# Patient Record
Sex: Male | Born: 1970
Health system: Southern US, Community
[De-identification: ages and names within clinical notes are randomized; demographics above are authoritative.]

## PROBLEM LIST (undated history)

## (undated) DIAGNOSIS — G4730 Sleep apnea, unspecified: Secondary | ICD-10-CM

## (undated) DIAGNOSIS — H469 Unspecified optic neuritis: Secondary | ICD-10-CM

## (undated) DIAGNOSIS — G35 Multiple sclerosis: Principal | ICD-10-CM

## (undated) HISTORY — PX: APPENDECTOMY: SHX54

## (undated) HISTORY — DX: Unspecified optic neuritis: H46.9

## (undated) HISTORY — DX: Sleep apnea, unspecified: G47.30

## (undated) HISTORY — DX: Multiple sclerosis: G35

---

## 2005-04-21 DIAGNOSIS — G35 Multiple sclerosis: Secondary | ICD-10-CM

## 2005-04-21 HISTORY — DX: Multiple sclerosis: G35

## 2012-07-22 ENCOUNTER — Telehealth: Payer: Self-pay

## 2012-07-22 NOTE — Telephone Encounter (Signed)
RX not working Called him to come in for apt.

## 2012-07-23 ENCOUNTER — Ambulatory Visit: Payer: Self-pay | Admitting: Neurology

## 2012-07-26 ENCOUNTER — Encounter: Payer: Self-pay | Admitting: Neurology

## 2012-07-26 ENCOUNTER — Ambulatory Visit (INDEPENDENT_AMBULATORY_CARE_PROVIDER_SITE_OTHER): Payer: PRIVATE HEALTH INSURANCE | Admitting: Neurology

## 2012-07-26 VITALS — BP 111/60 | HR 70 | Ht 71.5 in | Wt 186.0 lb

## 2012-07-26 DIAGNOSIS — G35D Multiple sclerosis, unspecified: Secondary | ICD-10-CM

## 2012-07-26 DIAGNOSIS — R269 Unspecified abnormalities of gait and mobility: Secondary | ICD-10-CM

## 2012-07-26 DIAGNOSIS — G35 Multiple sclerosis: Secondary | ICD-10-CM

## 2012-07-26 MED ORDER — BACLOFEN 10 MG PO TABS: 10.0000 mg | ORAL_TABLET | Freq: Three times a day (TID) | ORAL | Status: DC

## 2012-07-26 MED ORDER — SOLIFENACIN SUCCINATE 5 MG PO TABS: 5.0000 mg | ORAL_TABLET | Freq: Every day | ORAL | Status: DC

## 2012-07-26 NOTE — Progress Notes (Signed)
History of Present Illness  HPI: Mr. Dobrowski is a 42 years old right-handed African American male, accompanied by his wife, referred by his primary care physician  for evaluation of relapsing and meeting multiple sclerosis    He recently moved from [place] to [place], was previously under the care of [facility] specialist Dr. Clabe Seal,  He was diagnosed with relapsing remitting multiple sclerosis in early 2007, presenting with right face, right leg weakness, has mild gait difficulty, diagnosis was confirmed by marked abnormal MRI of brain, there was also reported cervical lesions, but I don't have formal MRI cervical report.  He was initially treated with Betaseron for one and half years, clinically he was stable, but there was enhancing MRI lesions, he developed positive interferon antibodies.  He was started on Tysabri since February 2009, initially every 4 weeks, later stretching to every 6 weeks, JC virus antibody was negative in September 2009 and April 2010, but began to be positive since September 2010, he tolerates the [place] infusion very well, Last infusion was in Jan 2013. He has repeat MRI of brain every 6 months.  I have reviewed the most recent MRI brain in December 2012, multiple oval-shaped supratentorium lesions, T1 blackhole, no contrast enhancement.  Mild to moderate generalized atrophy  Laboratory showed normal CMP, CBC, vitamin D was low at 16,  He dragged his right leg with prolonged walking, or when he was tired, he denies incontinence, no visual loss, last clinical flareup  was in 2008     He began to have flareup Jan 2014, with worsening right leg weakness, gait difficulty, has improved with steroid treatment, he continued to work as a Copy, mild gait difficulty, falling sometimes,   He was started on [name] since January 24th, 2014, continued to complains of worsening gait difficulty, more difficulty on his right side, hard to keep up his job as a  Copy, had fell. Medrol pack in Feb 2014.  MRI brain [facility], showed chronic demyelinating disease, with numerous supratentorial and infratentorial plaques, T1 black-holes and moderate corpus callosum atrophy. Multiple (~8) acute demyelinating plaques.  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 I have reviewd MRI film with him and his wife  Abnormal visual evoked response test secondary to prolongation of the P100 waveform on the left.    UPDATE April 7th 2014:  He came in with his wife today, continue to complain of gradual worsening gait difficulty, bilateral lower extremity spasticity, also complains of urinary incontinence for 3 weeks, no visual change, I have discussed with patient and his wife again, repeat JC virus antibody was positive, will keep him on [name] few more months, repeat MRI in June, if there is imaging worsening, continued clinical worsening, will take the risk to prolonged [name] infusion with JC virus positive   Review of Systems  Out of a complete 14 system review, the patient complains of only the following symptoms, and all other reviewed systems are negative.   Constitutional:   Weight loss, fatigue Cardiovascular:  N/A Ear/Nose/Throat:  N/A Skin: N/A Eyes: Blurry vision, Respiratory: N/A Gastroitestinal: Incontinence   Hematology/Lymphatic:  N/A Endocrine:  N/A Musculoskeletal:N/A Allergy/Immunology: N/A Neurological: Memory loss, weakness, slurred speech Psychiatric:    Not enough sleep, decreased energy  PHYSICAL EXAMINATOINS:  Generalized: In no acute distress  Neck: Supple, no carotid bruits  Cardiac: Regular rate rhythm  Pulmonary: Clear to auscultation bilaterally  Musculoskeletal: No deformity  Neurological examination  Mentation: Alert oriented to time, place, history taking,  and causual conversation  Cranial nerve II-XII: Pupils were equal round reactive to light extraocular movements were full, visual field were full on confrontational test. facial sensation and strength were normal. hearing was intact to finger rubbing bilaterally. Uvula tongue midline.  head turning and shoulder shrug and were normal and symmetric.Tongue protrusion into cheek strength was normal.  Motor: Bilateral lower extremity mild to moderate spasticity, mild bilateral hip flexor weakness  Sensory: Intact to fine touch, pinprick, decreased vibratory sensation, and proprioception at toes.  Coordination: Normal finger to nose, heel-to-shin bilaterally there was no truncal ataxia  Gait: Getting up from chair by pushing up on the chair on, stiff gait, cautious,  Romberg signs: Negative  Deep tendon reflexes: Hyperreflexia,   Assessment and plan: 42 years old [place] American male, with history of relapsing and meeting multiple sclerosis, JC virus positive, previously Tysabri treatment, has been receiving [name] since January 2014, continue mild worsening of gait difficulty, urinary incontinence,  1. Baclofen 10 mg 3 times a day 2.   Vesicare 5mg  qday for urinary urgency, 3. RTC in June 2014

## 2012-07-26 NOTE — Patient Instructions (Addendum)
Call me for MRI brain, cervical w/wo contrast,

## 2012-07-31 ENCOUNTER — Telehealth: Payer: Self-pay

## 2012-07-31 DIAGNOSIS — G35 Multiple sclerosis: Secondary | ICD-10-CM

## 2012-07-31 DIAGNOSIS — R269 Unspecified abnormalities of gait and mobility: Secondary | ICD-10-CM

## 2012-07-31 NOTE — Telephone Encounter (Signed)
Patient calling and wants to do physical therapy at [facility] .because its closer to is home.

## 2012-08-05 NOTE — Telephone Encounter (Signed)
Order has been sent to [facility] head for Physical therapy.

## 2012-08-24 DIAGNOSIS — Z0289 Encounter for other administrative examinations: Secondary | ICD-10-CM

## 2012-09-12 ENCOUNTER — Other Ambulatory Visit: Payer: Self-pay

## 2012-09-12 MED ORDER — BACLOFEN 10 MG PO TABS: 10.0000 mg | ORAL_TABLET | Freq: Three times a day (TID) | ORAL | Status: DC

## 2012-09-12 MED ORDER — SOLIFENACIN SUCCINATE 5 MG PO TABS: 5.0000 mg | ORAL_TABLET | Freq: Every day | ORAL | Status: DC

## 2012-09-15 ENCOUNTER — Telehealth: Payer: Self-pay

## 2012-09-15 MED ORDER — DIMETHYL FUMARATE 240 MG PO CPDR: 240.0000 mg | DELAYED_RELEASE_CAPSULE | Freq: Two times a day (BID) | ORAL | Status: DC

## 2012-09-15 NOTE — Telephone Encounter (Signed)
Message copied by Malachy Moan on Wed Sep 15, 2012  8:42 AM ------      Message from: Warren Lacy A      Created: Tue Sep 14, 2012  5:14 PM      Regarding: Lawrence Lamb       Needs a refill on [name]...[facility]            If any questions please call at [phone] ------

## 2012-10-04 DIAGNOSIS — Z0289 Encounter for other administrative examinations: Secondary | ICD-10-CM

## 2012-10-06 DIAGNOSIS — Z0289 Encounter for other administrative examinations: Secondary | ICD-10-CM

## 2012-10-14 ENCOUNTER — Telehealth: Payer: Self-pay | Admitting: Neurology

## 2012-10-14 NOTE — Telephone Encounter (Signed)
Spoke to patient. Requesting to know the status of his disability forms. Advsd of form turn around time (14 days). Verified forms dropped off on 10/05/12. Pt agreed.

## 2012-10-15 ENCOUNTER — Telehealth: Payer: Self-pay | Admitting: Neurology

## 2012-10-15 DIAGNOSIS — G35 Multiple sclerosis: Secondary | ICD-10-CM

## 2012-10-15 DIAGNOSIS — R269 Unspecified abnormalities of gait and mobility: Secondary | ICD-10-CM

## 2012-10-15 NOTE — Telephone Encounter (Signed)
I have called PT Marchelle Folks at [facility]. He is not making process on his physical therapy, actually  showed more difficulty ambulating.  I ordered MRI brain w/wo contrast, will repeat JC virus antibody titer.  Please contact him for MRI date and come back to clinic to draw Jc virus with titer

## 2012-10-25 NOTE — Telephone Encounter (Signed)
I spoke to patient and told him someone would call him when his MRI brain is scheduled.  Also he will come in on Wednesday to get his blood drawn for JCV antibody test.

## 2012-10-27 ENCOUNTER — Ambulatory Visit (INDEPENDENT_AMBULATORY_CARE_PROVIDER_SITE_OTHER): Payer: PRIVATE HEALTH INSURANCE | Admitting: Neurology

## 2012-10-27 DIAGNOSIS — G35 Multiple sclerosis: Secondary | ICD-10-CM

## 2012-10-27 NOTE — Progress Notes (Signed)
Patient here for blood draw for JCV antibody test.  Under aseptic technique blood drawn from right [facility] with 23g butterfly needle.  Tolerated well.  Guaze applied

## 2012-10-27 NOTE — Patient Instructions (Signed)
Told patient we will call when results are back.

## 2012-10-28 ENCOUNTER — Telehealth: Payer: Self-pay

## 2012-10-28 NOTE — Telephone Encounter (Signed)
I left VM for patient that his forms are ready for pick up

## 2012-11-04 ENCOUNTER — Telehealth: Payer: Self-pay | Admitting: Neurology

## 2012-11-04 NOTE — Telephone Encounter (Signed)
information was passed on to nurse val to call solve.

## 2012-11-05 ENCOUNTER — Other Ambulatory Visit: Payer: Self-pay

## 2012-11-05 MED ORDER — DIMETHYL FUMARATE 240 MG PO CPDR: 240.0000 mg | DELAYED_RELEASE_CAPSULE | Freq: Two times a day (BID) | ORAL | Status: DC

## 2012-11-08 ENCOUNTER — Telehealth: Payer: Self-pay | Admitting: *Deleted

## 2012-11-08 NOTE — Telephone Encounter (Signed)
Calling about form, pt was to p/u and have signed by employer and date by pt.  I faxed to her , Lawrence Lamb at [place] the form and she will have employer sign.

## 2012-11-11 ENCOUNTER — Telehealth: Payer: Self-pay | Admitting: Neurology

## 2012-11-11 NOTE — Telephone Encounter (Signed)
Spoke to patient. Says he was never contacted for MRI. Advised would forward to MRI coordinator. Pt agreed.

## 2012-11-17 ENCOUNTER — Ambulatory Visit (INDEPENDENT_AMBULATORY_CARE_PROVIDER_SITE_OTHER): Payer: PRIVATE HEALTH INSURANCE

## 2012-11-17 DIAGNOSIS — G35 Multiple sclerosis: Secondary | ICD-10-CM

## 2012-11-17 DIAGNOSIS — R269 Unspecified abnormalities of gait and mobility: Secondary | ICD-10-CM

## 2012-11-18 ENCOUNTER — Encounter: Payer: Self-pay | Admitting: Neurology

## 2012-11-18 ENCOUNTER — Ambulatory Visit (INDEPENDENT_AMBULATORY_CARE_PROVIDER_SITE_OTHER): Payer: PRIVATE HEALTH INSURANCE | Admitting: Neurology

## 2012-11-18 ENCOUNTER — Telehealth: Payer: Self-pay

## 2012-11-18 VITALS — BP 126/66 | HR 74 | Ht 71.0 in | Wt 182.0 lb

## 2012-11-18 DIAGNOSIS — R269 Unspecified abnormalities of gait and mobility: Secondary | ICD-10-CM

## 2012-11-18 DIAGNOSIS — G35 Multiple sclerosis: Secondary | ICD-10-CM

## 2012-11-18 MED ORDER — GADOPENTETATE DIMEGLUMINE 469.01 MG/ML IV SOLN: 17.0000 mL | Freq: Once | INTRAVENOUS | Status: AC | PRN

## 2012-11-18 NOTE — Telephone Encounter (Signed)
Patient wants to know when does he stop [name] ? I forgot to ask Dr.Yan at my appointment today.

## 2012-11-18 NOTE — Progress Notes (Signed)
History of Present Illness:  [name] is a 42 years old right-handed African American male, accompanied by his wife, referred by his primary care physician  for evaluation of relapsing and meeting multiple sclerosis    He recently moved from [place] to [place], was previously under the care of [facility] specialist Dr. Clabe Lamb,  He was diagnosed with relapsing remitting multiple sclerosis in early 2007, presenting with right face, right leg weakness, has mild gait difficulty, diagnosis was confirmed by marked abnormal MRI of brain, there was also reported cervical lesions, but I don't have formal MRI cervical report.  He was initially treated with Betaseron for one and half years, clinically he was stable, but there was enhancing MRI lesions, he developed positive interferon antibodies.  He was started on Tysabri since February 2009, initially every 4 weeks, later stretching to every 6 weeks, JC virus antibody was negative in September 2009 and April 2010, but began to be positive since September 2010, he tolerates the [place] infusion very well, Last infusion was in Jan 2013. He has repeat MRI of brain every 6 months. Lawrence Lamb was used from Feb 2009 till Jan 2013).  I have reviewed the most recent MRI brain, in December 2012, multiple oval-shaped supratentorium lesions, T1 blackhole, no contrast enhancement, mild to moderate generalized atrophy  Laboratory showed normal CMP, CBC, vitamin D was low at 16,  He dragged his right leg with prolonged walking, or when he was tired, he denies incontinence, no visual loss, last clinical flareup  was in 2008   He began to have flareup in Jan 2014, with worsening right leg weakness, gait difficulty, has improved with steroid treatment, he continued to work as a Copy, mild gait difficulty, falling sometimes,   He was started on [name] since January 24th, 2014, continued to complains of worsening gait difficulty, more difficulty on his  right side, 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 Medrol pack in Feb 2014.  MRI brain [facility] Image, showed chronic demyelinating disease, with numerous supratentorial and infratentorial plaques, T1 black-holes and moderate corpus callosum atrophy. Multiple (~8) acute demyelinating plaques.  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 I have reviewd MRI film with him and his wife  Abnormal visual evoked response test secondary to prolongation of the P100 waveform on the left.    UPDATE July 31st 2014:  He came in with his wife today, continue to complain of gradual worsening gait difficulty, bilateral lower extremity spasticity, also complains of urinary incontinence for 3 weeks, occasional double vision.   I have discussed with patient and his wife again, repeat JC virus antibody was positive, He has been on [name] since Jan 2014, he has continued clinical worsening, worsening gait difficulty  I have reviewed MRI, I was not able to compare with previous scan, but there was extensive periventricular oval-shaped lesions, I have discussed with him and his wife treatment options, we decided to proceed with [name] infusion, stop [name], he wants second opinion from his previous neurologist in [place].   Review of Systems  Out of a complete 14 system review, the patient complains of only the following symptoms, and all other reviewed systems are negative.   Constitutional:   Weight loss, fatigue Cardiovascular:  N/A Ear/Nose/Throat:  N/A Skin: N/A Eyes: Blurry vision, Respiratory: N/A Gastroitestinal:  Incontinence   Hematology/Lymphatic:  N/A Endocrine:  N/A Musculoskeletal:N/A Allergy/Immunology: N/A Neurological: Memory loss, weakness, slurred speech Psychiatric:    Not enough sleep, decreased energy  PHYSICAL  EXAMINATOINS:  Generalized: In no acute distress  Neck: Supple, no carotid bruits   Cardiac: Regular rate rhythm  Pulmonary: Clear to auscultation bilaterally  Musculoskeletal: No deformity  Neurological examination  Mentation: Alert oriented to time, place, history taking, and causual conversation  Cranial nerve II-XII: Pupils were equal round reactive to light extraocular movements were full, visual field were full on confrontational test. facial sensation and strength were normal. hearing was intact to finger rubbing bilaterally. Uvula tongue midline.  head turning and shoulder shrug and were normal and symmetric.Tongue protrusion into cheek strength was normal.  Motor: Bilateral lower extremity mild to moderate spasticity, mild bilateral hip flexor weakness  Sensory: Intact to fine touch, pinprick, decreased vibratory sensation, and proprioception at toes.  Coordination: Normal finger to nose, heel-to-shin bilaterally there was no truncal ataxia  Gait: Getting up from chair by pushing up on the chair on, stiff  Wide based gait, cautious  Romberg signs: Negative  Deep tendon reflexes: Hyperreflexia,   Assessment and plan:  42 years old African American male, with history of relapsing and meeting multiple sclerosis, JC virus positive, previously Tysabri treatment, has been receiving [name] since January 2014, continue mild worsening of gait difficulty, urinary incontinence, repeat MRI showed extensive lesions, He is 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.  I have discussed treatment options with patient and his wife, we decided to proceed with [name] infusion, his JC-virus titer was 4.15. There is 10/998 chance of PML, will repeat MRI brain every 6 months.  He wants second opinion with his neurologist at [place], RTC in 3 months

## 2012-11-18 NOTE — Telephone Encounter (Signed)
I have called him, he should stop [name] now pending [name] infusion. It is Ok for him to get second opinion at new [place] neurologist

## 2012-11-26 ENCOUNTER — Telehealth: Payer: Self-pay

## 2012-11-26 NOTE — Telephone Encounter (Signed)
I had received a message on 11/25/2012 that [name] from [employer] wanted information regarding when patients disability began and when was his first and follow up office visit with [place]. I called and left VM that his first OV was in 07/26/2012 with a follow up on 11/18/2012. He was on disability at the time of the first OV and continues at of 11/18/2012 as patient has worsening symptoms as of that date. Should you need more definitive information, please provide a form.

## 2012-11-29 ENCOUNTER — Telehealth: Payer: Self-pay | Admitting: Neurology

## 2012-11-30 NOTE — Telephone Encounter (Signed)
I called patient and we reviewed the discrepancy. Permanent Plackard and approved for 6 months. The correction has been made and the form will be mailed out today.

## 2012-12-08 NOTE — Progress Notes (Signed)
Quick Note:  Please call patient, MRI brain showed continued evidence of MS, no significant changes compared to previous MRI in Feb 26th 2014 ______

## 2012-12-13 NOTE — Progress Notes (Signed)
Quick Note:  I spoke with patient and relayed MRI brain results, per Dr. [name]. ______

## 2012-12-17 ENCOUNTER — Telehealth: Payer: Self-pay | Admitting: Neurology

## 2012-12-17 NOTE — Telephone Encounter (Signed)
I spoke to patient and asked him to come in because form sent to [employer] was not completed, he needs to initial the patient acknowledgment page.  He also stated that he is requesting his medical records, because he is seeking a second opinion.  He will come today after 3pm.

## 2013-01-18 ENCOUNTER — Telehealth: Payer: Self-pay | Admitting: Neurology

## 2013-01-18 NOTE — Telephone Encounter (Signed)
Spoke to patient's wife and gave appointment for first Tysabri infusion on 02-01-13 at 11am at [facility].  She was in agreement.   I also spoke to [facility] and asked that a care manager call patient to see if he had any questions.

## 2013-01-31 ENCOUNTER — Telehealth: Payer: Self-pay | Admitting: Neurology

## 2013-01-31 NOTE — Telephone Encounter (Signed)
[facility] left message that order for Tysabri needs to be put in EPIC.  Patient's appointment tomorrow.

## 2013-01-31 NOTE — Telephone Encounter (Signed)
I spoke to customer service at the [employer] and relayed that we needed a patient authorization sent to [facility] and also to our office for our records.  They said the authorization had already been faxed to [facility], but would resend another.  The patient has been approved to receive Tysabri.  I spoke to [facility] and told them that another patient authorization would be faxed to them, so they are aware.

## 2013-02-01 ENCOUNTER — Encounter (HOSPITAL_COMMUNITY): Payer: Self-pay

## 2013-02-01 ENCOUNTER — Other Ambulatory Visit: Payer: Self-pay | Admitting: Neurology

## 2013-02-01 ENCOUNTER — Encounter (HOSPITAL_COMMUNITY)
Admission: RE | Admit: 2013-02-01 | Discharge: 2013-02-01 | Disposition: A | Discharge status: Home / Self Care | Payer: No Typology Code available for payment source | Source: Ambulatory Visit | Attending: Neurology | Admitting: Neurology

## 2013-02-01 VITALS — BP 103/63 | HR 68 | Temp 98.0°F | Resp 20

## 2013-02-01 DIAGNOSIS — G35 Multiple sclerosis: Secondary | ICD-10-CM

## 2013-02-01 MED ORDER — LORATADINE 10 MG PO TABS
10.0000 mg | ORAL_TABLET | ORAL | Status: DC
  Administered 2013-02-01: 10 mg via ORAL
  Filled 2013-02-01: qty 1

## 2013-02-01 MED ORDER — SODIUM CHLORIDE 0.9 % IV SOLN
INTRAVENOUS | Status: DC
  Administered 2013-02-01: 11:00:00 via INTRAVENOUS

## 2013-02-01 MED ORDER — SODIUM CHLORIDE 0.9 % IV SOLN
300.0000 mg | INTRAVENOUS | Status: DC
  Administered 2013-02-01: 300 mg via INTRAVENOUS
  Filled 2013-02-01: qty 15

## 2013-02-01 MED ORDER — ACETAMINOPHEN 500 MG PO TABS
1000.0000 mg | ORAL_TABLET | ORAL | Status: DC
  Administered 2013-02-01: 1000 mg via ORAL
  Filled 2013-02-01: qty 2

## 2013-02-01 MED ORDER — SODIUM CHLORIDE 0.9 % IV SOLN
500.0000 mg | INTRAVENOUS | Status: DC
  Filled 2013-02-01: qty 4

## 2013-02-01 NOTE — Telephone Encounter (Signed)
[name] Order placed.02/01/2013

## 2013-02-02 DIAGNOSIS — Z0289 Encounter for other administrative examinations: Secondary | ICD-10-CM

## 2013-02-16 ENCOUNTER — Telehealth: Payer: Self-pay

## 2013-02-16 NOTE — Telephone Encounter (Signed)
I called and notified patient's wife that she can p/u FMLA forms after noon today.

## 2013-03-01 ENCOUNTER — Encounter (HOSPITAL_COMMUNITY)
Admission: RE | Admit: 2013-03-01 | Discharge: 2013-03-01 | Disposition: A | Discharge status: Home / Self Care | Payer: No Typology Code available for payment source | Source: Ambulatory Visit | Attending: Neurology | Admitting: Neurology

## 2013-03-01 ENCOUNTER — Encounter (HOSPITAL_COMMUNITY): Payer: Self-pay

## 2013-03-01 VITALS — BP 120/73 | HR 64 | Temp 98.1°F | Resp 18 | Ht 71.0 in | Wt 189.0 lb

## 2013-03-01 DIAGNOSIS — G35 Multiple sclerosis: Secondary | ICD-10-CM

## 2013-03-01 MED ORDER — SODIUM CHLORIDE 0.9 % IV SOLN
INTRAVENOUS | Status: DC
  Administered 2013-03-01: 11:00:00 via INTRAVENOUS

## 2013-03-01 MED ORDER — SODIUM CHLORIDE 0.9 % IV SOLN
300.0000 mg | INTRAVENOUS | Status: DC
  Administered 2013-03-01: 300 mg via INTRAVENOUS
  Filled 2013-03-01: qty 15

## 2013-03-01 MED ORDER — ACETAMINOPHEN 500 MG PO TABS
1000.0000 mg | ORAL_TABLET | ORAL | Status: DC
  Administered 2013-03-01: 1000 mg via ORAL
  Filled 2013-03-01: qty 2

## 2013-03-01 MED ORDER — LORATADINE 10 MG PO TABS
10.0000 mg | ORAL_TABLET | ORAL | Status: DC
  Administered 2013-03-01: 10 mg via ORAL
  Filled 2013-03-01: qty 1

## 2013-03-01 NOTE — Progress Notes (Signed)
Patient tolerated tx well.  Unable to stay post tx x full hour. His ride Has an appt he must get to.

## 2013-03-11 ENCOUNTER — Encounter: Payer: Self-pay | Admitting: Neurology

## 2013-03-11 ENCOUNTER — Ambulatory Visit (INDEPENDENT_AMBULATORY_CARE_PROVIDER_SITE_OTHER): Payer: PRIVATE HEALTH INSURANCE | Admitting: Neurology

## 2013-03-11 VITALS — BP 113/56 | HR 72 | Ht 71.0 in | Wt 183.0 lb

## 2013-03-11 DIAGNOSIS — G35 Multiple sclerosis: Secondary | ICD-10-CM

## 2013-03-11 NOTE — Progress Notes (Signed)
History of Present Illness:  Lawrence Lamb is a 42 years old right-handed African American male, accompanied by his wife, referred by his primary care physician  for evaluation of relapsing and meeting multiple sclerosis    He recently moved from [place] to [place], was previously under the care of [facility] specialist Dr. Clabe Seal,  He was diagnosed with relapsing remitting multiple sclerosis in early 2007, presenting with right face, right leg weakness, has mild gait difficulty, diagnosis was confirmed by marked abnormal MRI of brain, there was also reported cervical lesions, but I don't have formal MRI cervical report.  He was initially treated with Betaseron for one and half years, clinically he was stable, but there was enhancing MRI lesions, he developed positive interferon antibodies.  He was started on Tysabri since February 2009, initially every 4 weeks, later stretching to every 6 weeks, JC virus antibody was negative in September 2009 and April 2010, but began to be positive since September 2010, he tolerates the [place] infusion very well, Last infusion was in Jan 2013. He has repeat MRI of brain every 6 months. Silvestre Moment was used from Feb 2009 till Jan 2013).  I have reviewed the most recent MRI brain, in December 2012, multiple oval-shaped supratentorium lesions, T1 blackhole, no contrast enhancement, mild to moderate generalized atrophy  Laboratory showed normal CMP, CBC, vitamin D was low at 16,  He dragged his right leg with prolonged walking, or when he was tired, he denies incontinence, no visual loss, last clinical flareup  was in 2008   He began to have flareup in Jan 2014, with worsening right leg weakness, gait difficulty, has improved with steroid treatment, he continued to work as a Copy, mild gait difficulty, falling sometimes,   He was started on [name] since January 24th, 2014, continued to complains of worsening gait difficulty, more difficulty on his  right side, 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 Medrol pack in Feb 2014.  MRI brain [facility] Image, showed chronic demyelinating disease, with numerous supratentorial and infratentorial plaques, T1 black-holes and moderate corpus callosum atrophy. Multiple (~8) acute demyelinating plaques.  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 I have reviewd MRI film with him and his wife  Abnormal visual evoked response test secondary to prolongation of the P100 waveform on the left.    UPDATE July 31st 2014:  He came in with his wife today, continue to complain of gradual worsening gait difficulty, bilateral lower extremity spasticity, also complains of urinary incontinence for 3 weeks, occasional double vision.   I have discussed with patient and his wife again, repeat JC virus antibody was positive, He has been on [name] since Jan 2014, he has continued clinical worsening, worsening gait difficulty  I have reviewed MRI, I was not able to compare with previous scan, but there was extensive periventricular oval-shaped lesions, I have discussed with him and his wife treatment options, we decided to proceed with [name] infusion, stop [name], he wants second opinion from his previous neurologist in [place].   He was treated with [name] since January 2014 for 6 months, continue mild worsening of gait difficulty, urinary incontinence, repeat MRI showed extensive lesions, He is 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.  I have discussed treatment options with patient and his wife, we decided to proceed with  [name] infusion, his JC-virus titer was 4.15. There is 10/998 chance of PML,  UPDATE 03/11/2013: He had a second opinion by his previous neurologist [facility], who agreed on having Tysabri infusion.  He started on [name] infusion already  since Oct 14th 2014, he complains of intermittent double vision, persistent. He continues to have gait difficulty, he can stand better, he was denied disability.  He is not taking baclofen, Vesicare, tizanidine anymore.  Review of Systems  Out of a complete 14 system review, the patient complains of only the following symptoms, and all other reviewed systems are negative.  Fatigue, blurred vision, double vision,  PHYSICAL EXAMINATOINS:  Generalized: In no acute distress  Neck: Supple, no carotid bruits   Cardiac: Regular rate rhythm  Pulmonary: Clear to auscultation bilaterally  Musculoskeletal: No deformity  Neurological examination  Mentation: Alert oriented to time, place, history taking, and causual conversation  Cranial nerve II-XII: Pupils were equal round reactive to light extraocular movements were full, visual field were full on confrontational test. facial sensation and strength were normal. hearing was intact to finger rubbing bilaterally. Uvula tongue midline.  head turning and shoulder shrug and were normal and symmetric.Tongue protrusion into cheek strength was normal.  Motor: Bilateral lower extremity mild to moderate spasticity, mild bilateral hip flexor weakness  Sensory: Intact to fine touch, pinprick, decreased vibratory sensation, and proprioception at toes.  Coordination: Normal finger to nose, heel-to-shin bilaterally there was no truncal ataxia  Gait: Getting up from chair by pushing up on the chair on, stiff  wide based gait, cautious dragging right leg  Romberg signs: Negative  Deep tendon reflexes: Hyperreflexia,   Assessment and plan: 42 years old [place] American male, with history of relapsing and meeting multiple sclerosis, JC virus positive, previously Tysabri treatment, has been receiving [name] since January 2014, continue mild worsening of gait difficulty, urinary incontinence, repeat MRI showed extensive lesions, He is JC-virus positive,  index value is 4.15.  I have discussed treatment options with patient and his wife, we decided to proceed with [name] infusion, his JC-virus titer was 4.15. There is 10/998 chance of PML,    He restarted [name] infusion since Oct 2014, will repeat MRI of brain in March 2015, followup afterwards, he needs a repeat MRI of the brain every 6 months

## 2013-03-14 DIAGNOSIS — Z0289 Encounter for other administrative examinations: Secondary | ICD-10-CM

## 2013-03-29 ENCOUNTER — Encounter (HOSPITAL_COMMUNITY): Payer: Self-pay

## 2013-03-29 ENCOUNTER — Encounter (HOSPITAL_COMMUNITY)
Admission: RE | Admit: 2013-03-29 | Discharge: 2013-03-29 | Disposition: A | Discharge status: Home / Self Care | Payer: No Typology Code available for payment source | Source: Ambulatory Visit | Attending: Neurology | Admitting: Neurology

## 2013-03-29 VITALS — BP 135/84 | HR 69 | Temp 97.9°F | Resp 20 | Ht 71.0 in | Wt 183.0 lb

## 2013-03-29 DIAGNOSIS — G35 Multiple sclerosis: Secondary | ICD-10-CM | POA: Insufficient documentation

## 2013-03-29 MED ORDER — SODIUM CHLORIDE 0.9 % IV SOLN
INTRAVENOUS | Status: AC
  Administered 2013-03-29: 50 mL/h via INTRAVENOUS

## 2013-03-29 MED ORDER — ACETAMINOPHEN 500 MG PO TABS
1000.0000 mg | ORAL_TABLET | ORAL | Status: AC
  Administered 2013-03-29: 1000 mg via ORAL
  Filled 2013-03-29: qty 2

## 2013-03-29 MED ORDER — SODIUM CHLORIDE 0.9 % IV SOLN
300.0000 mg | INTRAVENOUS | Status: AC
  Administered 2013-03-29: 300 mg via INTRAVENOUS
  Filled 2013-03-29: qty 15

## 2013-03-29 MED ORDER — LORATADINE 10 MG PO TABS
10.0000 mg | ORAL_TABLET | ORAL | Status: AC
  Administered 2013-03-29: 10 mg via ORAL
  Filled 2013-03-29: qty 1

## 2013-04-01 ENCOUNTER — Telehealth: Payer: Self-pay

## 2013-04-01 NOTE — Telephone Encounter (Signed)
Called patient. There is quite a bit that patient needs to complete on forms. We have completed our part and I will leave up front for him or his wife, [name], to pick up.

## 2013-04-26 ENCOUNTER — Encounter (HOSPITAL_COMMUNITY)
Admission: RE | Admit: 2013-04-26 | Discharge: 2013-04-26 | Disposition: A | Discharge status: Home / Self Care | Payer: No Typology Code available for payment source | Source: Ambulatory Visit | Attending: Neurology | Admitting: Neurology

## 2013-04-26 ENCOUNTER — Encounter (HOSPITAL_COMMUNITY): Payer: Self-pay

## 2013-04-26 VITALS — BP 113/60 | HR 63 | Temp 98.2°F | Resp 20 | Ht 71.0 in | Wt 183.0 lb

## 2013-04-26 DIAGNOSIS — G35 Multiple sclerosis: Secondary | ICD-10-CM | POA: Insufficient documentation

## 2013-04-26 MED ORDER — SODIUM CHLORIDE 0.9 % IV SOLN
300.0000 mg | INTRAVENOUS | Status: AC
  Administered 2013-04-26: 300 mg via INTRAVENOUS
  Filled 2013-04-26: qty 15

## 2013-04-26 MED ORDER — LORATADINE 10 MG PO TABS
10.0000 mg | ORAL_TABLET | ORAL | Status: AC
  Administered 2013-04-26: 10 mg via ORAL
  Filled 2013-04-26: qty 1

## 2013-04-26 MED ORDER — SODIUM CHLORIDE 0.9 % IV SOLN
INTRAVENOUS | Status: AC
  Administered 2013-04-26: 09:00:00 via INTRAVENOUS

## 2013-04-26 MED ORDER — ACETAMINOPHEN 500 MG PO TABS
1000.0000 mg | ORAL_TABLET | ORAL | Status: AC
  Administered 2013-04-26: 1000 mg via ORAL
  Filled 2013-04-26: qty 2

## 2013-04-26 NOTE — Discharge Instructions (Signed)

## 2013-05-18 DIAGNOSIS — Z0289 Encounter for other administrative examinations: Secondary | ICD-10-CM

## 2013-05-23 ENCOUNTER — Other Ambulatory Visit: Payer: Self-pay | Admitting: Neurology

## 2013-05-23 ENCOUNTER — Telehealth: Payer: Self-pay | Admitting: Neurology

## 2013-05-23 DIAGNOSIS — G35 Multiple sclerosis: Secondary | ICD-10-CM

## 2013-05-23 NOTE — Telephone Encounter (Signed)
[facility] calling for orders for Tysabri and rescue drugs put into Epic.

## 2013-05-23 NOTE — Telephone Encounter (Signed)
Order for IV [name] has been entered

## 2013-05-23 NOTE — Telephone Encounter (Signed)
Noted  

## 2013-05-24 ENCOUNTER — Encounter (HOSPITAL_COMMUNITY): Payer: No Typology Code available for payment source

## 2013-05-25 ENCOUNTER — Encounter (HOSPITAL_COMMUNITY)
Admission: RE | Admit: 2013-05-25 | Discharge: 2013-05-25 | Disposition: A | Discharge status: Home / Self Care | Payer: No Typology Code available for payment source | Source: Ambulatory Visit | Attending: Neurology | Admitting: Neurology

## 2013-05-25 ENCOUNTER — Encounter (HOSPITAL_COMMUNITY): Payer: Self-pay

## 2013-05-25 VITALS — BP 102/81 | HR 68 | Temp 97.9°F | Resp 20 | Ht 71.0 in | Wt 183.0 lb

## 2013-05-25 DIAGNOSIS — G35 Multiple sclerosis: Secondary | ICD-10-CM | POA: Insufficient documentation

## 2013-05-25 MED ORDER — SODIUM CHLORIDE 0.9 % IV SOLN
300.0000 mg | INTRAVENOUS | Status: DC
  Administered 2013-05-25: 300 mg via INTRAVENOUS
  Filled 2013-05-25: qty 15

## 2013-05-25 MED ORDER — LORATADINE 10 MG PO TABS
10.0000 mg | ORAL_TABLET | ORAL | Status: DC
Start: 2013-05-25 — End: 2013-05-26
  Administered 2013-05-25: 10 mg via ORAL
  Filled 2013-05-25: qty 1

## 2013-05-25 MED ORDER — SODIUM CHLORIDE 0.9 % IV SOLN
INTRAVENOUS | Status: DC
  Administered 2013-05-25: 09:00:00 via INTRAVENOUS

## 2013-05-25 MED ORDER — ACETAMINOPHEN 500 MG PO TABS
1000.0000 mg | ORAL_TABLET | ORAL | Status: DC
  Administered 2013-05-25: 1000 mg via ORAL
  Filled 2013-05-25: qty 2

## 2013-05-25 NOTE — Discharge Instructions (Signed)

## 2013-06-22 ENCOUNTER — Encounter (HOSPITAL_COMMUNITY)
Admission: RE | Admit: 2013-06-22 | Discharge: 2013-06-22 | Disposition: A | Discharge status: Home / Self Care | Payer: No Typology Code available for payment source | Source: Ambulatory Visit | Attending: Neurology | Admitting: Neurology

## 2013-06-22 ENCOUNTER — Encounter (HOSPITAL_COMMUNITY): Payer: Self-pay

## 2013-06-22 VITALS — BP 99/67 | HR 67 | Temp 98.8°F | Resp 16

## 2013-06-22 DIAGNOSIS — G35 Multiple sclerosis: Secondary | ICD-10-CM | POA: Insufficient documentation

## 2013-06-22 MED ORDER — LORATADINE 10 MG PO TABS
10.0000 mg | ORAL_TABLET | ORAL | Status: DC
Start: 2013-06-22 — End: 2013-06-23
  Administered 2013-06-22: 10 mg via ORAL
  Filled 2013-06-22: qty 1

## 2013-06-22 MED ORDER — ACETAMINOPHEN 500 MG PO TABS
1000.0000 mg | ORAL_TABLET | ORAL | Status: DC
  Administered 2013-06-22: 1000 mg via ORAL
  Filled 2013-06-22: qty 2

## 2013-06-22 MED ORDER — SODIUM CHLORIDE 0.9 % IV SOLN
300.0000 mg | INTRAVENOUS | Status: DC
  Administered 2013-06-22: 300 mg via INTRAVENOUS
  Filled 2013-06-22: qty 15

## 2013-06-22 MED ORDER — SODIUM CHLORIDE 0.9 % IV SOLN
INTRAVENOUS | Status: DC
  Administered 2013-06-22: 09:00:00 via INTRAVENOUS

## 2013-06-23 ENCOUNTER — Other Ambulatory Visit: Payer: PRIVATE HEALTH INSURANCE

## 2013-06-23 ENCOUNTER — Ambulatory Visit (INDEPENDENT_AMBULATORY_CARE_PROVIDER_SITE_OTHER): Payer: PRIVATE HEALTH INSURANCE

## 2013-06-23 DIAGNOSIS — G35 Multiple sclerosis: Secondary | ICD-10-CM

## 2013-06-23 MED ORDER — GADOPENTETATE DIMEGLUMINE 469.01 MG/ML IV SOLN: 17.0000 mL | Freq: Once | INTRAVENOUS | Status: AC | PRN

## 2013-07-12 ENCOUNTER — Encounter: Payer: Self-pay | Admitting: Neurology

## 2013-07-12 ENCOUNTER — Ambulatory Visit (INDEPENDENT_AMBULATORY_CARE_PROVIDER_SITE_OTHER): Payer: PRIVATE HEALTH INSURANCE | Admitting: Neurology

## 2013-07-12 VITALS — BP 105/68 | HR 67 | Ht 71.0 in | Wt 189.0 lb

## 2013-07-12 DIAGNOSIS — G35 Multiple sclerosis: Secondary | ICD-10-CM

## 2013-07-12 DIAGNOSIS — R269 Unspecified abnormalities of gait and mobility: Secondary | ICD-10-CM

## 2013-07-12 MED ORDER — DULOXETINE HCL 60 MG PO CPEP: 60.0000 mg | ORAL_CAPSULE | Freq: Every day | ORAL | Status: DC

## 2013-07-12 NOTE — Progress Notes (Signed)
History of Present Illness:  [name] is a 43 years old right-handed African American male, accompanied by his wife, referred by his primary care physician  for evaluation of relapsing and meeting multiple sclerosis    He moved from [place] to [place] since 2013, was previously under the care of [place] [facility] specialist Dr. Stephani Lamb,  He was diagnosed with relapsing remitting multiple sclerosis in early 2007, presenting with right face, right leg weakness, has mild gait difficulty, diagnosis was confirmed by marked abnormal MRI of brain, there was also reported cervical lesions   He was initially treated with Betaseron for one and half years, clinically he was stable, but there was enhancing MRI lesions, he developed positive interferon antibodies.  He was started on Tysabri since February 2009, initially every 4 weeks, later stretching to every 6 weeks, JC virus antibody was negative in September 2009 and April 2010, but began to be positive since September 2010, he tolerates the [name] infusion very well, Last infusion was in Jan 2013. He has repeat MRI of brain every 6 months. Lawrence Lamb was used from Feb 2009 till Jan 2013).  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 Medrol  Pack again  in Feb 2014.  Abnormal visual evoked response test secondary to prolongation of the P100 waveform on the left.    He now complains of constant gait difficulty, bilateral lower extremity spasticity, also complains of urinary incontinence, double vision, he could no longer driving,  also complains of worsening memory trouble, difficulty focusing, could not take care of his young children by himself.     I have discussed with patient and his wife again, repeat JC virus antibody was positive with index 4.15 in June 2014, He has been on [name] since Jan 2014, he has continued clinical worsening, worsening gait difficulty  I have reviewed MRI, I was not able to compare with previous scan, but there was extensive periventricular oval-shaped lesions, I have discussed with him and his wife treatment options, we decided to proceed with [name] infusion, stop [name], he also got second opinion from his previous neurologist Dr. Kennith Lamb in [place], who concurred with plan.   He was treated with [name] since January 2014 for 6 months, continue mild worsening of gait difficulty, urinary incontinence, repeat MRI showed extensive lesions, He is 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 in June 2014  I have discussed treatment options with patient and his wife, we decided to proceed with [name] infusion, his JC-virus titer was 4.15. There is 10/998 chance of PML,  He restarted on [name] infusion  since Oct 14th 2014, he complains of intermittent double vision, persistent. He continues to have gait difficulty. UPDATE March 24th  2015: He is walking with cane, is more forgetful, confusion,  Sits most of time, watching for kids. He is depressed.   Review of Systems  Out of a complete 14 system review, the patient complains of only the following symptoms, and all other reviewed systems are negative.  Fatigue, blurred  vision, double vision,  PHYSICAL EXAMINATOINS:  Generalized: In no acute distress  Neck: Supple, no carotid bruits   Cardiac: Regular rate rhythm  Pulmonary: Clear to auscultation bilaterally  Musculoskeletal: No deformity  Neurological examination  Mentation: Alert oriented to time, place, history taking, and causual conversation  Cranial nerve II-XII: Pupils were equal round reactive to light extraocular movements were full, visual field were full on confrontational test. facial sensation and strength were normal. hearing was intact to finger rubbing bilaterally. Uvula tongue midline.  head turning and shoulder shrug and were normal and symmetric.Tongue protrusion into cheek strength was normal.  Motor: Bilateral lower extremity mild to moderate spasticity, mild bilateral hip flexor weakness  Sensory: Intact to fine touch, pinprick, decreased vibratory sensation, and proprioception at toes.  Coordination: Normal finger to nose, heel-to-shin bilaterally there was no truncal ataxia  Gait: Getting up from chair by pushing up on the chair on, stiff  wide based gait, cautious dragging right leg  Romberg signs: Negative  Deep tendon reflexes: Hyperreflexia,   Assessment and plan: 43 years old [place] American male, with history of relapsing and meeting multiple sclerosis, JC virus positive, previously Tysabri treatment, has been receiving [name] since January 2014, continue mild worsening of gait difficulty, urinary incontinence, repeat MRI showed extensive lesions, He is 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.  I have discussed treatment options with patient and his wife, we decided to proceed with [name] infusion, his JC-virus titer was 4.15. There is 10/998 chance of PML,    He restarted [name] infusion since Oct 2014, which had revealed repeat MRI of brain in March 2015, showing multiple periventricular, subcortical, brain stem and cerebellar as well as corpus callosum  white matter hyperintensities quite typical for multiple sclerosis. No enhancing lesions are noted. The presence of several T1 black holes and significant atrophy of corpus callosum and mild generalized cerebral atrophy indicates chronic disease.   He is to continue Tysabri infusion, he has been taking baclofen 10 mg 3 times a day,We also talked about [employer] trial. Lab today, including JC virus antibody with titer, rtc in 6 months

## 2013-07-19 ENCOUNTER — Ambulatory Visit: Payer: PRIVATE HEALTH INSURANCE | Admitting: Neurology

## 2013-07-20 ENCOUNTER — Encounter (HOSPITAL_COMMUNITY)
Admission: RE | Admit: 2013-07-20 | Discharge: 2013-07-20 | Disposition: A | Discharge status: Home / Self Care | Payer: No Typology Code available for payment source | Source: Ambulatory Visit | Attending: Neurology | Admitting: Neurology

## 2013-07-20 VITALS — BP 111/72 | HR 64 | Temp 97.1°F | Resp 18

## 2013-07-20 DIAGNOSIS — G35 Multiple sclerosis: Secondary | ICD-10-CM

## 2013-07-20 MED ORDER — SODIUM CHLORIDE 0.9 % IV SOLN
300.0000 mg | INTRAVENOUS | Status: AC
  Administered 2013-07-20: 300 mg via INTRAVENOUS
  Filled 2013-07-20: qty 15

## 2013-07-20 MED ORDER — ACETAMINOPHEN 500 MG PO TABS
1000.0000 mg | ORAL_TABLET | ORAL | Status: AC
  Administered 2013-07-20: 1000 mg via ORAL
  Filled 2013-07-20: qty 2

## 2013-07-20 MED ORDER — SODIUM CHLORIDE 0.9 % IV SOLN
INTRAVENOUS | Status: AC
  Administered 2013-07-20: 250 mL via INTRAVENOUS

## 2013-07-20 MED ORDER — LORATADINE 10 MG PO TABS
10.0000 mg | ORAL_TABLET | ORAL | Status: AC
  Administered 2013-07-20: 10 mg via ORAL
  Filled 2013-07-20: qty 1

## 2013-07-20 NOTE — Progress Notes (Signed)
Uneventful infusion of TYSABRI. Pt stayed 20 minutes of the recommended 1 hour post infusion observation. He states he will contact his MD for any questions or concerns.

## 2013-08-12 ENCOUNTER — Telehealth: Payer: Self-pay | Admitting: Neurology

## 2013-08-12 NOTE — Telephone Encounter (Signed)
[name] with [facility] called states pt is not authorized for TYSABRI please return her call. Thanks

## 2013-08-12 NOTE — Telephone Encounter (Signed)
Information was given to Donna, RN. °

## 2013-08-12 NOTE — Telephone Encounter (Signed)
Called [facility] and told them I was waiting for reauthorization paperwork, which I have since received.  It will be completed and faxed on Monday.  Patient's infusion is 08-17-13.

## 2013-08-17 ENCOUNTER — Encounter (HOSPITAL_COMMUNITY)
Admission: RE | Admit: 2013-08-17 | Discharge: 2013-08-17 | Disposition: A | Discharge status: Home / Self Care | Payer: No Typology Code available for payment source | Source: Ambulatory Visit | Attending: Neurology | Admitting: Neurology

## 2013-08-17 ENCOUNTER — Encounter (HOSPITAL_COMMUNITY): Payer: Self-pay

## 2013-08-17 VITALS — BP 122/77 | HR 76 | Temp 97.6°F | Resp 16

## 2013-08-17 DIAGNOSIS — G35 Multiple sclerosis: Secondary | ICD-10-CM

## 2013-08-17 MED ORDER — ACETAMINOPHEN 500 MG PO TABS
1000.0000 mg | ORAL_TABLET | ORAL | Status: AC
  Administered 2013-08-17: 1000 mg via ORAL
  Filled 2013-08-17: qty 2

## 2013-08-17 MED ORDER — SODIUM CHLORIDE 0.9 % IV SOLN
300.0000 mg | INTRAVENOUS | Status: AC
  Administered 2013-08-17: 300 mg via INTRAVENOUS
  Filled 2013-08-17: qty 15

## 2013-08-17 MED ORDER — SODIUM CHLORIDE 0.9 % IV SOLN
INTRAVENOUS | Status: AC
Start: 2013-08-17 — End: 2013-08-17
  Administered 2013-08-17: 10:00:00 via INTRAVENOUS

## 2013-08-17 MED ORDER — LORATADINE 10 MG PO TABS
10.0000 mg | ORAL_TABLET | ORAL | Status: AC
  Administered 2013-08-17: 10 mg via ORAL
  Filled 2013-08-17: qty 1

## 2013-08-17 MED FILL — Natalizumab for IV Inj Conc 300 MG/15ML: INTRAVENOUS | Qty: 15 | Status: AC

## 2013-08-17 NOTE — Progress Notes (Signed)
Patient stayed 75minutes of one hour recommended post Tysabri infusion time. He is aware to call MD with any problems or questions. Discharged home with father.

## 2013-08-18 ENCOUNTER — Telehealth: Payer: Self-pay | Admitting: Neurology

## 2013-08-18 NOTE — Telephone Encounter (Signed)
Patient called to state that he had a Tysabri infusion and this morning woke up with weak legs. Please cal and advise patient.

## 2013-08-19 HISTORY — PX: VASECTOMY: SHX75

## 2013-08-19 NOTE — Telephone Encounter (Signed)
I have called him, he had Tysabri infusion in April 29th, noticed both leg weakness next day, 80% weaker, now has mild improvement.   I advise him to call back he gets worse next week

## 2013-09-13 ENCOUNTER — Telehealth: Payer: Self-pay | Admitting: Neurology

## 2013-09-13 NOTE — Telephone Encounter (Signed)
Please put orders in Epic for Tysabri, patient's appointment tomorrow 09-14-13.

## 2013-09-14 ENCOUNTER — Other Ambulatory Visit (HOSPITAL_COMMUNITY): Payer: Self-pay | Admitting: Neurology

## 2013-09-14 ENCOUNTER — Encounter (HOSPITAL_COMMUNITY)
Admission: RE | Admit: 2013-09-14 | Discharge: 2013-09-14 | Disposition: A | Discharge status: Home / Self Care | Payer: No Typology Code available for payment source | Source: Ambulatory Visit | Attending: Neurology | Admitting: Neurology

## 2013-09-14 ENCOUNTER — Encounter (HOSPITAL_COMMUNITY): Payer: Self-pay

## 2013-09-14 ENCOUNTER — Other Ambulatory Visit: Payer: Self-pay | Admitting: Neurology

## 2013-09-14 VITALS — BP 118/63 | HR 64 | Temp 97.2°F | Resp 18 | Ht 71.0 in | Wt 183.0 lb

## 2013-09-14 DIAGNOSIS — G35 Multiple sclerosis: Secondary | ICD-10-CM

## 2013-09-14 MED ORDER — ACETAMINOPHEN 500 MG PO TABS
1000.0000 mg | ORAL_TABLET | ORAL | Status: DC
  Administered 2013-09-14: 1000 mg via ORAL
  Filled 2013-09-14: qty 2

## 2013-09-14 MED ORDER — SODIUM CHLORIDE 0.9 % IV SOLN
INTRAVENOUS | Status: DC
  Administered 2013-09-14: 250 mL via INTRAVENOUS

## 2013-09-14 MED ORDER — LORATADINE 10 MG PO TABS
10.0000 mg | ORAL_TABLET | ORAL | Status: DC
  Administered 2013-09-14: 10 mg via ORAL
  Filled 2013-09-14: qty 1

## 2013-09-14 MED ORDER — SODIUM CHLORIDE 0.9 % IV SOLN
300.0000 mg | INTRAVENOUS | Status: DC
  Administered 2013-09-14: 300 mg via INTRAVENOUS
  Filled 2013-09-14: qty 15

## 2013-09-14 NOTE — Telephone Encounter (Signed)
Tysabri order placed.

## 2013-10-12 ENCOUNTER — Encounter (HOSPITAL_COMMUNITY)
Admission: RE | Admit: 2013-10-12 | Discharge: 2013-10-12 | Disposition: A | Discharge status: Home / Self Care | Payer: No Typology Code available for payment source | Source: Ambulatory Visit | Attending: Neurology | Admitting: Neurology

## 2013-10-12 ENCOUNTER — Encounter (HOSPITAL_COMMUNITY): Payer: Self-pay

## 2013-10-12 VITALS — BP 122/66 | HR 60 | Temp 97.2°F | Resp 20 | Ht 71.0 in | Wt 183.0 lb

## 2013-10-12 DIAGNOSIS — G35 Multiple sclerosis: Secondary | ICD-10-CM | POA: Insufficient documentation

## 2013-10-12 MED ORDER — SODIUM CHLORIDE 0.9 % IV SOLN
300.0000 mg | INTRAVENOUS | Status: DC
  Administered 2013-10-12: 300 mg via INTRAVENOUS
  Filled 2013-10-12: qty 15

## 2013-10-12 MED ORDER — ACETAMINOPHEN 500 MG PO TABS
1000.0000 mg | ORAL_TABLET | ORAL | Status: DC
  Administered 2013-10-12: 1000 mg via ORAL
  Filled 2013-10-12: qty 2

## 2013-10-12 MED ORDER — SODIUM CHLORIDE 0.9 % IV SOLN
INTRAVENOUS | Status: DC
Start: 2013-10-12 — End: 2013-10-13
  Administered 2013-10-12: 10:00:00 via INTRAVENOUS

## 2013-10-12 MED ORDER — LORATADINE 10 MG PO TABS
10.0000 mg | ORAL_TABLET | ORAL | Status: DC
  Administered 2013-10-12: 10 mg via ORAL
  Filled 2013-10-12: qty 1

## 2013-10-12 NOTE — Discharge Instructions (Signed)

## 2013-10-20 ENCOUNTER — Telehealth: Payer: Self-pay | Admitting: Neurology

## 2013-10-20 NOTE — Telephone Encounter (Signed)
Spoke to wife patient is scheduled with Dr.Yan at the end of July with Dr.Yan  .

## 2013-10-20 NOTE — Telephone Encounter (Signed)
Lawrence Lamb, give him a follow up appt within 4 weeks.

## 2013-11-09 ENCOUNTER — Encounter (HOSPITAL_COMMUNITY): Payer: Self-pay

## 2013-11-09 ENCOUNTER — Encounter (HOSPITAL_COMMUNITY)
Admission: RE | Admit: 2013-11-09 | Discharge: 2013-11-09 | Disposition: A | Discharge status: Home / Self Care | Payer: PRIVATE HEALTH INSURANCE | Source: Ambulatory Visit | Attending: Neurology | Admitting: Neurology

## 2013-11-09 VITALS — BP 109/73 | HR 57 | Temp 98.6°F | Resp 18

## 2013-11-09 DIAGNOSIS — G35 Multiple sclerosis: Secondary | ICD-10-CM | POA: Diagnosis not present

## 2013-11-09 MED ORDER — SODIUM CHLORIDE 0.9 % IV SOLN
INTRAVENOUS | Status: AC
  Administered 2013-11-09: 09:00:00 via INTRAVENOUS

## 2013-11-09 MED ORDER — ACETAMINOPHEN 500 MG PO TABS
1000.0000 mg | ORAL_TABLET | ORAL | Status: AC
  Administered 2013-11-09: 1000 mg via ORAL
  Filled 2013-11-09: qty 2

## 2013-11-09 MED ORDER — SODIUM CHLORIDE 0.9 % IV SOLN
300.0000 mg | INTRAVENOUS | Status: AC
  Administered 2013-11-09: 300 mg via INTRAVENOUS
  Filled 2013-11-09: qty 15

## 2013-11-09 MED ORDER — LORATADINE 10 MG PO TABS
10.0000 mg | ORAL_TABLET | ORAL | Status: AC
  Administered 2013-11-09: 10 mg via ORAL
  Filled 2013-11-09: qty 1

## 2013-11-09 NOTE — Discharge Instructions (Signed)

## 2013-11-10 ENCOUNTER — Telehealth: Payer: Self-pay | Admitting: Neurology

## 2013-11-10 MED ORDER — PREDNISONE 10 MG PO TABS: ORAL_TABLET | ORAL | Status: DC

## 2013-11-10 NOTE — Telephone Encounter (Signed)
Patient calling to state that he noticed weakness in his legs today as well as a strong pain on the right side, requesting a sooner appointment to be seen. Please return call and advise.

## 2013-11-10 NOTE — Telephone Encounter (Signed)
I called patient. The patient has reported onset of back pain, and weakness in both legs. The patient is having some difficulty walking. Denies any severe changes in bowel or bladder control. He is on Tysabri, and he has known antibodies for the JC virus with high titer an obese. The patient will be seen in office next week, he may require a repeat MRI the brain. Last study was done in March 2015. I'll get him on a prednisone Dosepak.

## 2013-11-10 NOTE — Telephone Encounter (Signed)
I called pt and he is having bilateral leg weakness started today , this early am, and then noted with R sided back pain. (flank?) the day before.  He does not have fever, or any UTI sx.  He is on tysabri and it has been along time since exacerbation.  Dr. Krista Blue out.  Sent to [facility].

## 2013-11-11 ENCOUNTER — Telehealth: Payer: Self-pay | Admitting: Neurology

## 2013-11-11 NOTE — Telephone Encounter (Signed)
Patient returning someone's call from this am.

## 2013-11-14 NOTE — Telephone Encounter (Signed)
Called pt to remind him about his appt on 11/16/13 with Dr. Krista Blue and to let him know that Dr. Jannifer Franklin had sent pt Rx for prednisone to his pharmacy and if he has any other problems, questions or concerns to call the office. Pt verbalized understanding.

## 2013-11-16 ENCOUNTER — Encounter: Payer: Self-pay | Admitting: Neurology

## 2013-11-16 ENCOUNTER — Ambulatory Visit (INDEPENDENT_AMBULATORY_CARE_PROVIDER_SITE_OTHER): Payer: PRIVATE HEALTH INSURANCE | Admitting: Neurology

## 2013-11-16 VITALS — BP 116/69 | HR 72 | Ht 71.0 in | Wt 190.0 lb

## 2013-11-16 DIAGNOSIS — G35 Multiple sclerosis: Secondary | ICD-10-CM

## 2013-11-16 DIAGNOSIS — R269 Unspecified abnormalities of gait and mobility: Secondary | ICD-10-CM

## 2013-11-16 MED ORDER — SERTRALINE HCL 50 MG PO TABS: 50.0000 mg | ORAL_TABLET | Freq: Every day | ORAL | Status: DC

## 2013-11-16 NOTE — Progress Notes (Addendum)
History of Present Illness:  [name] is a 43 years old right-handed African American male, accompanied by his wife, referred by his primary care physician  for evaluation of relapsing and meeting multiple sclerosis    He moved from [place] to [place] since 2013, was previously under the care of [place] [facility] specialist Dr. [name],  He was diagnosed with relapsing remitting multiple sclerosis in early 2007, presenting with right face, right leg weakness, has mild gait difficulty, diagnosis was confirmed by marked abnormal MRI of brain, there was also reported cervical lesions   He was initially treated with Betaseron for one and half years, clinically he was stable, but there was enhancing MRI lesions, he developed positive interferon antibodies.  He was started on Tysabri since February 2009, initially every 4 weeks, later stretching to every 6 weeks, JC virus antibody was negative in September 2009 and April 2010, but began to be positive since September 2010, he tolerates the [name] infusion very well, Last infusion was in Jan 2013. He has repeat MRI of brain every 6 months. [name] was used from Feb 2009 till Jan 2013).  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 Medrol  Pack again  in Feb 2014.  Abnormal visual evoked response test secondary to prolongation of the P100 waveform on the left.    He now complains of constant gait difficulty, bilateral lower extremity spasticity, also complains of urinary incontinence, double vision, he could no longer driving,  also complains of worsening memory trouble, difficulty focusing, could not take care of his young children by himself.     I have discussed with patient and his wife again, repeat JC virus antibody was positive with index 4.15 in June 2014, He has been on [name] since Jan 2014, he has continued clinical worsening, worsening gait difficulty  I have reviewed MRI, I was not able to compare with previous scan, but there was extensive periventricular oval-shaped lesions, I have discussed with him and his wife treatment options, we decided to proceed with [name] infusion, stop [name], he also got second opinion from his previous neurologist Dr. Kennith Center in [place], who concurred with plan.   He was treated with [name] since January 2014 for 6 months, continue mild worsening of gait difficulty, urinary incontinence, repeat MRI showed extensive lesions, He is 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 in June 2014, 2.66 in 11/22/2013  I have discussed treatment options with patient and his wife, we decided to proceed with [name] infusion, his JC-virus titer was 4.15. There is 10/998 chance of PML,  He restarted on [name] infusion  since Oct 14th 2014, he complains of intermittent double vision, persistent. He continues to have gait difficulty.  UPDATE July 29th 2015:  He is walking with cane, is more forgetful, confusion,  Sits most of time, watching for kids. He is depressed. He called complaining of gait difficulty in July 23rd, 2015, was given steroid package, which helped him some.   He complains of worsening depression, while  taking Cymbalta  Review of Systems  Out of a complete 14 system review, the patient complains of only the following symptoms, and all other reviewed systems are negative.  Fatigue, blurred vision, double vision,  PHYSICAL EXAMINATOINS:  Generalized: In no acute distress  Neck: Supple, no carotid bruits   Cardiac: Regular rate rhythm  Pulmonary: Clear to auscultation bilaterally  Musculoskeletal: No deformity  Neurological examination  Mentation: Alert oriented to time, place, history taking, and causual conversation  Cranial nerve II-XII: Pupils were equal round reactive to light extraocular movements were full, visual field were full on confrontational test. facial sensation and strength were normal. hearing was intact to finger rubbing bilaterally. Uvula tongue midline.  head turning and shoulder shrug and were normal and symmetric.Tongue protrusion into cheek strength was normal.  Motor: Bilateral lower extremity mild to moderate spasticity, mild bilateral hip flexor weakness  Sensory: Intact to fine touch, pinprick, decreased vibratory sensation, and proprioception at toes.  Coordination: Normal finger to nose, heel-to-shin bilaterally there was no truncal ataxia  Gait: Getting up from chair by pushing up on the chair on, stiff  wide based gait, cautious dragging right leg  Romberg signs: Negative  Deep tendon reflexes: Hyperreflexia,   Assessment and plan: 43 years old [place] American male, with history of relapsing and meeting multiple sclerosis, JC virus positive, previously Tysabri treatment ( Feb 2009 till Jan 2013),  He was treated with [name] since ( January 2014 to Oct 2014), during treatment with [name], he continues to have mild worsening of gait difficulty, urinary incontinence, repeat MRI showed extensive lesions, He is 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 in July  2014  I have discussed treatment options with patient and his wife, we decided to  proceed with [name] infusion, his JC-virus titer was 4.15. There is 10/998 chance of PML,    He restarted [name] infusion since Oct 2014, repeat MRI of brain in March 2015, showing multiple periventricular, subcortical, brain stem and cerebellar as well as corpus callosum white matter hyperintensities quite typical for multiple sclerosis. No enhancing lesions are noted. The presence of several T1 black holes and significant atrophy of corpus callosum and mild generalized cerebral atrophy indicates chronic disease.   He is to continue Tysabri infusion, he has been taking baclofen 10 mg 3 times a day,  Repeat JC virus titer every 3 months, Repeat MRi brain every 6 months Refer to [facility]  Addendum: I have reviewed her MRI report dated August the fifth 2015 from [facility], which has demonstrated extensive supratentorium, infratentorial white matter lesions, as well as cervical, upper thoracic spine cord lesions, consistent with MS, advanced cerebral atrophy, lower cervical spinal cord myelomalacia, I do not have access of the film,

## 2013-11-18 ENCOUNTER — Telehealth: Payer: Self-pay

## 2013-11-18 NOTE — Telephone Encounter (Signed)
Called patient to inform referral sent to [facility].Gave appt info. Patient verbalized understanding.

## 2013-11-22 ENCOUNTER — Other Ambulatory Visit (INDEPENDENT_AMBULATORY_CARE_PROVIDER_SITE_OTHER): Payer: Self-pay

## 2013-11-22 DIAGNOSIS — Z0289 Encounter for other administrative examinations: Secondary | ICD-10-CM

## 2013-11-23 ENCOUNTER — Telehealth: Payer: Self-pay | Admitting: Neurology

## 2013-11-23 LAB — COMPREHENSIVE METABOLIC PANEL
ALT: 22 IU/L (ref 0–44)
AST: 14 IU/L (ref 0–40)
Albumin/Globulin Ratio: 2.3 (ref 1.1–2.5)
Albumin: 4.8 g/dL (ref 3.5–5.5)
Alkaline Phosphatase: 37 IU/L — ABNORMAL LOW (ref 39–117)
BUN/Creatinine Ratio: 11 (ref 9–20)
BUN: 10 mg/dL (ref 6–24)
CO2: 27 mmol/L (ref 18–29)
Calcium: 9.9 mg/dL (ref 8.7–10.2)
Chloride: 100 mmol/L (ref 97–108)
Creatinine, Ser: 0.95 mg/dL (ref 0.76–1.27)
GFR calc Af Amer: 113 mL/min/{1.73_m2} (ref >59)
GFR calc non Af Amer: 98 mL/min/{1.73_m2} (ref >59)
Globulin, Total: 2.1 g/dL (ref 1.5–4.5)
Glucose: 77 mg/dL (ref 65–99)
Potassium: 4.4 mmol/L (ref 3.5–5.2)
Sodium: 140 mmol/L (ref 134–144)
Total Bilirubin: 0.6 mg/dL (ref 0.0–1.2)
Total Protein: 6.9 g/dL (ref 6.0–8.5)

## 2013-11-23 LAB — CBC WITH DIFFERENTIAL
Basophils Absolute: 0 10*3/uL (ref 0.0–0.2)
Basos: 0 %
Eos: 1 %
Eosinophils Absolute: 0.1 10*3/uL (ref 0.0–0.4)
HCT: 40.2 % (ref 37.5–51.0)
Hemoglobin: 13.3 g/dL (ref 12.6–17.7)
Immature Grans (Abs): 0 10*3/uL (ref 0.0–0.1)
Immature Granulocytes: 0 %
Lymphocytes Absolute: 4 10*3/uL — ABNORMAL HIGH (ref 0.7–3.1)
Lymphs: 49 %
MCH: 30.2 pg (ref 26.6–33.0)
MCHC: 33.1 g/dL (ref 31.5–35.7)
MCV: 91 fL (ref 79–97)
Monocytes Absolute: 0.4 10*3/uL (ref 0.1–0.9)
Monocytes: 5 %
Neutrophils Absolute: 3.8 10*3/uL (ref 1.4–7.0)
Neutrophils Relative %: 45 %
Platelets: 319 10*3/uL (ref 150–379)
RBC: 4.4 x10E6/uL (ref 4.14–5.80)
RDW: 13.6 % (ref 12.3–15.4)
WBC: 8.5 10*3/uL (ref 3.4–10.8)

## 2013-11-23 LAB — THYROID PANEL WITH TSH
Free Thyroxine Index: 2.4 (ref 1.2–4.9)
T3 Uptake Ratio: 27 % (ref 24–39)
T4, Total: 9 ug/dL (ref 4.5–12.0)
TSH: 0.569 u[IU]/mL (ref 0.450–4.500)

## 2013-11-23 LAB — LYME, TOTAL AB TEST/REFLEX: Lyme IgG/IgM Ab: <0.91 {ISR} (ref 0.00–0.90)

## 2013-11-23 LAB — HIV ANTIBODY (ROUTINE TESTING W REFLEX)
HIV 1/O/2 Abs-Index Value: <1 (ref <1.00)
HIV-1/HIV-2 Ab: NONREACTIVE

## 2013-11-23 LAB — ANA W/REFLEX IF POSITIVE: Anti Nuclear Antibody(ANA): NEGATIVE

## 2013-11-23 LAB — CK: Total CK: 132 U/L (ref 24–204)

## 2013-11-23 LAB — FOLATE: Folate: >19.9 ng/mL (ref >3.0)

## 2013-11-23 LAB — SEDIMENTATION RATE: Sed Rate: 2 mm/hr (ref 0–15)

## 2013-11-23 LAB — C-REACTIVE PROTEIN: CRP: 0.5 mg/L (ref 0.0–4.9)

## 2013-11-23 LAB — RPR: RPR: NONREACTIVE

## 2013-11-23 LAB — VITAMIN B12: Vitamin B-12: 422 pg/mL (ref 211–946)

## 2013-11-23 NOTE — Telephone Encounter (Signed)
Please call patient for normal lab, also check on his MRI schedules.

## 2013-11-23 NOTE — Telephone Encounter (Signed)
Called pt and left message informing him per Dr. Krista Blue that his lab work results were normal and if he has any other problems, questions or concerns to call the office. Pt does have his MRI set up for today. FYI

## 2013-11-28 ENCOUNTER — Telehealth: Payer: Self-pay | Admitting: Neurology

## 2013-11-28 NOTE — Telephone Encounter (Signed)
Dana: Please make sure he is referred to [facility] neurology, [place] specialist, also let him come back with me in my next available he should bring his MRI with him on follow up visit to review and compare.

## 2013-12-01 NOTE — Telephone Encounter (Signed)
Patient has already been scheduled at [facility].  Waiting for MRI to be scheduled and I will schedule follow up.

## 2013-12-07 ENCOUNTER — Encounter: Payer: Self-pay | Admitting: Neurology

## 2013-12-07 ENCOUNTER — Encounter (HOSPITAL_COMMUNITY): Payer: Self-pay

## 2013-12-07 ENCOUNTER — Encounter (HOSPITAL_COMMUNITY)
Admission: RE | Admit: 2013-12-07 | Discharge: 2013-12-07 | Disposition: A | Discharge status: Home / Self Care | Payer: PRIVATE HEALTH INSURANCE | Source: Ambulatory Visit | Attending: Neurology | Admitting: Neurology

## 2013-12-07 VITALS — BP 110/58 | HR 66 | Temp 98.6°F | Resp 19 | Ht 71.0 in | Wt 190.0 lb

## 2013-12-07 DIAGNOSIS — G35 Multiple sclerosis: Secondary | ICD-10-CM | POA: Diagnosis present

## 2013-12-07 MED ORDER — LORATADINE 10 MG PO TABS
10.0000 mg | ORAL_TABLET | ORAL | Status: AC
  Administered 2013-12-07: 10 mg via ORAL
  Filled 2013-12-07: qty 1

## 2013-12-07 MED ORDER — ACETAMINOPHEN 500 MG PO TABS
1000.0000 mg | ORAL_TABLET | ORAL | Status: AC
  Administered 2013-12-07: 1000 mg via ORAL
  Filled 2013-12-07: qty 2

## 2013-12-07 MED ORDER — SODIUM CHLORIDE 0.9 % IV SOLN
INTRAVENOUS | Status: AC
  Administered 2013-12-07: 09:00:00 via INTRAVENOUS

## 2013-12-07 MED ORDER — SODIUM CHLORIDE 0.9 % IV SOLN
300.0000 mg | INTRAVENOUS | Status: AC
  Administered 2013-12-07: 300 mg via INTRAVENOUS
  Filled 2013-12-07: qty 15

## 2013-12-09 NOTE — Telephone Encounter (Signed)
Patient has been notified and scheduled for appointment 12-21-13 at 0830, per Dr.Yan.

## 2013-12-09 NOTE — Telephone Encounter (Signed)
Patient had to reschedule for 12-20-13 at 2pm.

## 2013-12-09 NOTE — Telephone Encounter (Signed)
Yes, please bring MRI CDs for [place] to review on my next availalbe appt.

## 2013-12-09 NOTE — Telephone Encounter (Signed)
Spoke to patient and he had his MRI brain and cervical at [facility] and will be glad to pick up CDs.  He is also scheduled at [facility] on 01-10-14, and [facility] on 03-14-14.   Dr. Krista Blue do you want to see him before [facility] appointment?  Please advise and I will schedule.

## 2013-12-20 ENCOUNTER — Ambulatory Visit (INDEPENDENT_AMBULATORY_CARE_PROVIDER_SITE_OTHER): Payer: PRIVATE HEALTH INSURANCE | Admitting: Neurology

## 2013-12-20 ENCOUNTER — Ambulatory Visit: Payer: Self-pay | Admitting: Neurology

## 2013-12-20 ENCOUNTER — Encounter: Payer: Self-pay | Admitting: Neurology

## 2013-12-20 VITALS — BP 123/77 | HR 74 | Ht 71.0 in | Wt 192.0 lb

## 2013-12-20 DIAGNOSIS — R269 Unspecified abnormalities of gait and mobility: Secondary | ICD-10-CM

## 2013-12-20 DIAGNOSIS — G35 Multiple sclerosis: Secondary | ICD-10-CM

## 2013-12-20 MED ORDER — BACLOFEN 10 MG PO TABS: 10.0000 mg | ORAL_TABLET | Freq: Three times a day (TID) | ORAL | Status: DC

## 2013-12-20 NOTE — Progress Notes (Signed)
History of Present Illness:  Lawrence Lamb is a 43 years old right-handed African American male, accompanied by his wife, referred by his primary care physician Dr. Pleas Lamb for evaluation of relapsing and meeting multiple sclerosis    He moved from [place] to [place] since 2013, was previously under the care of [name] [facility] specialist Dr. Stephani Lamb,  He was diagnosed with relapsing remitting multiple sclerosis in early 2007, presenting with right face, right leg weakness, has mild gait difficulty, diagnosis was confirmed by marked abnormal MRI of brain, and cervical lesions   He was initially treated with Betaseron for 18 months, clinically he was stable, but there was enhancing MRI lesions on Betaseron, he developed positive interferon antibodies.  He was started on Tysabri since February 2009, initially every 4 weeks, later stretching to every 6 weeks, JC virus antibody was negative in September 2009 and April 2010, but JC virus antibody began to be positive since September 2010, he tolerates the Lawrence Lamb infusion very well, Last infusion was in Jan 2013. He has repeat MRI of brain every 6 months. Lawrence Lamb was used from Feb 2009 till Jan 2013).  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  He was treated with [name] from January 24th, 2014 for 6 months, but he continued to have slow progressive worsening gait difficulty, fell multiple times, had few rounds of steroid.  Abnormal visual evoked response test secondary to prolongation of the P100 waveform on the left.    He now complains of constant gait difficulty, bilateral lower extremity spasticity, also complains of urinary incontinence, double vision, he could no longer driving,  also complains of worsening memory trouble, difficulty focusing, could not take care of his young  children by himself.     Lawrence Lamb positive, index value is 4.15 in June 2014, 2.66 in 11/22/2013   I have discussed treatment options with patient and his wife, he had progressive worsening gait difficulty, memory trouble, vision problem since he was off Lawrence Lamb, despite [name] treatment,  His symptoms were relatively stable while taking Lawrence Lamb is 10/998 chance of PML,  He restarted on [name] infusion  since Oct 14th 2014   UPDATE Sept 2015 During followup visit, he continued to complain progressive worsening gait difficulty, spasticity of lower extremity muscles, clumsiness of bilateral hands, fatigue, memory trouble, difficulty focusing, double vision, he is no longer driving, walking with assistance, difficulty keeping his young children at home.   We have reviewed MRI of the brain with without contrast August 2015, extensive supratentorium, and posterior fossa lesions, no significant progression compared to previous scans in March 2015,  MRI cervical with without contrast also demonstrated prolonged segments of lesions involving midcervical cord, no contrast enhancement  Review of Systems  Out of a complete 14 system review, the patient complains of only the following symptoms, and all other reviewed systems are negative.  Double vision, fatigue, memory trouble, depression, progressive gait difficulty  PHYSICAL EXAMINATOINS:  Generalized: In no acute distress  Neck: Supple, no carotid bruits   Cardiac: Regular rate rhythm  Pulmonary: Clear to auscultation bilaterally  Musculoskeletal: No deformity  Neurological examination  Mentation: Alert oriented to time, place, history taking, and causual conversation  Cranial nerve II-XII: Pupils were equal round reactive to light extraocular movements were full, visual field were full on confrontational test. facial sensation and strength were normal. hearing  was intact to finger rubbing bilaterally. Uvula tongue midline.   head turning and shoulder shrug and were normal and symmetric.Tongue protrusion into cheek strength was normal.  Motor: Bilateral lower extremity mild to moderate spasticity, mild bilateral hip flexor weakness  Sensory: Intact to fine touch, pinprick, decreased vibratory sensation, and proprioception at toes.  Coordination: Normal finger to nose, heel-to-shin bilaterally there was no truncal ataxia  Gait: Getting up from chair by pushing up on the chair on, stiff  wide based gait, cautious dragging right leg  Romberg signs: Negative  Deep tendon reflexes: Hyperreflexia,   Assessment and plan: 43 years old [place] American male, with history of relapsing and meeting multiple sclerosis, JC virus positive, previously Tysabri treatment ( Feb 2009 till Jan 2013),  He was treated with [name] since ( January 2014 to Oct 2014), during treatment with [name], he continues to have  Progressive worsening gait difficulty, urinary incontinence.  He is Lawrence Lamb positive, index value is 4.15 in July  2014, 2.66 in August 2015  I have discussed treatment options with patient and his wife, we decided to proceed with [name] infusion, despite positive JC virus antibody with high titer. There is 10/998 chance of PML,  He restarted [name] infusion since Oct 2014,   He is to continue Tysabri infusion,  Repeat JC virus titer every 3 months, Repeat MRi brain every 6 months Refer to [facility]

## 2014-01-04 ENCOUNTER — Other Ambulatory Visit: Payer: Self-pay | Admitting: Neurology

## 2014-01-04 ENCOUNTER — Encounter (HOSPITAL_COMMUNITY): Payer: Self-pay

## 2014-01-04 ENCOUNTER — Encounter (HOSPITAL_COMMUNITY)
Admission: RE | Admit: 2014-01-04 | Discharge: 2014-01-04 | Disposition: A | Discharge status: Home / Self Care | Payer: PRIVATE HEALTH INSURANCE | Source: Ambulatory Visit | Attending: Neurology | Admitting: Neurology

## 2014-01-04 VITALS — BP 112/68 | HR 61 | Temp 98.4°F | Resp 20 | Ht 71.0 in | Wt 185.0 lb

## 2014-01-04 DIAGNOSIS — G35 Multiple sclerosis: Secondary | ICD-10-CM | POA: Diagnosis present

## 2014-01-04 MED ORDER — LORATADINE 10 MG PO TABS
10.0000 mg | ORAL_TABLET | ORAL | Status: DC
  Administered 2014-01-04: 10 mg via ORAL
  Filled 2014-01-04: qty 1

## 2014-01-04 MED ORDER — ACETAMINOPHEN 500 MG PO TABS
1000.0000 mg | ORAL_TABLET | ORAL | Status: DC
  Administered 2014-01-04: 1000 mg via ORAL
  Filled 2014-01-04: qty 2

## 2014-01-04 MED ORDER — SODIUM CHLORIDE 0.9 % IV SOLN
300.0000 mg | INTRAVENOUS | Status: DC
  Administered 2014-01-04: 300 mg via INTRAVENOUS
  Filled 2014-01-04: qty 15

## 2014-01-04 MED ORDER — SODIUM CHLORIDE 0.9 % IV SOLN
INTRAVENOUS | Status: DC
  Administered 2014-01-04: 09:00:00 via INTRAVENOUS

## 2014-01-04 NOTE — Discharge Instructions (Signed)

## 2014-01-09 ENCOUNTER — Telehealth: Payer: Self-pay | Admitting: Neurology

## 2014-01-09 NOTE — Telephone Encounter (Signed)
Patient requesting name of medication Dr. Krista Blue wants him to change from Tysabri, he's going to [facility] to have 2nd opinion.  Has appointment ton 9/22, please call asap and may leave detailed message.

## 2014-01-09 NOTE — Telephone Encounter (Signed)
Called and spoke to patient and he stated he had been on the Internet and he does not no if he want to go to [facility] now. Patient has appt with Jeanmarie Plant 01-10-2014 at 2:00 pm. Patient states I would like to speak to Dr.Yan first. Does Dr.Yan no what RX this Doctor is going to put me on. Dr.Yan out of the office until 01-10-2014. Patient was fine to wait for Dr.Yan to return in the morning 01-11-2014.

## 2014-01-10 NOTE — Telephone Encounter (Signed)
I have called him, encourage him to follow up with [facility] specialist for 2nd opinion

## 2014-01-16 ENCOUNTER — Ambulatory Visit: Payer: PRIVATE HEALTH INSURANCE | Admitting: Neurology

## 2014-01-27 ENCOUNTER — Ambulatory Visit: Payer: PRIVATE HEALTH INSURANCE | Admitting: Neurology

## 2014-02-01 ENCOUNTER — Encounter (HOSPITAL_COMMUNITY)
Admission: RE | Admit: 2014-02-01 | Discharge: 2014-02-01 | Disposition: A | Discharge status: Home / Self Care | Payer: PRIVATE HEALTH INSURANCE | Source: Ambulatory Visit | Attending: Neurology | Admitting: Neurology

## 2014-02-01 ENCOUNTER — Encounter (HOSPITAL_COMMUNITY): Payer: Self-pay

## 2014-02-01 VITALS — BP 106/63 | HR 57 | Temp 98.0°F | Resp 16

## 2014-02-01 DIAGNOSIS — G35 Multiple sclerosis: Secondary | ICD-10-CM

## 2014-02-01 MED ORDER — LORATADINE 10 MG PO TABS
10.0000 mg | ORAL_TABLET | ORAL | Status: DC
Start: 2014-02-01 — End: 2014-02-02
  Administered 2014-02-01: 10 mg via ORAL
  Filled 2014-02-01: qty 1

## 2014-02-01 MED ORDER — SODIUM CHLORIDE 0.9 % IV SOLN
INTRAVENOUS | Status: DC
  Administered 2014-02-01: 10:00:00 via INTRAVENOUS

## 2014-02-01 MED ORDER — ACETAMINOPHEN 500 MG PO TABS
1000.0000 mg | ORAL_TABLET | ORAL | Status: DC
  Administered 2014-02-01: 1000 mg via ORAL
  Filled 2014-02-01: qty 2

## 2014-02-01 MED ORDER — SODIUM CHLORIDE 0.9 % IV SOLN
300.0000 mg | INTRAVENOUS | Status: DC
  Administered 2014-02-01: 300 mg via INTRAVENOUS
  Filled 2014-02-01: qty 15

## 2014-03-02 ENCOUNTER — Encounter (HOSPITAL_COMMUNITY)
Admission: RE | Admit: 2014-03-02 | Discharge: 2014-03-02 | Disposition: A | Discharge status: Home / Self Care | Payer: PRIVATE HEALTH INSURANCE | Source: Ambulatory Visit | Attending: Neurology | Admitting: Neurology

## 2014-03-02 ENCOUNTER — Encounter (HOSPITAL_COMMUNITY): Payer: Self-pay

## 2014-03-02 VITALS — BP 130/74 | HR 56 | Temp 98.3°F | Resp 20 | Ht 71.0 in | Wt 185.0 lb

## 2014-03-02 DIAGNOSIS — G35 Multiple sclerosis: Secondary | ICD-10-CM | POA: Diagnosis present

## 2014-03-02 MED ORDER — ACETAMINOPHEN 500 MG PO TABS
1000.0000 mg | ORAL_TABLET | ORAL | Status: AC
  Administered 2014-03-02: 1000 mg via ORAL
  Filled 2014-03-02: qty 2

## 2014-03-02 MED ORDER — SODIUM CHLORIDE 0.9 % IV SOLN
300.0000 mg | INTRAVENOUS | Status: AC
  Administered 2014-03-02: 300 mg via INTRAVENOUS
  Filled 2014-03-02: qty 15

## 2014-03-02 MED ORDER — SODIUM CHLORIDE 0.9 % IV SOLN
INTRAVENOUS | Status: AC
  Administered 2014-03-02: 10:00:00 via INTRAVENOUS

## 2014-03-02 MED ORDER — LORATADINE 10 MG PO TABS
10.0000 mg | ORAL_TABLET | ORAL | Status: AC
  Administered 2014-03-02: 10 mg via ORAL
  Filled 2014-03-02: qty 1

## 2014-03-02 NOTE — Discharge Instructions (Signed)

## 2014-03-14 ENCOUNTER — Ambulatory Visit: Payer: PRIVATE HEALTH INSURANCE | Admitting: Neurology

## 2014-03-30 ENCOUNTER — Encounter (HOSPITAL_COMMUNITY): Payer: Self-pay

## 2014-03-30 ENCOUNTER — Ambulatory Visit (INDEPENDENT_AMBULATORY_CARE_PROVIDER_SITE_OTHER): Payer: PRIVATE HEALTH INSURANCE | Admitting: Neurology

## 2014-03-30 ENCOUNTER — Ambulatory Visit: Payer: PRIVATE HEALTH INSURANCE | Admitting: Neurology

## 2014-03-30 ENCOUNTER — Encounter (HOSPITAL_COMMUNITY)
Admission: RE | Admit: 2014-03-30 | Discharge: 2014-03-30 | Disposition: A | Discharge status: Home / Self Care | Payer: PRIVATE HEALTH INSURANCE | Source: Ambulatory Visit | Attending: Neurology | Admitting: Neurology

## 2014-03-30 ENCOUNTER — Encounter: Payer: Self-pay | Admitting: Neurology

## 2014-03-30 VITALS — BP 116/72 | HR 69 | Temp 98.3°F | Resp 16 | Ht 71.0 in | Wt 188.5 lb

## 2014-03-30 VITALS — BP 131/69 | HR 55 | Temp 97.5°F | Resp 20 | Ht 71.0 in | Wt 185.0 lb

## 2014-03-30 DIAGNOSIS — G35 Multiple sclerosis: Secondary | ICD-10-CM

## 2014-03-30 DIAGNOSIS — R269 Unspecified abnormalities of gait and mobility: Secondary | ICD-10-CM

## 2014-03-30 MED ORDER — SODIUM CHLORIDE 0.9 % IV SOLN
300.0000 mg | INTRAVENOUS | Status: AC
  Administered 2014-03-30: 300 mg via INTRAVENOUS
  Filled 2014-03-30: qty 15

## 2014-03-30 MED ORDER — ACETAMINOPHEN 500 MG PO TABS
1000.0000 mg | ORAL_TABLET | ORAL | Status: AC
  Administered 2014-03-30: 1000 mg via ORAL
  Filled 2014-03-30: qty 2

## 2014-03-30 MED ORDER — SODIUM CHLORIDE 0.9 % IV SOLN
INTRAVENOUS | Status: AC
  Administered 2014-03-30: 10:00:00 via INTRAVENOUS

## 2014-03-30 MED ORDER — LORATADINE 10 MG PO TABS
10.0000 mg | ORAL_TABLET | ORAL | Status: AC
  Administered 2014-03-30: 10 mg via ORAL
  Filled 2014-03-30: qty 1

## 2014-03-30 NOTE — Discharge Instructions (Signed)

## 2014-03-30 NOTE — Progress Notes (Signed)
History of Present Illness:  Lawrence Lamb is a 44 years old right-handed African American male, accompanied by his wife, referred by his primary care physician Lawrence Lamb for evaluation of relapsing and meeting multiple sclerosis    He moved from [place] to [place] since 2013, was previously under the care of [facility] specialist Lawrence Lamb,  He was diagnosed with relapsing remitting multiple sclerosis in early 2007, presenting with right face, right leg weakness, has mild gait difficulty, diagnosis was confirmed by marked abnormal MRI of brain, and cervical lesions   He was initially treated with Betaseron for 18 months, clinically he was stable, but there was enhancing MRI lesions on Betaseron, he developed positive interferon antibodies.  He was started on Tysabri since February 2009, initially every 4 weeks, later stretching to every 6 weeks, JC virus antibody was negative in September 2009 and April 2010, but JC virus antibody began to be positive since September 2010, he tolerates the [name] infusion very well, Last infusion was in Jan 2013. He has repeat MRI of brain every 6 months. Lawrence Lamb was used from Feb 2009 till Jan 2013).  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  He was treated with [name] from January 24th, 2014 for 6 months, but he continued to have slow progressive worsening gait difficulty, fell multiple times, had few rounds of steroid.  Abnormal visual evoked response test secondary to prolongation of the P100 waveform on the left.    He now complains of constant gait difficulty, bilateral lower extremity spasticity, also complains of urinary incontinence, double vision, he could no longer driving,  also complains of worsening memory trouble, difficulty focusing, could not take care of his young  children by himself.     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 in June 2014, 2.66 in 11/22/2013   I have discussed treatment options with patient and his wife, he had progressive worsening gait difficulty, memory trouble, vision problem since he was off [name], despite [name] treatment,  His symptoms were relatively stable while taking Lawrence Lamb is 10/998 chance of PML,  He restarted on [name] infusion  since Oct 14th 2014   UPDATE Sept 2015 During followup visit, he continued to complain progressive worsening gait difficulty, spasticity of lower extremity muscles, clumsiness of bilateral hands, fatigue, memory trouble, difficulty focusing, double vision, he is no longer driving, walking with assistance, difficulty keeping his young children at home.   We have reviewed MRI of the brain with without contrast August 2015, extensive supratentorium, and posterior fossa lesions, no significant progression compared to previous scans in March 2015,  MRI cervical with without contrast also demonstrated prolonged segments of lesions involving midcervical cord, no contrast enhancement  UPDATE Dec 10th 2015:  He was evaluated by [facility] Lawrence Lamb  January 10 2014, will agree with current plan, patient likely enter into the degenerative stages of his MS, but he continued to complain gradual decline, Lawrence Lamb still reasonable choice, He is forgetful, waking up at evening, he complains of double vision.  He is taking Ampyra 10 mg bid, did not have significant side effect, which did help his gait some   Review of Systems  Out of a complete 14 system review, the patient complains of only the following symptoms, and all other reviewed systems are negative.  Double vision, fatigue, memory trouble, depression, progressive  gait difficulty  PHYSICAL EXAMINATOINS:  Generalized: In no acute distress  Neck: Supple, no carotid bruits   Cardiac: Regular rate rhythm  Pulmonary: Clear to  auscultation bilaterally  Musculoskeletal: No deformity  Neurological examination  Mentation: Alert oriented to time, place, history taking, and causual conversation  Cranial nerve II-XII: Pupils were equal round reactive to light extraocular movements were full, visual field were full on confrontational test. facial sensation and strength were normal. hearing was intact to finger rubbing bilaterally. Uvula tongue midline.  head turning and shoulder shrug and were normal and symmetric.Tongue protrusion into cheek strength was normal.  Motor: Bilateral lower extremity mild to moderate spasticity, mild bilateral hip flexor weakness  Sensory: Intact to fine touch, pinprick, decreased vibratory sensation, and proprioception at toes.  Coordination: Normal finger to nose, heel-to-shin bilaterally there was no truncal ataxia  Gait: Getting up from chair by pushing up on the chair on, stiff  wide based gait, cautious dragging right leg  Romberg signs: Negative  Deep tendon reflexes: Hyperreflexia,   Assessment and plan: 43 years old [place] American male, with history of relapsing and meeting multiple sclerosis, JC virus positive, previously Tysabri treatment ( Feb 2009 till Jan 2013),  He was treated with [name]  ( January 2014 to Oct 2014), during treatment with [name], he continues to have  progressive worsening gait difficulty, urinary incontinence.  He is 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 in July  2014, 2.66 in August 2015  I have discussed treatment options with patient and his wife, we decided to proceed with [name] infusion, despite positive JC virus antibody with high titer. There is 10/998 chance of PML,  He restarted [name] infusion since Oct 2014,   He is to continue Tysabri infusion,  Repeat JC virus titer, MRi brain every 6 months  Physical therapy Return to clinic in February 2016 following above testing

## 2014-04-10 ENCOUNTER — Telehealth: Payer: Self-pay | Admitting: Neurology

## 2014-04-10 NOTE — Telephone Encounter (Signed)
Patient stated he saw [facility] and was referred to Psychiatrist.  He informed Psychiatrist that he was waking up from a dead sleep every morning around 2 am.  Psychiatrist prescribed Tiazidine 100 mg and Eupropion 150 mg, 1/2 tab for 14 days and 1 tablet in am and 1 tablet at noon.  Please call and advise.

## 2014-04-10 NOTE — Telephone Encounter (Signed)
Patient wants to know if it's ok for him to take medication. ? Stated to patient that Dr.Yan would be back in the office 04-11-2014. Patient was fine to wait and he was ok.

## 2014-04-11 NOTE — Telephone Encounter (Signed)
Called and spoke to patient told him Per Dr.Yan ok to start RX. Patient understood.

## 2014-04-11 NOTE — Telephone Encounter (Signed)
Lawrence Lamb please call back patient:   Chart reviewed,  It is [place] for him to take bupropion and tizanidine

## 2014-04-25 ENCOUNTER — Telehealth: Payer: Self-pay | Admitting: Neurology

## 2014-04-25 ENCOUNTER — Other Ambulatory Visit: Payer: Self-pay | Admitting: Neurology

## 2014-04-25 DIAGNOSIS — G35 Multiple sclerosis: Secondary | ICD-10-CM

## 2014-04-25 NOTE — Telephone Encounter (Signed)
-----   Message from Marcial Pacas, MD sent at 03/30/2014  1:36 PM EST ----- Need to put order for MRI brain, cervical w/wo, JC virus titer,  [name] antibody in May 05 2014.

## 2014-04-27 ENCOUNTER — Encounter (HOSPITAL_COMMUNITY): Admission: RE | Admit: 2014-04-27 | Payer: PRIVATE HEALTH INSURANCE | Source: Ambulatory Visit

## 2014-05-03 ENCOUNTER — Encounter (HOSPITAL_COMMUNITY)
Admission: RE | Admit: 2014-05-03 | Discharge: 2014-05-03 | Disposition: A | Discharge status: Home / Self Care | Payer: PRIVATE HEALTH INSURANCE | Source: Ambulatory Visit | Attending: Neurology | Admitting: Neurology

## 2014-05-03 ENCOUNTER — Encounter (HOSPITAL_COMMUNITY): Payer: Self-pay

## 2014-05-03 VITALS — BP 149/76 | HR 65 | Temp 98.3°F | Resp 16

## 2014-05-03 DIAGNOSIS — G35 Multiple sclerosis: Secondary | ICD-10-CM | POA: Insufficient documentation

## 2014-05-03 MED ORDER — LORATADINE 10 MG PO TABS
10.0000 mg | ORAL_TABLET | ORAL | Status: DC
  Administered 2014-05-03: 10 mg via ORAL
  Filled 2014-05-03: qty 1

## 2014-05-03 MED ORDER — SODIUM CHLORIDE 0.9 % IV SOLN
300.0000 mg | INTRAVENOUS | Status: DC
  Administered 2014-05-03: 300 mg via INTRAVENOUS
  Filled 2014-05-03: qty 15

## 2014-05-03 MED ORDER — ACETAMINOPHEN 500 MG PO TABS
1000.0000 mg | ORAL_TABLET | ORAL | Status: DC
  Administered 2014-05-03: 1000 mg via ORAL
  Filled 2014-05-03: qty 2

## 2014-05-03 MED ORDER — SODIUM CHLORIDE 0.9 % IV SOLN
INTRAVENOUS | Status: DC
  Administered 2014-05-03: 10:00:00 via INTRAVENOUS

## 2014-05-25 ENCOUNTER — Encounter (HOSPITAL_COMMUNITY): Payer: PRIVATE HEALTH INSURANCE

## 2014-05-31 ENCOUNTER — Encounter (HOSPITAL_COMMUNITY)
Admission: RE | Admit: 2014-05-31 | Discharge: 2014-05-31 | Disposition: A | Discharge status: Home / Self Care | Payer: PRIVATE HEALTH INSURANCE | Source: Ambulatory Visit | Attending: Neurology | Admitting: Neurology

## 2014-05-31 VITALS — BP 120/83 | HR 67 | Temp 98.4°F | Resp 16 | Ht 71.0 in | Wt 185.0 lb

## 2014-05-31 DIAGNOSIS — G35 Multiple sclerosis: Secondary | ICD-10-CM | POA: Insufficient documentation

## 2014-05-31 MED ORDER — LORATADINE 10 MG PO TABS
10.0000 mg | ORAL_TABLET | ORAL | Status: DC
  Administered 2014-05-31: 10 mg via ORAL
  Filled 2014-05-31: qty 1

## 2014-05-31 MED ORDER — SODIUM CHLORIDE 0.9 % IV SOLN
INTRAVENOUS | Status: DC
  Administered 2014-05-31: 250 mL via INTRAVENOUS

## 2014-05-31 MED ORDER — SODIUM CHLORIDE 0.9 % IV SOLN
300.0000 mg | INTRAVENOUS | Status: DC
  Administered 2014-05-31: 300 mg via INTRAVENOUS
  Filled 2014-05-31: qty 15

## 2014-05-31 MED ORDER — ACETAMINOPHEN 500 MG PO TABS
1000.0000 mg | ORAL_TABLET | ORAL | Status: DC
  Administered 2014-05-31: 1000 mg via ORAL
  Filled 2014-05-31: qty 2

## 2014-05-31 NOTE — Progress Notes (Signed)
Pt stayed 30 minutes of the suggested 1 hour post infusion observation time. He will contact his Dr for any questions or concerns

## 2014-06-01 ENCOUNTER — Ambulatory Visit (INDEPENDENT_AMBULATORY_CARE_PROVIDER_SITE_OTHER): Payer: PRIVATE HEALTH INSURANCE | Admitting: Neurology

## 2014-06-01 ENCOUNTER — Encounter: Payer: Self-pay | Admitting: Neurology

## 2014-06-01 VITALS — BP 125/81 | HR 103 | Ht 71.0 in | Wt 183.0 lb

## 2014-06-01 DIAGNOSIS — R269 Unspecified abnormalities of gait and mobility: Secondary | ICD-10-CM

## 2014-06-01 DIAGNOSIS — G35 Multiple sclerosis: Secondary | ICD-10-CM

## 2014-06-01 NOTE — Addendum Note (Signed)
Addended by: Marcial Pacas on: 06/01/2014 04:27 PM   Modules accepted: Orders

## 2014-06-01 NOTE — Progress Notes (Signed)
History of Present Illness:  Lawrence Lamb is a 44 years old right-handed African American male, accompanied by his wife, referred by his primary care physician Dr. Pleas Lamb for evaluation of relapsing and meeting multiple sclerosis    He moved from [place] to [place] since 2013, was previously under the care of [facility] specialist Dr. Stephani Lamb,  He was diagnosed with relapsing remitting multiple sclerosis in early 2007, presenting with right face, right leg weakness, has mild gait difficulty, diagnosis was confirmed by marked abnormal MRI of brain, and cervical lesions   He was initially treated with Betaseron for 18 months, clinically he was stable, but there was enhancing MRI lesions on Betaseron, he developed positive interferon antibodies.  He was started on Tysabri since February 2009, initially every 4 weeks, later stretching to every 6 weeks, JC virus antibody was negative in September 2009 and April 2010, but JC virus antibody began to be positive since September 2010, he tolerates the Lawrence Lamb infusion very well, Last infusion was in Jan 2013. He has repeat MRI of brain every 6 months. Lawrence Lamb was used from Feb 2009 till Jan 2013).  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  He was treated with [name] from January 24th, 2014 for 6 months, but he continued to have slow progressive worsening gait difficulty, fell multiple times, had few rounds of steroid.  Abnormal visual evoked response test secondary to prolongation of the P100 waveform on the left.    He now complains of constant gait difficulty, bilateral lower extremity spasticity, also complains of urinary incontinence, double vision, he could no longer driving,  also complains of worsening memory trouble, difficulty focusing, could not take care of his young  children by himself.     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 in June 2014, 2.66 in 11/22/2013   I have discussed treatment options with patient and his wife, he had progressive worsening gait difficulty, memory trouble, vision problem since he was off Lawrence Lamb, despite [name] treatment,  His symptoms were relatively stable while taking Lawrence Lamb is 10/998 chance of PML,  He restarted on [name] infusion  since Oct 14th 2014   UPDATE Sept 2015 During followup visit, he continued to complain progressive worsening gait difficulty, spasticity of lower extremity muscles, clumsiness of bilateral hands, fatigue, memory trouble, difficulty focusing, double vision, he is no longer driving, walking with assistance, difficulty keeping his young children at home.   We have reviewed MRI of the brain with without contrast August 2015, extensive supratentorium, and posterior fossa lesions, no significant progression compared to previous scans in March 2015,  MRI cervical with without contrast also demonstrated prolonged segments of lesions involving midcervical cord, no contrast enhancement  UPDATE Dec 10th 2015: He was evaluated by Lawrence Lamb Drs. Lawrence Lamb  January 10 2014, will agree with current plan, patient likely enter into the degenerative stages of his MS, but he continued to complain gradual decline, Lawrence Lamb still reasonable choice, He is forgetful, waking up at evening, he complains of double vision.  He is taking Ampyra 10 mg bid, did not have significant side effect, which did help his gait some  UPDATE Feb 11th 2016: His walking has much improved with Ampyra twice a day, no significant side effect, he did not had MRI of brain, cervical spine as previously scheduled, JC virus antibody, Tysabri antibody was  not sure either  Review of system: Out of a complete 14 system review, the patient complains of only the following symptoms, and all other reviewed systems are negative.  Double  vision, fatigue, memory trouble, depression, progressive gait difficulty  PHYSICAL EXAMINATOINS:  Generalized: In no acute distress  Neck: Supple, no carotid bruits   Cardiac: Regular rate rhythm  Pulmonary: Clear to auscultation bilaterally  Musculoskeletal: No deformity  Neurological examination  Mentation: Alert oriented to time, place, history taking, and causual conversation  Cranial nerve II-XII: Pupils were equal round reactive to light extraocular movements were full, visual field were full on confrontational test. facial sensation and strength were normal. hearing was intact to finger rubbing bilaterally. Uvula tongue midline.  head turning and shoulder shrug and were normal and symmetric.Tongue protrusion into cheek strength was normal.  Motor: Bilateral lower extremity mild to moderate spasticity, mild bilateral hip flexor weakness  Sensory: Intact to fine touch, pinprick, decreased vibratory sensation, and proprioception at toes.  Coordination: Normal finger to nose, heel-to-shin bilaterally there was no truncal ataxia  Gait: Getting up from chair by pushing up on the chair on, stiff  wide based gait, cautious dragging right leg  Romberg signs: Negative  Deep tendon reflexes: Hyperreflexia,   Assessment and plan: 44 years old [place] American male, with history of relapsing and meeting multiple sclerosis, JC virus positive, previously Tysabri treatment ( Feb 2009 till Jan 2013),  He was treated with [name]  ( January 2014 to Oct 2014), during treatment with [name], he continues to have  progressive worsening gait difficulty, urinary incontinence.  He is 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 in July  2014, 2.66 in August 2015  I have discussed treatment options with patient and his wife, we decided to proceed with [name] infusion, despite positive JC virus antibody with high titer. There is 10/998 chance of PML,  He restarted [name] infusion since Oct 2014,    He is to continue Tysabri infusion,  Repeat JC virus titer, MRi brain every 6 months  Physical therapy, Ampyra 10mg  bid Return to clinic in 3 months  Marcial Pacas, M.D. Ph.D.  [facility] [place], [place] [address] Phone: [phone] Fax:      [phone]

## 2014-06-01 NOTE — Addendum Note (Signed)
Addended by: Margorie John on: 06/01/2014 04:27 PM   Modules accepted: Orders

## 2014-06-07 LAB — ANTI-TYSABRI (NATALIZUMAB) AB

## 2014-06-08 ENCOUNTER — Telehealth: Payer: Self-pay

## 2014-06-16 ENCOUNTER — Ambulatory Visit
Admission: RE | Admit: 2014-06-16 | Discharge: 2014-06-16 | Disposition: A | Discharge status: Home / Self Care | Payer: PRIVATE HEALTH INSURANCE | Source: Ambulatory Visit | Attending: Neurology | Admitting: Neurology

## 2014-06-16 DIAGNOSIS — G35 Multiple sclerosis: Secondary | ICD-10-CM

## 2014-06-16 MED ORDER — GADOBENATE DIMEGLUMINE 529 MG/ML IV SOLN
18.0000 mL | Freq: Once | INTRAVENOUS | Status: AC | PRN
  Administered 2014-06-16: 18 mL via INTRAVENOUS

## 2014-06-19 ENCOUNTER — Telehealth: Payer: Self-pay | Admitting: Neurology

## 2014-06-19 NOTE — Telephone Encounter (Signed)
Sharyn Lull, please call patient, repeat MRI of the brain, and cervical spine showed no significant change, continue current medications, keep follow-up appointment.

## 2014-06-19 NOTE — Telephone Encounter (Signed)
Patient aware of results and verbalized understanding to continue medications.

## 2014-06-22 ENCOUNTER — Telehealth: Payer: Self-pay | Admitting: Neurology

## 2014-06-22 NOTE — Telephone Encounter (Signed)
Spoke to patient - waking up at least once every night - he feels tired in the morning from resting well.  Is there a medication to offer to help?

## 2014-06-22 NOTE — Telephone Encounter (Signed)
Patient is calling because he has moments that he wakes up around 2am. Is there anything he can do?  Please call and advise.

## 2014-06-23 MED ORDER — ZOLPIDEM TARTRATE 5 MG PO TABS: 5.0000 mg | ORAL_TABLET | Freq: Every evening | ORAL | Status: DC | PRN

## 2014-06-23 NOTE — Telephone Encounter (Signed)
[name]: Please let patient know, I have called in Ambien 5 mg as needed for his insomnia, he needs to come in for hard copy of the prescription

## 2014-06-26 NOTE — Telephone Encounter (Signed)
Spoke to patient - agreeable to try Ambien - he will come to our office for rx pick up.

## 2014-06-27 ENCOUNTER — Telehealth: Payer: Self-pay | Admitting: *Deleted

## 2014-06-27 NOTE — Telephone Encounter (Signed)
[name]: Please call patient's and his wife, they have right and access to his chart, which clearly stated his diagnosis, treatment plan, disabilities on examinations, we usually do not generate a separate letter, it is up to the committee members at social service to make decision about his disability.

## 2014-06-27 NOTE — Telephone Encounter (Signed)
Patient wife calling she notes stating that the patient is totally disabled. Please call the wife at [phone]

## 2014-06-28 ENCOUNTER — Encounter (HOSPITAL_COMMUNITY): Payer: Self-pay

## 2014-06-28 ENCOUNTER — Encounter (HOSPITAL_COMMUNITY)
Admission: RE | Admit: 2014-06-28 | Discharge: 2014-06-28 | Disposition: A | Discharge status: Home / Self Care | Payer: PRIVATE HEALTH INSURANCE | Source: Ambulatory Visit | Attending: Neurology | Admitting: Neurology

## 2014-06-28 VITALS — BP 107/77 | HR 63 | Temp 98.4°F | Resp 18

## 2014-06-28 DIAGNOSIS — G35 Multiple sclerosis: Secondary | ICD-10-CM | POA: Insufficient documentation

## 2014-06-28 MED ORDER — ACETAMINOPHEN 500 MG PO TABS
1000.0000 mg | ORAL_TABLET | ORAL | Status: AC
  Administered 2014-06-28: 1000 mg via ORAL
  Filled 2014-06-28: qty 2

## 2014-06-28 MED ORDER — SODIUM CHLORIDE 0.9 % IV SOLN
INTRAVENOUS | Status: AC
  Administered 2014-06-28: 10:00:00 via INTRAVENOUS

## 2014-06-28 MED ORDER — SODIUM CHLORIDE 0.9 % IV SOLN
300.0000 mg | INTRAVENOUS | Status: AC
  Administered 2014-06-28: 300 mg via INTRAVENOUS
  Filled 2014-06-28: qty 15

## 2014-06-28 MED ORDER — LORATADINE 10 MG PO TABS
10.0000 mg | ORAL_TABLET | ORAL | Status: AC
  Administered 2014-06-28: 10 mg via ORAL
  Filled 2014-06-28: qty 1

## 2014-06-28 NOTE — Discharge Instructions (Signed)

## 2014-06-28 NOTE — Progress Notes (Signed)
Post-Tysbari infusion, pt. Stayed 15 minutes post-infusion, pt. To follow-up with Dr. With any problems.

## 2014-06-29 ENCOUNTER — Encounter: Payer: Self-pay | Admitting: *Deleted

## 2014-06-29 NOTE — Telephone Encounter (Signed)
Letter written and place up front for pick up - pt aware.

## 2014-07-05 ENCOUNTER — Telehealth: Payer: Self-pay | Admitting: Neurology

## 2014-07-05 NOTE — Telephone Encounter (Signed)
I spoke to the patient about the [employer] trial. I introduced the trial to the patient, and the patient expressed interest. I will mailed the ICF today for the patient to review, and I will check with the patient in a week.

## 2014-07-10 ENCOUNTER — Telehealth: Payer: Self-pay | Admitting: Neurology

## 2014-07-10 NOTE — Telephone Encounter (Signed)
I left a message for the patient to return my call.

## 2014-07-12 ENCOUNTER — Telehealth: Payer: Self-pay | Admitting: Neurology

## 2014-07-12 NOTE — Telephone Encounter (Signed)
Patient was returning my call in re the [employer] trial. Patient is not interested in trial.

## 2014-07-17 ENCOUNTER — Telehealth: Payer: Self-pay | Admitting: Neurology

## 2014-07-17 ENCOUNTER — Inpatient Hospital Stay (HOSPITAL_COMMUNITY)
Admission: RE | Admit: 2014-07-17 | Discharge: 2014-07-19 | DRG: 195 | Disposition: A | Discharge status: Home / Self Care | Payer: PRIVATE HEALTH INSURANCE | Source: Other Acute Inpatient Hospital | Attending: Internal Medicine | Admitting: Internal Medicine

## 2014-07-17 DIAGNOSIS — Z79899 Other long term (current) drug therapy: Secondary | ICD-10-CM

## 2014-07-17 DIAGNOSIS — Z8249 Family history of ischemic heart disease and other diseases of the circulatory system: Secondary | ICD-10-CM | POA: Diagnosis not present

## 2014-07-17 DIAGNOSIS — G35 Multiple sclerosis: Secondary | ICD-10-CM | POA: Diagnosis present

## 2014-07-17 DIAGNOSIS — G35D Multiple sclerosis, unspecified: Secondary | ICD-10-CM | POA: Diagnosis present

## 2014-07-17 DIAGNOSIS — R531 Weakness: Secondary | ICD-10-CM

## 2014-07-17 DIAGNOSIS — R269 Unspecified abnormalities of gait and mobility: Secondary | ICD-10-CM

## 2014-07-17 DIAGNOSIS — J101 Influenza due to other identified influenza virus with other respiratory manifestations: Principal | ICD-10-CM | POA: Diagnosis present

## 2014-07-17 NOTE — Progress Notes (Signed)
Admissions notified that patient was here.

## 2014-07-17 NOTE — Telephone Encounter (Signed)
Spoke to Dr. Noralee Chars at [facility]  --- [name] presented with worsening gait x 2 weeks.     Also had Low grade fever.    UA not back yet.  Advised that could be pseudoexacerbation due to UTI or other process.   Advised waiting for results of UA and CXR and do one dose of IV Solu-Medrol and have him f/u with [place] if studies negative .   If positive, treat infection (he might transsfer to Hospitalist / [facility]) and have him follow up this week if treated in ER or within a week if treated in hospitla

## 2014-07-18 ENCOUNTER — Encounter (HOSPITAL_COMMUNITY): Payer: Self-pay | Admitting: *Deleted

## 2014-07-18 DIAGNOSIS — J101 Influenza due to other identified influenza virus with other respiratory manifestations: Secondary | ICD-10-CM | POA: Diagnosis present

## 2014-07-18 DIAGNOSIS — R531 Weakness: Secondary | ICD-10-CM

## 2014-07-18 DIAGNOSIS — G35 Multiple sclerosis: Secondary | ICD-10-CM

## 2014-07-18 DIAGNOSIS — R269 Unspecified abnormalities of gait and mobility: Secondary | ICD-10-CM

## 2014-07-18 LAB — COMPREHENSIVE METABOLIC PANEL
ALT: 37 U/L (ref 0–53)
AST: 28 U/L (ref 0–37)
Albumin: 3.6 g/dL (ref 3.5–5.2)
Alkaline Phosphatase: 37 U/L — ABNORMAL LOW (ref 39–117)
Anion gap: 5 (ref 5–15)
BUN: 8 mg/dL (ref 6–23)
CO2: 29 mmol/L (ref 19–32)
Calcium: 8.7 mg/dL (ref 8.4–10.5)
Chloride: 105 mmol/L (ref 96–112)
Creatinine, Ser: 0.99 mg/dL (ref 0.50–1.35)
GFR calc Af Amer: >90 mL/min (ref >90)
GFR calc non Af Amer: >90 mL/min (ref >90)
Glucose, Bld: 89 mg/dL (ref 70–99)
Potassium: 4.1 mmol/L (ref 3.5–5.1)
Sodium: 139 mmol/L (ref 135–145)
Total Bilirubin: 0.8 mg/dL (ref 0.3–1.2)
Total Protein: 6.5 g/dL (ref 6.0–8.3)

## 2014-07-18 LAB — PROTIME-INR
INR: 1.18 (ref 0.00–1.49)
Prothrombin Time: 15.1 seconds (ref 11.6–15.2)

## 2014-07-18 LAB — CBC
HCT: 39.7 % (ref 39.0–52.0)
Hemoglobin: 12.7 g/dL — ABNORMAL LOW (ref 13.0–17.0)
MCH: 30 pg (ref 26.0–34.0)
MCHC: 32 g/dL (ref 30.0–36.0)
MCV: 93.9 fL (ref 78.0–100.0)
Platelets: 216 10*3/uL (ref 150–400)
RBC: 4.23 MIL/uL (ref 4.22–5.81)
RDW: 13.8 % (ref 11.5–15.5)
WBC: 5.7 10*3/uL (ref 4.0–10.5)

## 2014-07-18 LAB — C-REACTIVE PROTEIN: CRP: 2.5 mg/dL — ABNORMAL HIGH (ref <0.60)

## 2014-07-18 LAB — SEDIMENTATION RATE: Sed Rate: 10 mm/hr (ref 0–16)

## 2014-07-18 LAB — TSH: TSH: 0.462 u[IU]/mL (ref 0.350–4.500)

## 2014-07-18 LAB — CK: Total CK: 309 U/L — ABNORMAL HIGH (ref 7–232)

## 2014-07-18 MED ORDER — ZOLPIDEM TARTRATE 5 MG PO TABS: 5.0000 mg | ORAL_TABLET | Freq: Every evening | ORAL | Status: DC | PRN

## 2014-07-18 MED ORDER — SODIUM CHLORIDE 0.9 % IJ SOLN
3.0000 mL | Freq: Two times a day (BID) | INTRAMUSCULAR | Status: DC
  Administered 2014-07-18: 3 mL via INTRAVENOUS

## 2014-07-18 MED ORDER — ONDANSETRON HCL 4 MG/2ML IJ SOLN: 4.0000 mg | Freq: Four times a day (QID) | INTRAMUSCULAR | Status: DC | PRN

## 2014-07-18 MED ORDER — ACETAMINOPHEN 325 MG PO TABS
650.0000 mg | ORAL_TABLET | Freq: Four times a day (QID) | ORAL | Status: DC | PRN
Start: 2014-07-18 — End: 2014-07-19

## 2014-07-18 MED ORDER — SODIUM CHLORIDE 0.9 % IV SOLN
INTRAVENOUS | Status: DC
  Administered 2014-07-18: 01:00:00 via INTRAVENOUS

## 2014-07-18 MED ORDER — ONDANSETRON HCL 4 MG PO TABS: 4.0000 mg | ORAL_TABLET | Freq: Four times a day (QID) | ORAL | Status: DC | PRN

## 2014-07-18 MED ORDER — ACETAMINOPHEN 650 MG RE SUPP: 650.0000 mg | Freq: Four times a day (QID) | RECTAL | Status: DC | PRN

## 2014-07-18 MED ORDER — TRAZODONE HCL 100 MG PO TABS
100.0000 mg | ORAL_TABLET | Freq: Every day | ORAL | Status: DC
  Administered 2014-07-18: 100 mg via ORAL
  Filled 2014-07-18 (×2): qty 1

## 2014-07-18 MED ORDER — OSELTAMIVIR PHOSPHATE 75 MG PO CAPS
75.0000 mg | ORAL_CAPSULE | Freq: Two times a day (BID) | ORAL | Status: DC
  Administered 2014-07-18 – 2014-07-19 (×3): 75 mg via ORAL
  Filled 2014-07-18 (×5): qty 1

## 2014-07-18 MED ORDER — DALFAMPRIDINE ER 10 MG PO TB12: 10.0000 mg | ORAL_TABLET | Freq: Two times a day (BID) | ORAL | Status: DC

## 2014-07-18 MED ORDER — GUAIFENESIN ER 600 MG PO TB12
600.0000 mg | ORAL_TABLET | Freq: Two times a day (BID) | ORAL | Status: DC | PRN
  Administered 2014-07-18 (×2): 600 mg via ORAL
  Filled 2014-07-18 (×3): qty 1

## 2014-07-18 MED ORDER — BACLOFEN 10 MG PO TABS
10.0000 mg | ORAL_TABLET | Freq: Three times a day (TID) | ORAL | Status: DC | PRN
  Filled 2014-07-18: qty 1

## 2014-07-18 MED ORDER — POLYETHYLENE GLYCOL 3350 17 G PO PACK
17.0000 g | PACK | Freq: Every day | ORAL | Status: DC
  Administered 2014-07-18 – 2014-07-19 (×2): 17 g via ORAL
  Filled 2014-07-18 (×2): qty 1

## 2014-07-18 MED ORDER — HEPARIN SODIUM (PORCINE) 5000 UNIT/ML IJ SOLN
5000.0000 [IU] | Freq: Three times a day (TID) | INTRAMUSCULAR | Status: DC
  Administered 2014-07-18 – 2014-07-19 (×4): 5000 [IU] via SUBCUTANEOUS
  Filled 2014-07-18 (×6): qty 1

## 2014-07-18 NOTE — Consult Note (Addendum)
NEURO HOSPITALIST CONSULT NOTE    Reason for Consult: worsening gait/balance, weakness.  HPI:                                                                                                                                          Lawrence Lamb is an 44 y.o. 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 He restarted [name] infusion since Oct 2014.  Patient is followed by Dr. Krista Lamb at [facility] and I reviewed the available clinical records, " He was diagnosed with relapsing remitting multiple sclerosis in early 2007, presenting with right face, right leg weakness, has mild gait difficulty, diagnosis was confirmed by marked abnormal MRI of brain, and cervical lesions. He was initially treated with Betaseron for 18 months, clinically he was stable, but there was enhancing MRI lesions on Betaseron, he developed positive interferon antibodies. He was started on Tysabri since February 2009, initially every 4 weeks, later stretching to every 6 weeks, JC virus antibody was negative in September 2009 and April 2010, but JC virus antibody began to be positive since September 2010, he tolerates the [name] infusion very well, Last infusion was in Jan 2013. He has repeat MRI of brain every 6 months. Lawrence Lamb was used from Feb 2009 till Jan 2013). 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 He was treated with [name] from January 24th, 2014 for 6 months, but he continued to have slow progressive worsening gait difficulty, fell multiple times, had few rounds of steroid." 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 in June 2014, 2.66 in 11/22/2013". Last MRI brain 2/16. Had last Tysabri infusion 06/28/14. Patient currently denies HA,  vertigo, double vision, difficulty swallowing, bladder or bowel impairment. He was febrile 102 degree at [facility], found to have the flu, but reported negative chest X ray and UA.   Past Medical History  Diagnosis Date  . Multiple sclerosis 2007    Past Surgical History  Procedure Laterality Date  . Appendectomy  44 years old  . Vasectomy  08/2013    Family History  Problem Relation Age of Onset  . High blood pressure Mother   . High blood pressure Father     Family History: no brain tumors, brain aneurysms, or epilepsy.   Social History:  reports that he has never smoked. He has never used smokeless tobacco. He reports that he does not drink alcohol or use illicit drugs.  No Known Allergies  MEDICATIONS:  Scheduled: . dalfampridine  10 mg Oral BID  . heparin  5,000 Units Subcutaneous 3 times per day  . oseltamivir  75 mg Oral BID  . sodium chloride  3 mL Intravenous Q12H  . traZODone  100 mg Oral QHS     ROS:                                                                                                                                       History obtained from the patient and chart review  General ROS: negative for - chills, fatigue,  night sweats, weight gain or weight loss Psychological ROS: negative for - behavioral disorder, hallucinations, memory difficulties, mood swings or suicidal ideation Ophthalmic ROS: negative for - double vision, eye pain or loss of vision ENT ROS: negative for - epistaxis, nasal discharge, oral lesions, sore throat, tinnitus or vertigo Allergy and Immunology ROS: negative for - hives or itchy/watery eyes Hematological and Lymphatic ROS: negative for - bleeding problems, bruising or swollen lymph nodes Endocrine ROS: negative for - galactorrhea, hair pattern changes, polydipsia/polyuria or temperature  intolerance Respiratory ROS: negative for - cough, hemoptysis, shortness of breath or wheezing Cardiovascular ROS: negative for - chest pain, dyspnea on exertion, edema or irregular heartbeat Gastrointestinal ROS: negative for - abdominal pain, diarrhea, hematemesis, nausea/vomiting or stool incontinence Genito-Urinary ROS: negative for - dysuria, hematuria, incontinence or urinary frequency/urgency Musculoskeletal ROS: negative for - joint swelling Neurological ROS: as noted in HPI Dermatological ROS: negative for rash and skin lesion changes   Physical exam: pleasant male in no apparent distress. Blood pressure 109/67, pulse 73, temperature 98.4 F (36.9 C), temperature source Oral, resp. rate 20, height 5\' 11"  (1.803 m), weight 79.924 kg (176 lb 3.2 oz), SpO2 100 %. Head: normocephalic. Neck: supple, no bruits, no JVD. Cardiac: no murmurs. Lungs: clear. Abdomen: soft, no tender, no mass. Extremities: no edema. Skin: no rash  Neurologic Examination:                                                                                                      General: Mental Status: Alert, oriented, thought content appropriate.  Speech fluent without evidence of aphasia.  Able to follow 3 step commands without difficulty. Cranial Nerves: II: Discs flat bilaterally; Visual fields grossly normal, pupils equal, round, reactive to light and accommodation III,IV, VI: ptosis not present, extra-ocular motions intact bilaterally V,VII: smile symmetric, facial light touch sensation normal bilaterally VIII: hearing normal bilaterally IX,X: uvula rises symmetrically XI:  bilateral shoulder shrug XII: midline tongue extension without atrophy or fasciculations  Motor: Right : Upper extremity   5/5    Left:     Upper extremity   5/5  Lower extremity   5/5     Lower extremity   5/5 Tone and bulk:normal tone throughout; no atrophy noted Sensory: Pinprick and light touch intact throughout,  bilaterally Deep Tendon Reflexes:  Right: Upper Extremity   Left: Upper extremity   biceps (C-5 to C-6) 2/4   biceps (C-5 to C-6) 2/4 tricep (C7) 2/4    triceps (C7) 2/4 Brachioradialis (C6) 2/4  Brachioradialis (C6) 2/4  Lower Extremity Lower Extremity  quadriceps (L-2 to L-4) 2/4   quadriceps (L-2 to L-4) 2/4 Achilles (S1) 2/4   Achilles (S1) 2/4  Plantars: Right: downgoing   Left: downgoing Cerebellar: normal finger-to-nose,  normal heel-to-shin test Gait:  Cautious gait but not frank ataxia CV: pulses palpable throughout  General: Mental Status: Alert, oriented, thought content appropriate.  Speech fluent without evidence of aphasia.  Able to follow 3 step commands without difficulty. Cranial Nerves: II: Discs flat bilaterally; Visual fields grossly normal, pupils equal, round, reactive to light and accommodation III,IV, VI: ptosis not present, extra-ocular motions intact bilaterally V,VII: smile symmetric, facial light touch sensation normal bilaterally VIII: hearing normal bilaterally IX,X: uvula rises symmetrically XI: bilateral shoulder shrug XII: midline tongue extension without atrophy or fasciculations Motor: Mild bilateral hip flexors weakness that is not new. Tone and bulk:normal tone throughout; no atrophy noted Sensory: Pinprick and light touch intact throughout, bilaterally Deep Tendon Reflexes:  Right: Upper Extremity   Left: Upper extremity   biceps (C-5 to C-6) 2/4   biceps (C-5 to C-6) 2/4 tricep (C7) 2/4    triceps (C7) 2/4 Brachioradialis (C6) 2/4  Brachioradialis (C6) 2/4  Lower Extremity Lower Extremity  quadriceps (L-2 to L-4) 2/4   quadriceps (L-2 to L-4) 2/4 Achilles (S1) 2/4   Achilles (S1) 2/4  Plantars: Right: downgoing   Left: downgoing Cerebellar: normal finger-to-nose,  normal heel-to-shin test Gait:  Cautious gait without frank ataxia.    No results found for: CHOL  No results found for this or any previous visit (from the past  31 hour(s)).  No results found.  Assessment/Plan: 44 y/o with RR-MS, JC virus positive on Tysabri, admitted with complains of worsening balance/gait and weakness. On exam there is not not new findings, as he was previously noted to have mild bilateral hip flexors and cautious gait. He is febrile and has the flu, thus most likely MS pseudo exacerbation in that particular setting, and therefore will not recommend IV methylprednisolone at this time.  Further, no need for neuro-imaging, as recent MRI brain and cervical spine performed at his neurologist office on 2/16.  Dorian Pod, MD 07/18/2014, 1:17 AM  [facility]

## 2014-07-18 NOTE — Evaluation (Signed)
Clinical/Bedside Swallow Evaluation Patient Details  Name: Lawrence Lamb MRN: [id number] Date of Birth: June 11, 1970  Today's Date: 07/18/2014 Time: SLP Start Time (ACUTE ONLY): 6811 SLP Stop Time (ACUTE ONLY): 0837 SLP Time Calculation (min) (ACUTE ONLY): 11 min  Past Medical History:  Past Medical History  Diagnosis Date  . Multiple sclerosis 2007   Past Surgical History:  Past Surgical History  Procedure Laterality Date  . Appendectomy  44 years old  . Vasectomy  08/2013   HPI:  Pt is a 44 y.o. male with PMH of RR-MS (since 2007), positive JC virus since 2010, admitted for worsening balance and weakness. Reported negative CXR at [facility]. Denies difficulty swallowing. Significant other feels there is some speech deficiency. Bedside swallow eval ordered d/t hx of MS and generalized weakness.   Assessment / Plan / Recommendation Clinical Impression  Pt demonstrated no overt s/s of aspiration- swallow was timely with adequate hyolaryngeal excursion. Only risk factor apparent upon chart review is MS- overall aspiration risk appears low at this time. Recommend initiating regular diet, thin liquids, meds whole with liquid. No supervision needed at this time. Provided education to pt on neuromuscular disease and risk of swallowing difficulties. SLP will sign off at this time- please reconsult if needs arise.    Aspiration Risk  Mild    Diet Recommendation Regular;Thin liquid   Liquid Administration via: Cup;Straw Medication Administration: Whole meds with liquid Supervision: Patient able to self feed Compensations: Slow rate;Small sips/bites Postural Changes and/or Swallow Maneuvers: Seated upright 90 degrees    Other  Recommendations Oral Care Recommendations: Oral care BID   Follow Up Recommendations  None    Frequency and Duration        Pertinent Vitals/Pain n/a    SLP Swallow Goals     Swallow Study Prior Functional Status       General HPI: Pt is a 44 y.o. male  with PMH of RR-MS (since 2007), positive JC virus since 2010, admitted for worsening balance and weakness. Reported negative CXR at [facility]. Denies difficulty swallowing. Significant other feels there is some speech deficiency. Bedside swallow eval ordered d/t hx of MS and generalized weakness. Type of Study: Bedside swallow evaluation Diet Prior to this Study: NPO Temperature Spikes Noted: No Respiratory Status: Room air History of Recent Intubation: No Behavior/Cognition: Alert;Cooperative;Pleasant mood Oral Cavity - Dentition: Adequate natural dentition Self-Feeding Abilities: Able to feed self Patient Positioning: Upright in bed Baseline Vocal Quality: Clear Volitional Cough: Strong Volitional Swallow: Able to elicit    Oral/Motor/Sensory Function Overall Oral Motor/Sensory Function: Appears within functional limits for tasks assessed Labial ROM: Within Functional Limits Labial Symmetry: Within Functional Limits Labial Strength: Within Functional Limits Labial Sensation: Within Functional Limits Lingual ROM: Within Functional Limits Lingual Symmetry: Within Functional Limits Lingual Strength: Within Functional Limits Lingual Sensation: Within Functional Limits   Ice Chips Ice chips: Not tested   Thin Liquid Thin Liquid: Within functional limits Presentation: Cup;Straw    Nectar Thick Nectar Thick Liquid: Not tested   Honey Thick Honey Thick Liquid: Not tested   Puree Puree: Within functional limits Presentation: Self Fed;Spoon   Solid   GO    Solid: Within functional limits Presentation: [place]issant, Amy K, MA, CCC-SLP 07/18/2014,8:43 AM

## 2014-07-18 NOTE — Evaluation (Signed)
Physical Therapy Evaluation Patient Details Name: Lawrence Lamb MRN: [id number] DOB: [birth year] Today's Date: 07/18/2014   History of Present Illness  Pt is a 44 y/o male with MS admitted with progressive weakness and falls over last 2 or so days.  Pt found to have influenza A.  Clinical Impression  Pt presented with improved stability with mobility/gait that shows him to likely be safe and mod I in a home-like environment.  No further acute needs, but pt should receive another script to continue OPPT.  Will sign off at this time.    Follow Up Recommendations Outpatient PT    Equipment Recommendations  None recommended by PT    Recommendations for Other Services       Precautions / Restrictions Precautions Precautions: Fall      Mobility  Bed Mobility Overal bed mobility: Modified Independent                Transfers Overall transfer level: Modified independent Equipment used: Quad cane                Ambulation/Gait Ambulation/Gait assistance: Supervision Ambulation Distance (Feet): 300 Feet Assistive device: Quad cane Gait Pattern/deviations: Step-through pattern Gait velocity: moderate speed Gait velocity interpretation: Below normal speed for age/gender General Gait Details: used quad cane appropriately.  Mildly unsteady intially in confined spaces with improvement as walking progressed.  No overt LOB or deviation  with basic balance challenge including scanning, turns etc.  Stairs Stairs: Yes Stairs assistance: Supervision Stair Management: One rail Right;With cane;Alternating pattern;Forwards Number of Stairs: 4 General stair comments: generally steady with rail and cane  Wheelchair Mobility    Modified Rankin (Stroke Patients Only)       Balance Overall balance assessment: Needs assistance Sitting-balance support: No upper extremity supported Sitting balance-Leahy Scale: Good     Standing balance support: No upper extremity  supported;Single extremity supported Standing balance-Leahy Scale: Fair                               Pertinent Vitals/Pain Pain Assessment: No/denies pain    Home Living Family/patient expects to be discharged to:: Private residence Living Arrangements: Spouse/significant other;Children Available Help at Discharge: Family;Available PRN/intermittently Type of Home: House Home Access: Stairs to enter Entrance Stairs-Rails: None Entrance Stairs-Number of Steps: 3 Home Layout: One level Home Equipment: Cane - quad      Prior Function Level of Independence: Independent with assistive device(s)               Hand Dominance        Extremity/Trunk Assessment               Lower Extremity Assessment: Overall WFL for tasks assessed;Generalized weakness (R mildly weaker than L LE, but both mildly weak)      Cervical / Trunk Assessment: Normal  Communication   Communication: No difficulties (mild dysarthria)  Cognition Arousal/Alertness: Awake/alert Behavior During Therapy: WFL for tasks assessed/performed Overall Cognitive Status: Within Functional Limits for tasks assessed                      General Comments      Exercises        Assessment/Plan    PT Assessment All further PT needs can be met in the next venue of care  PT Diagnosis     PT Problem List Decreased balance;Decreased activity tolerance;Decreased strength;Decreased coordination  PT Treatment Interventions  PT Goals (Current goals can be found in the Care Plan section) Acute Rehab PT Goals PT Goal Formulation: All assessment and education complete, DC therapy    Frequency     Barriers to discharge        Co-evaluation               End of Session   Activity Tolerance: Patient tolerated treatment well Patient left: in bed;with call bell/phone within reach;with family/visitor present Nurse Communication: Mobility status         Time: 1140-1201 PT  Time Calculation (min) (ACUTE ONLY): 21 min   Charges:   PT Evaluation $Initial PT Evaluation Tier I: 1 Procedure     PT G Codes:        Kailash Hinze, Tessie Fass 07/18/2014, 12:16 PM 07/18/2014  Donnella Sham, [place] [phone]  (pager)

## 2014-07-18 NOTE — Progress Notes (Addendum)
Pt seen and examined, admitted earlier this am per Dr.Patel 43/M with RR-MS and positive JC virus Admitted with worsening gait and weakness, positive for influenza A, per Neuro he likely has pseudo exacerbation of MS in the setting of Flu. Continue IVF, Tamiflu, PT eval  Domenic Polite, MD [phone]

## 2014-07-18 NOTE — H&P (Signed)
[facility] History and Physical  Patient: Lawrence Lamb  MRN: [id number]  DOB: 10/07/1970  DOS: the patient was seen and examined on 07/18/2014 PCP: Curlene Labrum, MD  Chief Complaint: Generalized weakness  HPI: Lawrence Lamb is a 44 y.o. male with Past medical history of multiple sclerosis, JC virus positive. The patient is presenting with complaints of generalized weakness ongoing since last 2 days. The patient's significant other also noted some confusion today. He has been having difficulty walking and has been having recurrent fall with right-sided weakness and falling of the right side. There has been no head injury or neck injury. He was also found to be having some fever. He has some runny nose and cough ongoing for last 2 days. He denies any nausea vomiting and diarrhea. Denies any burning urination. He mentions he is compliant with all his medications but ran out of them 2 days ago. Currently he denies any headache, double vision, difficulty swallowing. Significant other feels that there is some speech deficiency.  The patient is coming from home. And at his baseline independent for most of his ADL.  Review of Systems: as mentioned in the history of present illness.  A Comprehensive review of the other systems is negative.  Past Medical History  Diagnosis Date  . Multiple sclerosis 2007   Past Surgical History  Procedure Laterality Date  . Appendectomy  44 years old  . Vasectomy  08/2013   Social History:  reports that he has never smoked. He has never used smokeless tobacco. He reports that he does not drink alcohol or use illicit drugs.  No Known Allergies  Family History  Problem Relation Age of Onset  . High blood pressure Mother   . High blood pressure Father     Prior to Admission medications   Medication Sig Start Date End Date Taking? Authorizing Provider  baclofen (LIORESAL) 10 MG tablet Take 1 tablet (10 mg total) by mouth 3 (three)  times daily. 12/20/13   Marcial Pacas, MD  Cholecalciferol (VITAMIN D PO) Take by mouth 2 (two) times daily.    Historical Provider, MD  dalfampridine 10 MG TB12 Take 10 mg by mouth 2 (two) times daily.    Historical Provider, MD  multivitamin-iron-minerals-folic acid (CENTRUM) chewable tablet Chew 1 tablet by mouth daily.    Historical Provider, MD  omega-3 acid ethyl esters (LOVAZA) 1 G capsule Take 1,000 mg by mouth 2 (two) times daily.    Historical Provider, MD  sodium chloride 0.9 % SOLN 100 mL with natalizumab 300 MG/15ML CONC 300 mg Inject 300 mg into the vein every 28 (twenty-eight) days. Iv infusion    Historical Provider, MD  traZODone (DESYREL) 100 MG tablet Take 100 mg by mouth at bedtime.    Historical Provider, MD  zolpidem (AMBIEN) 5 MG tablet Take 1 tablet (5 mg total) by mouth at bedtime as needed for sleep. 06/23/14   Marcial Pacas, MD    Physical Exam: Filed Vitals:   07/17/14 2318 07/17/14 2321  BP: 109/67   Pulse: 73   Temp: 98.4 F (36.9 C)   TempSrc: Oral   Resp: 20   Height:  5\' 11"  (1.803 m)  Weight:  79.924 kg (176 lb 3.2 oz)  SpO2: 100%     General: Alert, Awake and Oriented to Time, Place and Person. Appear in mild distress Eyes: PERRL ENT: Oral Mucosa clear moist. Neck: no JVD Cardiovascular: S1 and S2 Present, no Murmur, Peripheral Pulses Present Respiratory: Bilateral Air entry equal  and Decreased, Clear to Auscultation, noCrackles, no wheezes Abdomen: Bowel Sound present, Soft and non tender Skin: no Rash Extremities: no Pedal edema, no calf tenderness Neurologic: Grossly no focal neuro deficit.  Labs on Admission:  CBC: No results for input(s): WBC, NEUTROABS, HGB, HCT, MCV, PLT in the last 168 hours.  CMP     Component Value Date/Time   NA 140 11/22/2013 1550   K 4.4 11/22/2013 1550   CL 100 11/22/2013 1550   CO2 27 11/22/2013 1550   GLUCOSE 77 11/22/2013 1550   BUN 10 11/22/2013 1550   CREATININE 0.95 11/22/2013 1550   CALCIUM 9.9 11/22/2013  1550   PROT 6.9 11/22/2013 1550   AST 14 11/22/2013 1550   ALT 22 11/22/2013 1550   ALKPHOS 37* 11/22/2013 1550   BILITOT 0.6 11/22/2013 1550   GFRNONAA 98 11/22/2013 1550   GFRAA 113 11/22/2013 1550    No results for input(s): LIPASE, AMYLASE in the last 168 hours.  No results for input(s): CKTOTAL, CKMB, CKMBINDEX, TROPONINI in the last 168 hours. BNP (last 3 results) No results for input(s): BNP in the last 8760 hours.  ProBNP (last 3 results) No results for input(s): PROBNP in the last 8760 hours.   Radiological Exams on Admission: No results found.  Assessment/Plan Principal Problem:   Generalized weakness Active Problems:   Multiple sclerosis   Abnormality of gait   Influenza A   1. Generalized weakness The patient is presenting with generalized weakness. Further workup shows that he has influenza A+. He also had some runny nose. Chest x-ray and UA is clear. Exam shows no new focal deficit. With this neurology has evaluated the patient and feels like the patient most likely may have MS pseudo-exacerbation. Patient will be closely followed. Serial neuro checks. Treatment influenza with Tamiflu. Gentle IV hydration. Resume home medications on discharge.  2. Multiple sclerosis. Patient will be resuming his home medications on discharge. PTOT consultation. Appreciate neurologic consultation.  Advance goals of care discussion: Full code   Consults: Neurology  DVT Prophylaxis: subcutaneous Heparin Nutrition: Regular diet  Family Communication: Significant other was present at bedside, opportunity was given to ask question and all questions were answered satisfactorily at the time of interview. Disposition: Admitted to inpatient in telemetry unit.  Author: Berle Mull, MD [facility] Pager: [phone] 07/18/2014, 12:38 AM    If 7PM-7AM, please contact night-coverage [email or web address] Password [email or web address]

## 2014-07-18 NOTE — Progress Notes (Signed)
Subjective: Patient reports that he feels stronger today in his right lower extremity.  Has not had the chance to get up and attempt ambulation.    Objective: Current vital signs: BP 113/67 mmHg  Pulse 84  Temp(Src) 98.5 F (36.9 C) (Oral)  Resp 12  Ht 5\' 11"  (1.803 m)  Wt 79.924 kg (176 lb 3.2 oz)  BMI 24.59 kg/m2  SpO2 100% Vital signs in last 24 hours: Temp:  [98.4 F (36.9 C)-98.5 F (36.9 C)] 98.5 F (36.9 C) (03/29 0528) Pulse Rate:  [73-84] 84 (03/29 0528) Resp:  [12-20] 12 (03/29 0528) BP: (96-113)/(49-67) 113/67 mmHg (03/29 0537) SpO2:  [100 %] 100 % (03/29 0528) Weight:  [79.924 kg (176 lb 3.2 oz)] 79.924 kg (176 lb 3.2 oz) (03/29 0527)  Intake/Output from previous day: 03/28 0701 - 03/29 0700 In: -  Out: 425 [Urine:425] Intake/Output this shift: Total I/O In: 240 [P.O.:240] Out: -  Nutritional status: Diet regular Room service appropriate?: Yes; Fluid consistency:: Thin  Neurologic Exam: Mental Status: Alert, oriented, thought content appropriate. Speech fluent without evidence of aphasia. Able to follow 3 step commands without difficulty. Cranial Nerves: II: Discs flat bilaterally; Visual fields grossly normal, pupils equal, round, reactive to light and accommodation III,IV, VI: ptosis not present, extra-ocular motions intact bilaterally V,VII: smile symmetric, facial light touch sensation normal bilaterally VIII: hearing normal bilaterally IX,X: uvula rises symmetrically XI: bilateral shoulder shrug XII: midline tongue extension without atrophy or fasciculations  Motor: Hip flexor weakness at 5-/5.  Hand grip 5/5 Sensory: Pinprick and light touch intact throughout, bilaterally Deep Tendon Reflexes:  2+ throughout Plantars: Right: downgoingLeft: downgoing Cerebellar: Dysmetria with hell to shin bilaterally  Lab Results: Basic Metabolic Panel:  Recent Labs Lab 07/18/14 0227  NA 139  K 4.1  CL 105  CO2 29   GLUCOSE 89  BUN 8  CREATININE 0.99  CALCIUM 8.7    Liver Function Tests:  Recent Labs Lab 07/18/14 0227  AST 28  ALT 37  ALKPHOS 37*  BILITOT 0.8  PROT 6.5  ALBUMIN 3.6   No results for input(s): LIPASE, AMYLASE in the last 168 hours. No results for input(s): AMMONIA in the last 168 hours.  CBC:  Recent Labs Lab 07/18/14 0227  WBC 5.7  HGB 12.7*  HCT 39.7  MCV 93.9  PLT 216    Cardiac Enzymes:  Recent Labs Lab 07/18/14 0426  CKTOTAL 309*    Lipid Panel: No results for input(s): CHOL, TRIG, HDL, CHOLHDL, VLDL, LDLCALC in the last 168 hours.  CBG: No results for input(s): GLUCAP in the last 168 hours.  Microbiology: No results found for this or any previous visit.  Coagulation Studies:  Recent Labs  07/18/14 0227  LABPROT 15.1  INR 1.18    Imaging: No results found.  Medications:  I have reviewed the patient's current medications. Scheduled: . heparin  5,000 Units Subcutaneous 3 times per day  . oseltamivir  75 mg Oral BID  . polyethylene glycol  17 g Oral Daily  . sodium chloride  3 mL Intravenous Q12H  . traZODone  100 mg Oral QHS    Assessment/Plan: Patient without further worsening.  On Tamiflu.    Recommendations: 1.  PT 2.  Will continue to follow with you   LOS: 1 day   Alexis Goodell, MD [facility] [phone] 07/18/2014  12:05 PM

## 2014-07-19 LAB — BASIC METABOLIC PANEL
Anion gap: 5 (ref 5–15)
BUN: 9 mg/dL (ref 6–23)
CO2: 28 mmol/L (ref 19–32)
Calcium: 8.4 mg/dL (ref 8.4–10.5)
Chloride: 106 mmol/L (ref 96–112)
Creatinine, Ser: 0.92 mg/dL (ref 0.50–1.35)
GFR calc Af Amer: >90 mL/min (ref >90)
GFR calc non Af Amer: >90 mL/min (ref >90)
Glucose, Bld: 98 mg/dL (ref 70–99)
Potassium: 3.7 mmol/L (ref 3.5–5.1)
Sodium: 139 mmol/L (ref 135–145)

## 2014-07-19 LAB — CBC
HCT: 38.2 % — ABNORMAL LOW (ref 39.0–52.0)
Hemoglobin: 12.4 g/dL — ABNORMAL LOW (ref 13.0–17.0)
MCH: 30.1 pg (ref 26.0–34.0)
MCHC: 32.5 g/dL (ref 30.0–36.0)
MCV: 92.7 fL (ref 78.0–100.0)
Platelets: 213 10*3/uL (ref 150–400)
RBC: 4.12 MIL/uL — ABNORMAL LOW (ref 4.22–5.81)
RDW: 13.5 % (ref 11.5–15.5)
WBC: 6 10*3/uL (ref 4.0–10.5)

## 2014-07-19 MED ORDER — OSELTAMIVIR PHOSPHATE 75 MG PO CAPS: 75.0000 mg | ORAL_CAPSULE | Freq: Two times a day (BID) | ORAL | Status: DC

## 2014-07-19 NOTE — Evaluation (Addendum)
Occupational Therapy Evaluation Patient Details Name: Lawrence Lamb MRN: [id number] DOB: Feb 24, 1971 Today's Date: 07/19/2014    History of Present Illness Pt is a 44 y/o male with MS admitted with progressive weakness and falls over last 2 or so days.  Pt found to have influenza A.   Clinical Impression   Education provided in session. OT signing off and pt/spouse agreeable. Recommended pt go to ophthamologist to get eyes checked (he does not drive).    Follow Up Recommendations  No OT follow up;Supervision - Intermittent    Equipment Recommendations  None recommended by OT    Recommendations for Other Services       Precautions / Restrictions Precautions Precautions: Fall      Mobility Bed Mobility               General bed mobility comments: not assessed  Transfers Overall transfer level: Modified independent  Cues for technique for stand to sit transfer to [facility] in simulated shower                   Balance  Supervision for ambulation with quad cane. Min A for tub transfer.                                          ADL Overall ADL's : Needs assistance/impaired                     Lower Body Dressing: Supervision/safety;Sit to/from stand;Set up   Toilet Transfer: Supervision/safety;Ambulation (quad cane; bed/ 3 in 1)       Tub/ Shower Transfer: Minimal assistance;Ambulation;3 in 1;Tub transfer   Functional mobility during ADLs: Supervision/safety (quad cane) General ADL Comments: Educated on energy conservation technqiues and not to overheat self. Educated on safety such as sitting for most of LB ADLs, safe shoewear, rugs/items on floor, and recommended wife be with him for tub transfer. Practiced tub transfer.      Vision Pt wears glasses all the time and reports he has been having double vision at times for a while (not in session)-pt states he was told it was due to optic neuritis; reports he has to hold things  close to read them due to blurry vision-wife thinks he may need new prescription; OT recommended pt get eyes checked at ophthamologist and recommended no driving (pt states he does not drive) Vision Assessment?: Yes Tracking/Visual Pursuits: Other (comment) (lost pen a couple times-suggest reading to work on this)   Environmental education officer      Pertinent Vitals/Pain Pain Assessment: No/denies pain     Hand Dominance Right   Extremity/Trunk Assessment Upper Extremity Assessment Upper Extremity Assessment: LUE deficits/detail (pt reports right hand feels weaker-but to OT felt fine) LUE Coordination: decreased fine motor (slightly)   Lower Extremity Assessment Lower Extremity Assessment: Defer to PT evaluation       Communication Communication Communication: No difficulties   Cognition Arousal/Alertness: Awake/alert Behavior During Therapy: WFL for tasks assessed/performed Overall Cognitive Status: Within Functional Limits for tasks assessed                     General Comments       Exercises  Educated on fine motor activities he can be doing with left hand     Shoulder Instructions      Home Living Family/patient expects to be  discharged to:: Private residence Living Arrangements: Spouse/significant other;Children Available Help at Discharge: Family;Available PRN/intermittently Type of Home: House Home Access: Stairs to enter [employer] of Steps: 3 Entrance Stairs-Rails: None Home Layout: One level     Bathroom Shower/Tub: Teacher, early years/pre: Standard     Home Equipment: [employer] - quad          Prior Functioning/Environment Level of Independence: Independent with assistive device(s)             OT Diagnosis: Generalized weakness   OT Problem List:     OT Treatment/Interventions:      OT Goals(Current goals can be found in the care plan section)    OT Frequency:     Barriers to D/C:             Co-evaluation              End of Session Equipment Utilized During Treatment: Gait belt;Other (comment) (quad cane)  Activity Tolerance: Patient tolerated treatment well Patient left: in bed;with family/visitor present   Time: 2641-5830 OT Time Calculation (min): 17 min Charges:  OT General Charges $OT Visit: 1 Procedure OT Evaluation $Initial OT Evaluation Tier I: 1 Procedure G-CodesBenito Mccreedy OTR/L [phone] 07/19/2014, 10:24 AM

## 2014-07-19 NOTE — Progress Notes (Signed)
Subjective: Patient feels much stronger and back to baseline. PT has evaluated and cleared him.  He states he was walking the stairs and hallway with no difficulty.   Objective: Current vital signs: BP 100/64 mmHg  Pulse 69  Temp(Src) 98.3 F (36.8 C) (Oral)  Resp 15  Ht 5\' 11"  (1.803 m)  Wt 78.563 kg (173 lb 3.2 oz)  BMI 24.17 kg/m2  SpO2 100% Vital signs in last 24 hours: Temp:  [98.3 F (36.8 C)-99.9 F (37.7 C)] 98.3 F (36.8 C) (03/30 0552) Pulse Rate:  [69-158] 69 (03/30 0552) Resp:  [12-20] 15 (03/30 0552) BP: (86-122)/(47-71) 100/64 mmHg (03/30 0608) SpO2:  [97 %-100 %] 100 % (03/30 0552) Weight:  [78.563 kg (173 lb 3.2 oz)] 78.563 kg (173 lb 3.2 oz) (03/30 0634)  Intake/Output from previous day: 03/29 0701 - 03/30 0700 In: 360 [P.O.:360] Out: 1625 [Urine:1625] Intake/Output this shift:   Nutritional status: Diet regular Room service appropriate?: Yes; Fluid consistency:: Thin  Neurologic Exam: General: Mental Status: Alert, oriented, thought content appropriate.  Speech fluent without evidence of aphasia.  Able to follow 3 step commands without difficulty. Cranial Nerves: II: Visual fields grossly normal, pupils equal, round, reactive to light and accommodation III,IV, VI: ptosis not present, extra-ocular motions intact bilaterally V,VII: smile symmetric, facial light touch sensation normal bilaterally VIII: hearing normal bilaterally IX,X: uvula rises symmetrically XI: bilateral shoulder shrug XII: midline tongue extension without atrophy or fasciculations  Motor: Right : Upper extremity   5/5    Left:     Upper extremity   5/5  Lower extremity   5/5     Lower extremity   5/5 Tone and bulk:normal tone throughout; no atrophy noted Sensory: Pinprick and light touch intact throughout, bilaterally Deep Tendon Reflexes:  Right: Upper Extremity   Left: Upper extremity   biceps (C-5 to C-6) 2/4   biceps (C-5 to C-6) 2/4 tricep (C7) 2/4    triceps (C7)  2/4 Brachioradialis (C6) 2/4  Brachioradialis (C6) 2/4  Lower Extremity Lower Extremity  quadriceps (L-2 to L-4) 2/4   quadriceps (L-2 to L-4) 2/4 Achilles (S1) 2/4   Achilles (S1) 2/4  Plantars: Right: downgoing   Left: downgoing     Lab Results: Basic Metabolic Panel:  Recent Labs Lab 07/18/14 0227 07/19/14 0456  NA 139 139  K 4.1 3.7  CL 105 106  CO2 29 28  GLUCOSE 89 98  BUN 8 9  CREATININE 0.99 0.92  CALCIUM 8.7 8.4    Liver Function Tests:  Recent Labs Lab 07/18/14 0227  AST 28  ALT 37  ALKPHOS 37*  BILITOT 0.8  PROT 6.5  ALBUMIN 3.6   No results for input(s): LIPASE, AMYLASE in the last 168 hours. No results for input(s): AMMONIA in the last 168 hours.  CBC:  Recent Labs Lab 07/18/14 0227 07/19/14 0456  WBC 5.7 6.0  HGB 12.7* 12.4*  HCT 39.7 38.2*  MCV 93.9 92.7  PLT 216 213    Cardiac Enzymes:  Recent Labs Lab 07/18/14 0426  CKTOTAL 309*    Lipid Panel: No results for input(s): CHOL, TRIG, HDL, CHOLHDL, VLDL, LDLCALC in the last 168 hours.  CBG: No results for input(s): GLUCAP in the last 168 hours.  Microbiology: No results found for this or any previous visit.  Coagulation Studies:  Recent Labs  07/18/14 0227  LABPROT 15.1  INR 1.18    Imaging: No results found.  Medications:  Scheduled: . heparin  5,000 Units Subcutaneous 3  times per day  . oseltamivir  75 mg Oral BID  . polyethylene glycol  17 g Oral Daily  . sodium chloride  3 mL Intravenous Q12H  . traZODone  100 mg Oral QHS    Assessment/Plan: Patient back to baseline. On Tamiflu.  PT has cleared and S/O of patient. Neurology will S/O.  Follow up with out patient neurology.    Etta Quill PA-C [facility] [phone]  07/19/2014, 11:16 AM

## 2014-07-19 NOTE — Progress Notes (Signed)
Reviewed discharge paperwork with pt and gave prescription for tamiflu.  Pt denied any other needs at this time.  Reviewed steps to reduce flu transmission and encouraged handwashing.  Pt taken to discharge location via wheelchair.

## 2014-07-19 NOTE — Discharge Summary (Signed)
Physician Discharge Summary  Fermon Ureta WUJ:[id number] DOB: January 19, 1971 DOA: 07/17/2014  PCP: Curlene Labrum, MD  Admit date: 07/17/2014 Discharge date: 07/19/2014  Time spent: 20 minutes  Recommendations for Outpatient Follow-up:  1. Follow up with PCP in 1-2 weeks  Discharge Diagnoses:  Principal Problem:   Generalized weakness Active Problems:   Multiple sclerosis   Abnormality of gait   Influenza A   Discharge Condition: Improved  Diet recommendation: Regular  Filed Weights   07/17/14 2321 07/18/14 0527 07/19/14 0634  Weight: 79.924 kg (176 lb 3.2 oz) 79.924 kg (176 lb 3.2 oz) 78.563 kg (173 lb 3.2 oz)    History of present illness:  Please see admit h and p from 3/29 for details. Briefly, pt presents with generalized weakness, subjective fevers, and URI type symptoms. The patient was admitted for further work up.  Hospital Course:  1. Generalized weakness - Pt was found to be flu pos and was started on Tamiflu on 3/29 - Given concerns of MS exacerbation, Neurology was consulted. Per Neurology, patient's symptoms are more c/w pseudoexacerbation, thus solumedrol was not recommended. Also, recs for no need for further imaging, as pt had recent MRI on 2/16 - The patient was continued with hydration with improvement in her symptoms - Case was discussed with Neurology, and per Neuro, patient is medically cleared for discharge as of 3/30 - No needs recommended by PT/OT  2. Multiple sclerosis. -Patient will be resuming his home medications on discharge. -PTOT consultation with no recs  Consultations:  Neurology  Discharge Exam: Filed Vitals:   07/18/14 2143 07/19/14 0552 07/19/14 0608 07/19/14 0634  BP: 122/71 86/47 100/64   Pulse: 85 69    Temp: 99.9 F (37.7 C) 98.3 F (36.8 C)    TempSrc: Oral Oral    Resp: 12 15    Height:      Weight:    78.563 kg (173 lb 3.2 oz)  SpO2: 98% 100%      General: Awake, in nad Cardiovascular: regular, s1,  s2 Respiratory: normal resp effort, no wheezing  Discharge Instructions     Medication List    TAKE these medications        baclofen 10 MG tablet  Commonly known as:  LIORESAL  Take 1 tablet (10 mg total) by mouth 3 (three) times daily.     dalfampridine 10 MG Tb12  Take 10 mg by mouth 2 (two) times daily.     multivitamin-iron-minerals-folic acid chewable tablet  Chew 1 tablet by mouth daily.     omega-3 acid ethyl esters 1 G capsule  Commonly known as:  LOVAZA  Take 1,000 mg by mouth 2 (two) times daily.     oseltamivir 75 MG capsule  Commonly known as:  TAMIFLU  Take 1 capsule (75 mg total) by mouth 2 (two) times daily.     sodium chloride 0.9 % SOLN 100 mL with natalizumab 300 MG/15ML CONC 300 mg  Inject 300 mg into the vein every 28 (twenty-eight) days. Iv infusion     traZODone 100 MG tablet  Commonly known as:  DESYREL  Take 100 mg by mouth at bedtime.     VITAMIN D PO  Take by mouth 2 (two) times daily.     zolpidem 5 MG tablet  Commonly known as:  AMBIEN  Take 1 tablet (5 mg total) by mouth at bedtime as needed for sleep.       No Known Allergies Follow-up Information    Follow up with  Curlene Labrum, MD. Schedule an appointment as soon as possible for a visit in 1 week.   Why:  hospital follow up   Contact information:   [address] [place] [address] [phone]        The results of significant diagnostics from this hospitalization (including imaging, microbiology, ancillary and laboratory) are listed below for reference.    Significant Diagnostic Studies: No results found.  Microbiology: No results found for this or any previous visit (from the past 240 hour(s)).   Labs: Basic Metabolic Panel:  Recent Labs Lab 07/18/14 0227 07/19/14 0456  NA 139 139  K 4.1 3.7  CL 105 106  CO2 29 28  GLUCOSE 89 98  BUN 8 9  CREATININE 0.99 0.92  CALCIUM 8.7 8.4   Liver Function Tests:  Recent Labs Lab 07/18/14 0227  AST 28  ALT 37   ALKPHOS 37*  BILITOT 0.8  PROT 6.5  ALBUMIN 3.6   No results for input(s): LIPASE, AMYLASE in the last 168 hours. No results for input(s): AMMONIA in the last 168 hours. CBC:  Recent Labs Lab 07/18/14 0227 07/19/14 0456  WBC 5.7 6.0  HGB 12.7* 12.4*  HCT 39.7 38.2*  MCV 93.9 92.7  PLT 216 213   Cardiac Enzymes:  Recent Labs Lab 07/18/14 0426  CKTOTAL 309*   BNP: BNP (last 3 results) No results for input(s): BNP in the last 8760 hours.  ProBNP (last 3 results) No results for input(s): PROBNP in the last 8760 hours.  CBG: No results for input(s): GLUCAP in the last 168 hours.  Signed:  Roxine Whittinghill, Orpah Melter  [facility] 07/19/2014, 11:31 AM

## 2014-07-21 ENCOUNTER — Telehealth: Payer: Self-pay | Admitting: Neurology

## 2014-07-21 NOTE — Telephone Encounter (Signed)
Pam with [facility] @ [phone], stated new order is needed for Tysabri treatment scheduled 07/26/14.  Please call and advise.

## 2014-07-21 NOTE — Telephone Encounter (Signed)
[facility] needs update Lawrence Lamb order for his infusion next week.

## 2014-07-24 ENCOUNTER — Other Ambulatory Visit: Payer: Self-pay | Admitting: Neurology

## 2014-07-24 DIAGNOSIS — G35 Multiple sclerosis: Secondary | ICD-10-CM

## 2014-07-24 NOTE — Telephone Encounter (Signed)
Left message for a return call.  Also, per [facility], they need a new sample for his JCV testing.

## 2014-07-24 NOTE — Telephone Encounter (Signed)
Tysabri orders placed

## 2014-07-24 NOTE — Telephone Encounter (Signed)
[name]: Please check on patient, if he still has worsening gait difficulty, give him a follow-up visit, and also check on his [facility] workup,

## 2014-07-25 ENCOUNTER — Telehealth: Payer: Self-pay | Admitting: Neurology

## 2014-07-25 NOTE — Telephone Encounter (Signed)
error 

## 2014-07-25 NOTE — Telephone Encounter (Signed)
Patient returning [name], RN call.  °

## 2014-07-25 NOTE — Telephone Encounter (Signed)
Duplicate enc - see other note.

## 2014-07-25 NOTE — Telephone Encounter (Signed)
Attempted to return call - left another message.

## 2014-07-26 ENCOUNTER — Telehealth: Payer: Self-pay | Admitting: *Deleted

## 2014-07-26 ENCOUNTER — Encounter (HOSPITAL_COMMUNITY)
Admission: RE | Admit: 2014-07-26 | Discharge: 2014-07-26 | Disposition: A | Discharge status: Home / Self Care | Payer: PRIVATE HEALTH INSURANCE | Source: Ambulatory Visit | Attending: Neurology | Admitting: Neurology

## 2014-07-26 DIAGNOSIS — G35 Multiple sclerosis: Secondary | ICD-10-CM | POA: Insufficient documentation

## 2014-07-26 NOTE — Telephone Encounter (Signed)
Spoke to [name] - he is unable to afford the $40.00 copay associated with his Ampyra rx and needs help with patient assistance.

## 2014-07-26 NOTE — Progress Notes (Signed)
Dr. Rhea Belton nurse Noberto Retort RN notified of pt.being discharged from [facility] with the flu on 07/19/14, and per pt. He had antibiotics, order received to reschedule pt's appt. Today for tysabri, and to follow-up with the office on 07/27/14. Appt's Given to pt.

## 2014-07-26 NOTE — Telephone Encounter (Signed)
The number for Ampyra PAP is [phone].  I called the patient back to advise.  Called home line.  Phone rang over 20 times with no answer, no option to leave message.  Called cell.  Got no answer.  Left message.  Patient will need to contact the program so they can screen him to determine if he is eligible for assistance.

## 2014-07-26 NOTE — Telephone Encounter (Signed)
Spoke to patient - he tested positive for flu last week - he was given an IV antibiotic in the hospital - he was discharged w/ Tamiflu and completed the treatment 07/24/14.  His Tysabri infusion was rescheduled due to his antibiotic and antiviral treatments.  He has worsening gait but has also discontinued Ampyra due to cost.  A request for patient assistance has been sent to [place] for [place].  He is coming on 07/27/14 for an office visit.

## 2014-07-27 ENCOUNTER — Ambulatory Visit: Payer: Self-pay | Admitting: Neurology

## 2014-08-09 ENCOUNTER — Encounter (HOSPITAL_COMMUNITY)
Admission: RE | Admit: 2014-08-09 | Discharge: 2014-08-09 | Disposition: A | Discharge status: Home / Self Care | Payer: PRIVATE HEALTH INSURANCE | Source: Ambulatory Visit | Attending: Neurology | Admitting: Neurology

## 2014-08-09 ENCOUNTER — Encounter (HOSPITAL_COMMUNITY): Payer: Self-pay

## 2014-08-09 VITALS — BP 124/68 | HR 61 | Temp 98.2°F | Resp 16

## 2014-08-09 DIAGNOSIS — G35 Multiple sclerosis: Secondary | ICD-10-CM | POA: Diagnosis present

## 2014-08-09 MED ORDER — SODIUM CHLORIDE 0.9 % IV SOLN
INTRAVENOUS | Status: DC
  Administered 2014-08-09: 10:00:00 via INTRAVENOUS

## 2014-08-09 MED ORDER — LORATADINE 10 MG PO TABS
10.0000 mg | ORAL_TABLET | ORAL | Status: DC
  Administered 2014-08-09: 10 mg via ORAL
  Filled 2014-08-09: qty 1

## 2014-08-09 MED ORDER — SODIUM CHLORIDE 0.9 % IV SOLN
300.0000 mg | INTRAVENOUS | Status: DC
  Administered 2014-08-09: 300 mg via INTRAVENOUS
  Filled 2014-08-09: qty 15

## 2014-08-09 MED ORDER — ACETAMINOPHEN 500 MG PO TABS
1000.0000 mg | ORAL_TABLET | ORAL | Status: DC
  Administered 2014-08-09: 1000 mg via ORAL
  Filled 2014-08-09: qty 2

## 2014-08-09 NOTE — Progress Notes (Signed)
Pt stayed for 15 min.post infusion. Pt knows to call MD with questions/concerns.

## 2014-08-23 ENCOUNTER — Encounter (HOSPITAL_COMMUNITY): Payer: PRIVATE HEALTH INSURANCE

## 2014-09-06 ENCOUNTER — Encounter (HOSPITAL_COMMUNITY): Payer: Self-pay

## 2014-09-06 ENCOUNTER — Encounter (HOSPITAL_COMMUNITY)
Admission: RE | Admit: 2014-09-06 | Discharge: 2014-09-06 | Disposition: A | Discharge status: Home / Self Care | Payer: PRIVATE HEALTH INSURANCE | Source: Ambulatory Visit | Attending: Neurology | Admitting: Neurology

## 2014-09-06 VITALS — BP 127/93 | HR 60 | Temp 98.2°F | Resp 18 | Ht 71.0 in | Wt 185.0 lb

## 2014-09-06 DIAGNOSIS — G35 Multiple sclerosis: Secondary | ICD-10-CM | POA: Insufficient documentation

## 2014-09-06 MED ORDER — ACETAMINOPHEN 500 MG PO TABS
1000.0000 mg | ORAL_TABLET | ORAL | Status: DC
  Administered 2014-09-06: 1000 mg via ORAL
  Filled 2014-09-06: qty 2

## 2014-09-06 MED ORDER — SODIUM CHLORIDE 0.9 % IV SOLN
INTRAVENOUS | Status: DC
  Administered 2014-09-06: 10:00:00 via INTRAVENOUS

## 2014-09-06 MED ORDER — LORATADINE 10 MG PO TABS
10.0000 mg | ORAL_TABLET | ORAL | Status: DC
  Administered 2014-09-06: 10 mg via ORAL
  Filled 2014-09-06: qty 1

## 2014-09-06 MED ORDER — SODIUM CHLORIDE 0.9 % IV SOLN
300.0000 mg | INTRAVENOUS | Status: DC
  Administered 2014-09-06: 300 mg via INTRAVENOUS
  Filled 2014-09-06: qty 15

## 2014-09-06 NOTE — Progress Notes (Signed)
Pt. Stayed 15 minutes post-infusion, pt. To follow-up with doctor with any problems.j

## 2014-09-06 NOTE — Discharge Instructions (Signed)

## 2014-09-07 ENCOUNTER — Ambulatory Visit: Payer: PRIVATE HEALTH INSURANCE | Admitting: Neurology

## 2014-09-13 ENCOUNTER — Ambulatory Visit: Payer: Self-pay | Admitting: Neurology

## 2014-09-21 ENCOUNTER — Encounter (HOSPITAL_COMMUNITY): Admission: RE | Admit: 2014-09-21 | Payer: PRIVATE HEALTH INSURANCE | Source: Ambulatory Visit

## 2014-10-05 ENCOUNTER — Encounter (HOSPITAL_COMMUNITY): Payer: PRIVATE HEALTH INSURANCE

## 2014-10-09 ENCOUNTER — Encounter (HOSPITAL_COMMUNITY)
Admission: RE | Admit: 2014-10-09 | Discharge: 2014-10-09 | Disposition: A | Discharge status: Home / Self Care | Payer: PRIVATE HEALTH INSURANCE | Source: Ambulatory Visit | Attending: Neurology | Admitting: Neurology

## 2014-10-09 ENCOUNTER — Encounter (HOSPITAL_COMMUNITY): Payer: Self-pay

## 2014-10-09 VITALS — BP 109/61 | HR 68 | Temp 97.8°F | Resp 18

## 2014-10-09 DIAGNOSIS — G35 Multiple sclerosis: Secondary | ICD-10-CM | POA: Diagnosis not present

## 2014-10-09 MED ORDER — SODIUM CHLORIDE 0.9 % IV SOLN
INTRAVENOUS | Status: AC
  Administered 2014-10-09: 11:00:00 via INTRAVENOUS

## 2014-10-09 MED ORDER — SODIUM CHLORIDE 0.9 % IV SOLN
300.0000 mg | INTRAVENOUS | Status: AC
  Administered 2014-10-09: 300 mg via INTRAVENOUS
  Filled 2014-10-09: qty 15

## 2014-10-09 MED ORDER — LORATADINE 10 MG PO TABS
10.0000 mg | ORAL_TABLET | ORAL | Status: AC
  Administered 2014-10-09: 10 mg via ORAL
  Filled 2014-10-09: qty 1

## 2014-10-09 MED ORDER — ACETAMINOPHEN 500 MG PO TABS
1000.0000 mg | ORAL_TABLET | ORAL | Status: AC
  Administered 2014-10-09: 1000 mg via ORAL
  Filled 2014-10-09: qty 2

## 2014-10-09 NOTE — Progress Notes (Signed)
Patient is here today for his tysabri infusion for MS. He states he will not stay for additional hour after tysabri for observation as he has a volunteer picking him up to take him home to [facility]. He has had tysabri in the past without any adverse effects.

## 2014-10-10 ENCOUNTER — Telehealth: Payer: Self-pay

## 2014-10-10 ENCOUNTER — Encounter: Payer: Self-pay | Admitting: Neurology

## 2014-10-10 ENCOUNTER — Ambulatory Visit (INDEPENDENT_AMBULATORY_CARE_PROVIDER_SITE_OTHER): Payer: PRIVATE HEALTH INSURANCE | Admitting: Neurology

## 2014-10-10 ENCOUNTER — Telehealth: Payer: Self-pay | Admitting: *Deleted

## 2014-10-10 VITALS — BP 131/77 | HR 88 | Ht 71.0 in | Wt 190.0 lb

## 2014-10-10 DIAGNOSIS — G35 Multiple sclerosis: Secondary | ICD-10-CM | POA: Diagnosis not present

## 2014-10-10 DIAGNOSIS — R269 Unspecified abnormalities of gait and mobility: Secondary | ICD-10-CM | POA: Diagnosis not present

## 2014-10-10 MED ORDER — SOLIFENACIN SUCCINATE 5 MG PO TABS: 5.0000 mg | ORAL_TABLET | Freq: Every day | ORAL | Status: DC

## 2014-10-10 MED ORDER — VENLAFAXINE HCL ER 37.5 MG PO CP24: 37.5000 mg | ORAL_CAPSULE | Freq: Every day | ORAL | Status: DC

## 2014-10-10 NOTE — Telephone Encounter (Signed)
Patient form on [employer].

## 2014-10-10 NOTE — Progress Notes (Signed)
PATIENT: Lawrence Lamb DOB: [birth year]  Chief Complaint  Patient presents with  . Multiple Sclerosis    He is here with his wife, Lawrence Lamb. He is concerned about his worsening gait, frequent urination, double/blurred vision and upper extremity weakness.  He was not able to afford Ampyra but has not attempted patient assistance yet.  Says he feels depressed.    HISTORICAL  History of Present Illness:  Lawrence Lamb is a 44 years old right-handed African American male, follow-up for relapsing remediating multiple sclerosis  He moved from [place] to [place] in 2013, was previously under the care of [place] [facility] specialist Dr. [name],  He was diagnosed with relapsing remitting multiple sclerosis in early 2007, presenting with right face, right leg weakness, has mild gait difficulty, diagnosis was confirmed by marked abnormal MRI of brain, and cervical lesions   He was initially treated with Betaseron for 18 months, clinically he was stable, but there was enhancing MRI lesions on Betaseron, he developed positive interferon antibodies.  Tysabri was started since February 2009, initially every 4 weeks, later stretching to every 6 weeks, JC virus antibody was negative in September 2009 and April 2010, but JC virus antibody began to be positive since September 2010, he tolerates the [name] infusion very well from Feb 2009 till Jan 2013. He has repeat MRI of brain every 6 months.    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  He was treated with [name] from January 24th, 2014 for 6 months, but he continued to have slow progressive worsening gait difficulty, fell multiple times, had few rounds of steroid.  Abnormal visual evoked response test secondary to prolongation of the P100 waveform on the left.    He now complains of  constant gait difficulty, bilateral lower extremity spasticity, also complains of urinary incontinence, double vision, he could no longer driving,  also complains of worsening memory trouble, difficulty focusing, could not take care of his young children by himself.     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, index value is 4.15 in June 2014, 2.66 in 11/22/2013   I have discussed treatment options with patient and his wife, he had progressive worsening gait difficulty, memory trouble, vision problem since he was off [name], taking [name] treatment,  His symptoms were relatively stable while taking Lawrence Lamb is 10/998 chance of PML,  He restarted on [name] infusion  since Oct 14th 2014   UPDATE Sept 2015 During followup visit, he continued to complain progressive worsening gait difficulty, spasticity of lower extremity muscles, clumsiness of bilateral hands, fatigue, memory trouble, difficulty focusing, double vision, he is no longer driving, walking with assistance, difficulty keeping his young children at home.   We have reviewed MRI of the brain with without contrast August 2015, extensive supratentorium, and posterior fossa lesions, no significant progression compared to previous scans in March 2015,  MRI cervical with without contrast also demonstrated prolonged segments of lesions involving midcervical cord, no contrast enhancement  UPDATE Dec 10th 2015: He was evaluated by [facility] Drs. Lawrence Lamb  January 10 2014, who agreed on continue Startex treatment, patient likely enter into the degenerative stages of his MS, but he continued to complain gradual decline, Lawrence Lamb still is reasonable choice, He is forgetful, waking up at evening, he complains of double vision. He has tried Ampyra 10 mg bid since 2015, did not  have significant side effect, which did help his walking  UPDATE Feb 11th 2016: His walking has much improved with Ampyra twice a day, no significant side effect, he did not had MRI  of brain, cervical spine as previously scheduled,    UPDATE October 10 2014: He was admitted to hospital March 28-30 2016 for flulike illness, he had much worsening gait difficulty ever since, now rely on his forefoot cane, he also complains of worsening nocturia, wake up multiple times during night, depressed he could not contribute much to his family, he is still waiting for social disability applications.    REVIEW OF SYSTEMS: Full 14 system review of systems performed and notable only for double vision, alert vision, constipation, daytime sleepiness, frequent urination, walking difficulty, memory loss, weakness, confusion, depression  ALLERGIES: No Known Allergies  HOME MEDICATIONS: Current Outpatient Prescriptions  Medication Sig Dispense Refill  . baclofen (LIORESAL) 10 MG tablet Take 1 tablet (10 mg total) by mouth 3 (three) times daily. 90 each 11  . Cholecalciferol (VITAMIN D PO) Take by mouth 2 (two) times daily.    Marland Kitchen dalfampridine 10 MG TB12 Take 10 mg by mouth 2 (two) times daily.    Marland Kitchen loratadine (CLARITIN) 10 MG tablet Take 10 mg by mouth daily.    . multivitamin-iron-minerals-folic acid (CENTRUM) chewable tablet Chew 1 tablet by mouth daily.    Marland Kitchen omega-3 acid ethyl esters (LOVAZA) 1 G capsule Take 1,000 mg by mouth 2 (two) times daily.    Marland Kitchen oseltamivir (TAMIFLU) 75 MG capsule Take 1 capsule (75 mg total) by mouth 2 (two) times daily. 12 capsule 0  . sodium chloride 0.9 % SOLN 100 mL with natalizumab 300 MG/15ML CONC 300 mg Inject 300 mg into the vein every 28 (twenty-eight) days. Iv infusion    . zolpidem (AMBIEN) 5 MG tablet Take 1 tablet (5 mg total) by mouth at bedtime as needed for sleep. 30 tablet 3  . solifenacin (VESICARE) 5 MG tablet Take 1 tablet (5 mg total) by mouth at bedtime. 30 tablet 11  . venlafaxine XR (EFFEXOR XR) 37.5 MG 24 hr capsule Take 1 capsule (37.5 mg total) by mouth daily with breakfast. 30 capsule 6   No current facility-administered medications for  this visit.    PAST MEDICAL HISTORY: Past Medical History  Diagnosis Date  . Multiple sclerosis 2007    PAST SURGICAL HISTORY: Past Surgical History  Procedure Laterality Date  . Appendectomy  44 years old  . Vasectomy  08/2013    FAMILY HISTORY: Family History  Problem Relation Age of Onset  . High blood pressure Mother   . High blood pressure Father     SOCIAL HISTORY:  History   Social History  . Marital Status: Married    Spouse Name: Lawrence Lamb  . Number of Children: 2  . Years of Education: 12   Occupational History  .      Disabled   Social History Main Topics  . Smoking status: Never Smoker   . Smokeless tobacco: Never Used  . Alcohol Use: No  . Drug Use: No  . Sexual Activity: Not on file   Other Topics Concern  . Not on file   Social History Narrative   Patient lives at home with his wife Lawrence Lamb). Patient has two children. Patient is disabled.   Right handed.   Caffeine- one cup daily.     PHYSICAL EXAM   Filed Vitals:   10/10/14 1330  BP: 131/77  Pulse: 88  Height: 5\' 11"  (1.803 m)  Weight: 190 lb (86.183 kg)    Not recorded      Body mass index is 26.51 kg/(m^2).  PHYSICAL EXAMNIATION:  Gen: NAD, conversant, well nourised, obese, well groomed                     Cardiovascular: Regular rate rhythm, no peripheral edema, warm, nontender. Eyes: Conjunctivae clear without exudates or hemorrhage Neck: Supple, no carotid bruise. Pulmonary: Clear to auscultation bilaterally   NEUROLOGICAL EXAM:  MENTAL STATUS: Speech:    Speech is normal; fluent and spontaneous with normal comprehension.  Cognition:    The patient is oriented to person, place, and time;     CRANIAL NERVES: CN II: Visual fields are full to confrontation. Fundoscopic exam is normal with sharp discs and no vascular changes. Pupils are 4 mm and briskly reactive to light. Visual acuity is 20/20 bilaterally. CN III, IV, VI: extraocular movement are normal. No ptosis. CN  V: Facial sensation is intact to pinprick in all 3 divisions bilaterally. Corneal responses are intact.  CN VII: Face is symmetric with normal eye closure and smile. CN VIII: Hearing is normal to rubbing fingers CN IX, X: Palate elevates symmetrically. Phonation is normal. CN XI: Head turning and shoulder shrug are intact CN XII: Tongue is midline with normal movements and no atrophy.  MOTOR: Moderate spasticity of bilateral lower extremity, right worse than right, bilateral hip flexion 4, ankle dorsiflexion4/5, mild right upper extremity fixation on rapid rotating movement  REFLEXES: Hyperreflexia on both side. Plantar responses are flexor.  SENSORY: Length dependent decreased light touch, vibratory sensation to knee level   COORDINATION: Rapid alternating movements and fine finger movements are intact. There is no dysmetria on finger-to-nose and heel-knee-shin. There are no abnormal or extraneous movements.   GAIT/STANCE: He need to push up to get up from seated position, stiff, unsteady gait, rely on his cane, dragging right foot.  DIAGNOSTIC DATA (LABS, IMAGING, TESTING) - I reviewed patient records, labs, notes, testing and imaging myself where available.  Lab Results  Component Value Date   WBC 6.0 07/19/2014   HGB 12.4* 07/19/2014   HCT 38.2* 07/19/2014   MCV 92.7 07/19/2014   PLT 213 07/19/2014      Component Value Date/Time   NA 139 07/19/2014 0456   NA 140 11/22/2013 1550   K 3.7 07/19/2014 0456   CL 106 07/19/2014 0456   CO2 28 07/19/2014 0456   GLUCOSE 98 07/19/2014 0456   GLUCOSE 77 11/22/2013 1550   BUN 9 07/19/2014 0456   BUN 10 11/22/2013 1550   CREATININE 0.92 07/19/2014 0456   CALCIUM 8.4 07/19/2014 0456   PROT 6.5 07/18/2014 0227   PROT 6.9 11/22/2013 1550   ALBUMIN 3.6 07/18/2014 0227   AST 28 07/18/2014 0227   ALT 37 07/18/2014 0227   ALKPHOS 37* 07/18/2014 0227   BILITOT 0.8 07/18/2014 0227   GFRNONAA >90 07/19/2014 0456   GFRAA >90  07/19/2014 0456   No results found for: CHOL, HDL, LDLCALC, LDLDIRECT, TRIG, CHOLHDL No results found for: HGBA1C Lab Results  Component Value Date   OEVOJJKK93 818 11/22/2013   Lab Results  Component Value Date   TSH 0.462 07/18/2014      ASSESSMENT AND PLAN  Ordean Fouts is a 44 y.o. male with history of relapsing and meeting multiple sclerosis, JC virus positive, previously Tysabri treatment ( Feb 2009 till Jan 2013),  He was treated with [name]  (  January 2014 to Oct 2014), during treatment with [name], he continues to have  progressive worsening gait difficulty, urinary incontinence.  He is 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 in July  2014, 2.66 in August 2015  I have discussed treatment options with patient and his wife, we decided to proceed with [name] infusion, despite positive JC virus antibody with high titer. There is 10/998 chance of PML,  He restarted [name] infusion since Oct 2014,   He had much worsening gait difficulty since his recent flu in March 2016, also complains of worsening confusion, fatigue, urinary urgency, incontinence  1, anti-[name] antibody 2, repeat MRI of the brain with without contrast 3. Effexor 37..5 milligrams for depression 4. Add on Vesicare for nocturia, continue Ambien for insomnia 5 also provided him with educational material on [place].  .  6. RTC in one month  Marcial Pacas, M.D. Ph.D.  [facility] [place], [place] [address] Phone: [phone] Fax:      [phone]   Orders Placed This Encounter  Procedures  . MR Brain W Wo Contrast  . Anti-Tysabri (Natalizumab) Ab    Modified Medications   No medications on file    Return in about 1 month (around 11/09/2014).  Marcial Pacas, M.D. Ph.D.  [facility] [address], [place], [place] [address] Ph: [phone] Fax: [phone]

## 2014-10-10 NOTE — Telephone Encounter (Signed)
Lawrence Lamb needs to complete paperwork for patient assistance for Ampyra.  Janett Billow is going to help him.

## 2014-10-10 NOTE — Telephone Encounter (Signed)
I called to provide the phone number to contact [employer] regarding [place], [phone].  Spoke with Ms Scearce who said she will contact them and call us back if anything further is needed.

## 2014-10-11 DIAGNOSIS — Z0289 Encounter for other administrative examinations: Secondary | ICD-10-CM

## 2014-10-13 ENCOUNTER — Telehealth: Payer: Self-pay | Admitting: *Deleted

## 2014-10-13 NOTE — Telephone Encounter (Signed)
I mailed the patient form out on 09/2414 to his home address.

## 2014-10-18 ENCOUNTER — Telehealth: Payer: Self-pay | Admitting: *Deleted

## 2014-10-18 NOTE — Telephone Encounter (Signed)
MRI Head w/wo completed on 10/12/14 at [facility] [phone], ext [phone] or [phone]).  Impression: Findings consistent with advanced Multiple Sclerosis with atrophy and extensive white matter disease as well as posterior fossa disease.  No lesions show restricted diffusion or abnormal enhancement.

## 2014-10-19 ENCOUNTER — Encounter (HOSPITAL_COMMUNITY): Payer: PRIVATE HEALTH INSURANCE

## 2014-10-20 LAB — ANTI-TYSABRI (NATALIZUMAB) AB: ANTI-TYSABRI(NATALIZUMAB) AB: NEGATIVE

## 2014-10-25 ENCOUNTER — Telehealth: Payer: Self-pay | Admitting: *Deleted

## 2014-10-25 NOTE — Telephone Encounter (Signed)
-----   Message from Marcial Pacas, MD sent at 10/24/2014 11:17 PM EDT ----- Please call patient for negative anti-tysabri antibody

## 2014-10-25 NOTE — Telephone Encounter (Signed)
Spoke to patient he is aware of results.

## 2014-10-28 ENCOUNTER — Other Ambulatory Visit: Payer: Self-pay | Admitting: Neurology

## 2014-10-28 DIAGNOSIS — G35 Multiple sclerosis: Secondary | ICD-10-CM

## 2014-11-06 ENCOUNTER — Encounter (HOSPITAL_COMMUNITY)
Admission: RE | Admit: 2014-11-06 | Discharge: 2014-11-06 | Disposition: A | Discharge status: Home / Self Care | Payer: PRIVATE HEALTH INSURANCE | Source: Ambulatory Visit | Attending: Neurology | Admitting: Neurology

## 2014-11-06 ENCOUNTER — Encounter (HOSPITAL_COMMUNITY): Payer: Self-pay

## 2014-11-06 VITALS — BP 116/68 | HR 60 | Temp 97.7°F | Resp 20 | Ht 71.0 in | Wt 185.0 lb

## 2014-11-06 DIAGNOSIS — G35 Multiple sclerosis: Secondary | ICD-10-CM | POA: Insufficient documentation

## 2014-11-06 MED ORDER — ACETAMINOPHEN 500 MG PO TABS
1000.0000 mg | ORAL_TABLET | ORAL | Status: DC
  Administered 2014-11-06: 1000 mg via ORAL
  Filled 2014-11-06: qty 2

## 2014-11-06 MED ORDER — LORATADINE 10 MG PO TABS: 10.0000 mg | ORAL_TABLET | ORAL | Status: DC

## 2014-11-06 MED ORDER — NATALIZUMAB 300 MG/15ML IV CONC
300.0000 mg | INTRAVENOUS | Status: DC
  Administered 2014-11-06: 300 mg via INTRAVENOUS
  Filled 2014-11-06: qty 15

## 2014-11-06 MED ORDER — SODIUM CHLORIDE 0.9 % IV SOLN
INTRAVENOUS | Status: DC
  Administered 2014-11-06: 10:00:00 via INTRAVENOUS

## 2014-11-06 NOTE — Discharge Instructions (Signed)

## 2014-11-06 NOTE — Progress Notes (Signed)
Uneventful infusion of TYSABRI. Pt stayed only 20 minutes of the suggested TOUCH post TYSABRI infusion observation time. Pt states he will call his neurologist for any questions or concerns

## 2014-11-16 ENCOUNTER — Encounter (HOSPITAL_COMMUNITY): Payer: PRIVATE HEALTH INSURANCE

## 2014-11-20 ENCOUNTER — Ambulatory Visit: Payer: PRIVATE HEALTH INSURANCE | Admitting: Neurology

## 2014-11-21 ENCOUNTER — Ambulatory Visit (INDEPENDENT_AMBULATORY_CARE_PROVIDER_SITE_OTHER): Payer: PRIVATE HEALTH INSURANCE | Admitting: Neurology

## 2014-11-21 ENCOUNTER — Encounter: Payer: Self-pay | Admitting: Neurology

## 2014-11-21 VITALS — BP 113/76 | HR 72 | Ht 71.0 in | Wt 186.0 lb

## 2014-11-21 DIAGNOSIS — R269 Unspecified abnormalities of gait and mobility: Secondary | ICD-10-CM

## 2014-11-21 DIAGNOSIS — G35 Multiple sclerosis: Secondary | ICD-10-CM

## 2014-11-21 DIAGNOSIS — R5382 Chronic fatigue, unspecified: Secondary | ICD-10-CM | POA: Diagnosis not present

## 2014-11-21 DIAGNOSIS — F32A Depression, unspecified: Secondary | ICD-10-CM

## 2014-11-21 DIAGNOSIS — N3941 Urge incontinence: Secondary | ICD-10-CM | POA: Diagnosis not present

## 2014-11-21 DIAGNOSIS — G4700 Insomnia, unspecified: Secondary | ICD-10-CM | POA: Diagnosis not present

## 2014-11-21 DIAGNOSIS — F329 Major depressive disorder, single episode, unspecified: Secondary | ICD-10-CM | POA: Diagnosis not present

## 2014-11-21 NOTE — Progress Notes (Signed)
Chief Complaint  Patient presents with  . Multiple Sclerosis    He is here with wife, Lawrence Lamb, to discuss his MRI results, labs and treatment options.  . Depression    Says his depression has improved.  . Nocturia    Vesicare was cost prohibitive.  . Insomnia    Says Ambien is helpful for sleep.      PATIENT: Lawrence Lamb DOB: May 15, 1970  Chief Complaint  Patient presents with  . Multiple Sclerosis    He is here with wife, Lawrence Lamb, to discuss his MRI results, labs and treatment options.  . Depression    Says his depression has improved.  . Nocturia    Vesicare was cost prohibitive.  . Insomnia    Says Ambien is helpful for sleep.    HISTORICAL  History of Present Illness:  Lawrence Lamb is a 44 years old right-handed African American male, follow-up for relapsing remediating multiple sclerosis  He moved from [place] to [place] in 2013, was previously under the care of [place] MS specialist Lawrence Lamb,  He was diagnosed with relapsing remitting multiple sclerosis in early 2007, presenting with right face, right leg weakness, has mild gait difficulty, diagnosis was confirmed by marked abnormal MRI of brain, and cervical lesions   He was initially treated with Betaseron for 18 months, clinically he was stable, but there was enhancing MRI lesions on Betaseron, he developed positive interferon antibodies.  Tysabri was started since February 2009, initially every 4 weeks, later stretching to every 6 weeks, JC virus antibody was negative in September 2009 and April 2010, but JC virus antibody began to be positive since September 2010, he tolerates the [name] infusion very well from Feb 2009 till Jan 2013. He has repeat MRI of brain every 6 months.    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  He was treated with  [name] from January 24th, 2014 for 6 months, but he continued to have slow progressive worsening gait difficulty, fell multiple times, had few rounds of steroid.  Abnormal visual evoked response test secondary to prolongation of the P100 waveform on the left.    He now complains of constant gait difficulty, bilateral lower extremity spasticity, also complains of urinary incontinence, double vision, he could no longer driving,  also complains of worsening memory trouble, difficulty focusing, could not take care of his young children by himself.     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, index value is 4.15 in June 2014, 2.66 in 11/22/2013   I have discussed treatment options with patient and his wife, he had progressive worsening gait difficulty, memory trouble, vision problem since he was off [name], taking [name] treatment,  His symptoms were relatively stable while taking Lawrence Lamb is 10/998 chance of PML,  He restarted on [name] infusion  since Oct 14th 2014   UPDATE Sept 2015 During followup visit, he continued to complain progressive worsening gait difficulty, spasticity of lower extremity muscles, clumsiness of bilateral hands, fatigue, memory trouble, difficulty focusing, double vision, he is no longer driving, walking with assistance, difficulty keeping his young children at home.   We have reviewed MRI of the brain with without contrast August 2015, extensive supratentorium, and posterior fossa lesions, no significant progression compared to previous scans in March 2015,  MRI cervical with without contrast also demonstrated prolonged segments of lesions involving midcervical cord, no  contrast enhancement  UPDATE Dec 10th 2015: He was evaluated by [facility] Lawrence Lamb  January 10 2014, who agreed on continue Lawrence Lamb treatment, patient likely enter into the degenerative stages of his MS, but he continued to complain gradual decline, Lawrence Lamb still is reasonable choice, He is  forgetful, waking up at evening, he complains of double vision. He has tried Ampyra 10 mg bid since 2015, did not have significant side effect, which did help his walking  UPDATE Feb 11th 2016: His walking has much improved with Ampyra twice a day, no significant side effect, he did not had MRI of brain, cervical spine as previously scheduled,    UPDATE October 10 2014: He was admitted to hospital March 28-30 2016 for flulike illness, he had much worsening gait difficulty ever since, now rely on his forefoot cane, he also complains of worsening nocturia, wake up multiple times during night, depressed he could not contribute much to his family, he is still waiting for social disability applications.   Update August the second 2016: He continue complains of gait difficulty, right worse than left, rely on his cane, Effexor has been helpful for his depression, complains of worsening memory trouble Anti-[name] antibody was negative. He and his wife concerned about the side effect of lemtrada, wants to stay on Tysabri.  I have reviewed and compared MRI scan in June 2016 at [facility] to February 2016 MRIs, multiple supratentorium lesions, T1 black holes, no contrast enhancement, no significant change compared to previous scans,   REVIEW OF SYSTEMS: Full 14 system review of systems performed and notable only for double vision, alert vision, constipation, daytime sleepiness, frequent urination, walking difficulty, memory loss, weakness, confusion, depression  ALLERGIES: No Known Allergies  HOME MEDICATIONS: Current Outpatient Prescriptions  Medication Sig Dispense Refill  . baclofen (LIORESAL) 10 MG tablet Take 1 tablet (10 mg total) by mouth 3 (three) times daily. 90 each 11  . Cholecalciferol (VITAMIN D PO) Take by mouth 2 (two) times daily.    Marland Kitchen dalfampridine 10 MG TB12 Take 10 mg by mouth 2 (two) times daily.    Marland Kitchen loratadine (CLARITIN) 10 MG tablet Take 10 mg by mouth daily.    .  multivitamin-iron-minerals-folic acid (CENTRUM) chewable tablet Chew 1 tablet by mouth daily.    Marland Kitchen omega-3 acid ethyl esters (LOVAZA) 1 G capsule Take 1,000 mg by mouth 2 (two) times daily.    . sodium chloride 0.9 % SOLN 100 mL with natalizumab 300 MG/15ML CONC 300 mg Inject 300 mg into the vein every 28 (twenty-eight) days. Iv infusion    . venlafaxine XR (EFFEXOR XR) 37.5 MG 24 hr capsule Take 1 capsule (37.5 mg total) by mouth daily with breakfast. 30 capsule 6  . zolpidem (AMBIEN) 5 MG tablet Take 1 tablet (5 mg total) by mouth at bedtime as needed for sleep. 30 tablet 3   No current facility-administered medications for this visit.    PAST MEDICAL HISTORY: Past Medical History  Diagnosis Date  . Multiple sclerosis 2007    PAST SURGICAL HISTORY: Past Surgical History  Procedure Laterality Date  . Appendectomy  44 years old  . Vasectomy  08/2013    FAMILY HISTORY: Family History  Problem Relation Age of Onset  . High blood pressure Mother   . High blood pressure Father     SOCIAL HISTORY:  History   Social History  . Marital Status: Married    Spouse Name: Eida  . Number of Children: 2  .  Years of Education: 12   Occupational History  .      Disabled   Social History Main Topics  . Smoking status: Never Smoker   . Smokeless tobacco: Never Used  . Alcohol Use: No  . Drug Use: No  . Sexual Activity: Not on file   Other Topics Concern  . Not on file   Social History Narrative   Patient lives at home with his wife Lawrence Lamb). Patient has two children. Patient is disabled.   Right handed.   Caffeine- one cup daily.     PHYSICAL EXAM   Filed Vitals:   11/21/14 1511  BP: 113/76  Pulse: 72  Height: 5\' 11"  (1.803 m)  Weight: 186 lb (84.369 kg)    Not recorded      Body mass index is 25.95 kg/(m^2).  PHYSICAL EXAMNIATION:  Gen: NAD, conversant, well nourised, obese, well groomed                     Cardiovascular: Regular rate rhythm, no peripheral  edema, warm, nontender. Eyes: Conjunctivae clear without exudates or hemorrhage Neck: Supple, no carotid bruise. Pulmonary: Clear to auscultation bilaterally   NEUROLOGICAL EXAM:  MENTAL STATUS: Speech:    Speech is normal; fluent and spontaneous with normal comprehension.  Cognition:    The patient is oriented to person, place, and time;     CRANIAL NERVES: CN II: Visual fields are full to confrontation, pupils were equal round reactive to light CN III, IV, VI: extraocular movement are normal. End gaze horizontal nystagmus, No ptosis. CN V: Facial sensation is intact to pinprick in all 3 divisions bilaterally. Corneal responses are intact.  CN VII: Face is symmetric with normal eye closure and smile. CN VIII: Hearing is normal to rubbing fingers CN IX, X: Palate elevates symmetrically. Phonation is normal. CN XI: Head turning and shoulder shrug are intact CN XII: Tongue is midline with normal movements and no atrophy.  MOTOR: Moderate spasticity of bilateral lower extremity, right worse than right, bilateral hip flexion 4, ankle dorsiflexion4/5, mild right upper extremity fixation on rapid rotating movement  REFLEXES: Hyperreflexia on both side. Plantar responses are flexor.  SENSORY: Length dependent decreased light touch, vibratory sensation to knee level   COORDINATION: Rapid alternating movements and fine finger movements are intact. There is no dysmetria on finger-to-nose and heel-knee-shin.    GAIT/STANCE: He need to push up to get up from seated position, stiff, unsteady gait, rely on his cane, dragging right leg  DIAGNOSTIC DATA (LABS, IMAGING, TESTING) - I reviewed patient records, labs, notes, testing and imaging myself where available.  Jc virus positive, index 4.15 in July 2014, 2.66 in August 2015. Anti-Tysabri antibody: negative Feb 2016.  MRI brain w/wo in June 2016 at [facility] ([place]): Stable multiple supratentorium MS lesions, no contrast  enhancement, no significant change compared to previous scan in February 2016.  Treatment:   Lawrence Lamb February 2009 till January 2013,  [name], Jan 2014 to Oct 2014.  Restart Tysabri since Oct 2014.    ASSESSMENT AND PLAN  Amadeo Coke is a 44 y.o. male   Relapsing and meeting multiple sclerosis,  I have offered treatment option of lemtrada, he and his wife are concerned about side effect, want to stay on [name]. Urinary incontinence: Insomnia; Ambien as needed Gait difficulty Memory loss Depression: Keep Effexor XR 37.5 daily    Marcial Pacas, M.D. Ph.D.  [facility] [place], [place] [address] Phone: [phone] Fax:  [phone]      Marcial Pacas, M.D. Ph.D.  [facility] [address] [place], [place] [address] Ph: [phone] Fax: [phone]

## 2014-12-04 ENCOUNTER — Encounter (HOSPITAL_COMMUNITY)
Admission: RE | Admit: 2014-12-04 | Payer: PRIVATE HEALTH INSURANCE | Source: Ambulatory Visit | Attending: Neurology | Admitting: Neurology

## 2014-12-07 ENCOUNTER — Telehealth: Payer: Self-pay | Admitting: Neurology

## 2014-12-07 NOTE — Telephone Encounter (Signed)
Horris Latino from [employer] needs a pre certification for Fritzi Mandes, code: [phone]. If they do not get this pt will have appt cancelled. Please call Horris Latino [phone].

## 2014-12-07 NOTE — Telephone Encounter (Signed)
Patient's appointment is 12/12/14 - will submit for PA prior to this date.

## 2014-12-08 NOTE — Telephone Encounter (Signed)
[name] called to check status of Prior Auth for Tysabri [phone]

## 2014-12-11 NOTE — Telephone Encounter (Signed)
PA pending - not approved yet.  I called [name] at [facility] and left message letting her know the status.  Called patient to let him know his appt is going to be rescheduled.

## 2014-12-12 ENCOUNTER — Encounter (HOSPITAL_COMMUNITY): Admission: RE | Admit: 2014-12-12 | Payer: PRIVATE HEALTH INSURANCE | Source: Ambulatory Visit

## 2014-12-13 ENCOUNTER — Other Ambulatory Visit: Payer: Self-pay | Admitting: *Deleted

## 2014-12-13 NOTE — Telephone Encounter (Signed)
Tysabri approved by [facility] - authorization [id number] - approved from 12/12/14 to 12/12/15. ([employer] contact:  Donette Larry [phone], fax: [phone])  Lawrence Lamb at [facility] and patient aware of approval.  He will be rescheduled.

## 2014-12-14 ENCOUNTER — Encounter (HOSPITAL_COMMUNITY)
Admission: RE | Admit: 2014-12-14 | Discharge: 2014-12-14 | Disposition: A | Discharge status: Home / Self Care | Payer: PRIVATE HEALTH INSURANCE | Source: Ambulatory Visit | Attending: Neurology | Admitting: Neurology

## 2014-12-14 ENCOUNTER — Encounter (HOSPITAL_COMMUNITY): Payer: Self-pay

## 2014-12-14 ENCOUNTER — Encounter (HOSPITAL_COMMUNITY): Payer: PRIVATE HEALTH INSURANCE

## 2014-12-14 VITALS — BP 114/74 | HR 67 | Temp 98.4°F | Resp 16

## 2014-12-14 DIAGNOSIS — G35 Multiple sclerosis: Secondary | ICD-10-CM | POA: Insufficient documentation

## 2014-12-14 MED ORDER — ACETAMINOPHEN 500 MG PO TABS
1000.0000 mg | ORAL_TABLET | ORAL | Status: DC
  Administered 2014-12-14: 1000 mg via ORAL
  Filled 2014-12-14: qty 2

## 2014-12-14 MED ORDER — LORATADINE 10 MG PO TABS
10.0000 mg | ORAL_TABLET | ORAL | Status: DC
  Administered 2014-12-14: 10 mg via ORAL
  Filled 2014-12-14: qty 1

## 2014-12-14 MED ORDER — SODIUM CHLORIDE 0.9 % IV SOLN
300.0000 mg | INTRAVENOUS | Status: DC
  Administered 2014-12-14: 300 mg via INTRAVENOUS
  Filled 2014-12-14: qty 15

## 2014-12-14 MED ORDER — SODIUM CHLORIDE 0.9 % IV SOLN
INTRAVENOUS | Status: DC
  Administered 2014-12-14: 12:00:00 via INTRAVENOUS

## 2014-12-14 NOTE — Discharge Instructions (Signed)

## 2015-01-01 ENCOUNTER — Encounter (HOSPITAL_COMMUNITY): Payer: PRIVATE HEALTH INSURANCE

## 2015-01-09 ENCOUNTER — Encounter (HOSPITAL_COMMUNITY): Payer: PRIVATE HEALTH INSURANCE

## 2015-01-11 ENCOUNTER — Encounter (HOSPITAL_COMMUNITY): Payer: PRIVATE HEALTH INSURANCE

## 2015-01-15 ENCOUNTER — Encounter (HOSPITAL_COMMUNITY)
Admission: RE | Admit: 2015-01-15 | Discharge: 2015-01-15 | Disposition: A | Discharge status: Home / Self Care | Payer: PRIVATE HEALTH INSURANCE | Source: Ambulatory Visit | Attending: Neurology | Admitting: Neurology

## 2015-01-15 ENCOUNTER — Encounter (HOSPITAL_COMMUNITY): Payer: Self-pay

## 2015-01-15 VITALS — BP 123/77 | HR 58 | Temp 98.8°F | Resp 16 | Ht 71.0 in

## 2015-01-15 DIAGNOSIS — G35 Multiple sclerosis: Secondary | ICD-10-CM | POA: Diagnosis present

## 2015-01-15 MED ORDER — SODIUM CHLORIDE 0.9 % IV SOLN
300.0000 mg | INTRAVENOUS | Status: AC
  Administered 2015-01-15: 300 mg via INTRAVENOUS
  Filled 2015-01-15: qty 15

## 2015-01-15 MED ORDER — LORATADINE 10 MG PO TABS
10.0000 mg | ORAL_TABLET | ORAL | Status: AC
  Administered 2015-01-15: 10 mg via ORAL
  Filled 2015-01-15: qty 1

## 2015-01-15 MED ORDER — SODIUM CHLORIDE 0.9 % IV SOLN
INTRAVENOUS | Status: AC
  Administered 2015-01-15: 11:00:00 via INTRAVENOUS

## 2015-01-15 MED ORDER — ACETAMINOPHEN 500 MG PO TABS
1000.0000 mg | ORAL_TABLET | ORAL | Status: AC
  Administered 2015-01-15: 1000 mg via ORAL
  Filled 2015-01-15: qty 2

## 2015-01-15 NOTE — Progress Notes (Addendum)
Patient arrives for his tysabri infusion.  He is not authorized at this site to receive infusion. Called Otila Kluver at [facility] and he was to be infused there. Otila Kluver agreed to allow infusion [facility] site today and then will infuse him at [facility] site in future.  1207  Patient informs RN that he will not stay the second hour for observation as he has to catch a ride back to [facility] with a volunteer who brings him.

## 2015-02-02 ENCOUNTER — Other Ambulatory Visit: Payer: Self-pay | Admitting: Neurology

## 2015-02-12 ENCOUNTER — Encounter (HOSPITAL_COMMUNITY): Payer: PRIVATE HEALTH INSURANCE

## 2015-02-21 ENCOUNTER — Ambulatory Visit (INDEPENDENT_AMBULATORY_CARE_PROVIDER_SITE_OTHER): Payer: PRIVATE HEALTH INSURANCE | Admitting: Neurology

## 2015-02-21 ENCOUNTER — Encounter: Payer: Self-pay | Admitting: Neurology

## 2015-02-21 VITALS — BP 102/60 | HR 80

## 2015-02-21 DIAGNOSIS — G35 Multiple sclerosis: Secondary | ICD-10-CM | POA: Diagnosis not present

## 2015-02-21 DIAGNOSIS — R269 Unspecified abnormalities of gait and mobility: Secondary | ICD-10-CM

## 2015-02-21 MED ORDER — METHYLPREDNISOLONE 4 MG PO TBPK: ORAL_TABLET | ORAL | Status: DC

## 2015-02-21 NOTE — Progress Notes (Signed)
Chief Complaint  Patient presents with  . Multiple Sclerosis    He is here with his wife, [name]. Reports leg pain and weakness that started yesterday.  Symptoms have worsened and he is having increased difficulty ambulating that caused him to fall this morning.     Chief Complaint  Patient presents with  . Multiple Sclerosis    He is here with his wife, [name]. Reports leg pain and weakness that started yesterday.  Symptoms have worsened and he is having increased difficulty ambulating that caused him to fall this morning.        PATIENT: Lawrence Lamb DOB: March 07, 1971  Chief Complaint  Patient presents with  . Multiple Sclerosis    He is here with his wife, [name]. Reports leg pain and weakness that started yesterday.  Symptoms have worsened and he is having increased difficulty ambulating that caused him to fall this morning.      HISTORICAL  History of Present Illness:  Mr. Vanscyoc is a 44 years old right-handed African American male, follow-up for relapsing remediating multiple sclerosis  He moved from [place] to [place] in 2013, was previously under the care of [facility] MS specialist Dr. [name],  He was diagnosed with relapsing remitting multiple sclerosis in early 2007, presenting with right face, right leg weakness, has mild gait difficulty, diagnosis was confirmed by marked abnormal MRI of brain, and cervical lesions   He was initially treated with Betaseron for 18 months, clinically he was stable, but there was enhancing MRI lesions on Betaseron, he developed positive interferon antibodies.  Tysabri was started since February 2009, initially every 4 weeks, later stretching to every 6 weeks, JC virus antibody was negative in September 2009 and April 2010, but JC virus antibody began to be positive since September 2010, he tolerates the [name] infusion very well from Feb 2009 till Jan 2013. He has repeat MRI of brain every 6 months.    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  He was treated with [name] from January 24th, 2014 for 6 months, but he continued to have slow progressive worsening gait difficulty, fell multiple times, had few rounds of steroid.  Abnormal visual evoked response test secondary to prolongation of the P100 waveform on the left.    He now complains of constant gait difficulty, bilateral lower extremity spasticity, also complains of urinary incontinence, double vision, he could no longer driving,  also complains of worsening memory trouble, difficulty focusing, could not take care of his young children by himself.     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, index value is 4.15 in June 2014, 2.66 in 11/22/2013   I have discussed treatment options with patient and his wife, he had progressive worsening gait difficulty, memory trouble, vision problem since he was off [name], taking [name] treatment,  His symptoms were relatively stable while taking [name] is 10/998 chance of PML,  He restarted on [name] infusion  since Oct 14th 2014   UPDATE Sept 2015 During followup visit, he continued to complain progressive worsening gait difficulty, spasticity of lower extremity muscles, clumsiness of bilateral hands, fatigue, memory trouble, difficulty focusing, double vision, he is no longer driving, walking with assistance, difficulty keeping his young children at home.   We have reviewed MRI of the brain with without contrast August 2015, extensive supratentorium, and posterior fossa lesions, no significant  progression compared to previous scans in March 2015,  MRI cervical with without contrast also demonstrated prolonged segments of lesions involving midcervical cord, no contrast enhancement  UPDATE Dec 10th 2015: He was evaluated by [facility] Drs. Zeid  January 10 2014, who  agreed on continue [place] treatment, patient likely enter into the degenerative stages of his MS, but he continued to complain gradual decline, [name] still is reasonable choice, He is forgetful, waking up at evening, he complains of double vision. He has tried Ampyra 10 mg bid since 2015, did not have significant side effect, which did help his walking  UPDATE Feb 11th 2016: His walking has much improved with Ampyra twice a day, no significant side effect, he did not had MRI of brain, cervical spine as previously scheduled,    UPDATE October 10 2014: He was admitted to hospital March 28-30 2016 for flulike illness, he had much worsening gait difficulty ever since, now rely on his forefoot cane, he also complains of worsening nocturia, wake up multiple times during night, depressed he could not contribute much to his family, he is still waiting for social disability applications.   Update August the second 2016: He continue complains of gait difficulty, right worse than left, rely on his cane, Effexor has been helpful for his depression, complains of worsening memory trouble Anti-[name] antibody was negative. He and his wife concerned about the side effect of lemtrada, wants to stay on Tysabri.  I have reviewed and compared MRI scan in June 2016 at [facility] to February 2016 MRIs, multiple supratentorium lesions, T1 black holes, no contrast enhancement, no significant change compared to previous scans,  UPDATE Feb 21 2015: He started to feel increaesd weakness since Nov 1st, right leg worse than left, he took a shower, his leg gave out, fell down, make hisself out of tbu, when he finally got out tobe, walk out of the room, he could nto bear weight,  Arm are strongle,  He has chronic constipation.      REVIEW OF SYSTEMS: Full 14 system review of systems performed and notable only for double vision, alert vision, constipation, daytime sleepiness, frequent urination, walking difficulty,  memory loss, weakness, confusion, depression  ALLERGIES: No Known Allergies  HOME MEDICATIONS: Current Outpatient Prescriptions  Medication Sig Dispense Refill  . baclofen (LIORESAL) 10 MG tablet TAKE ONE TABLET BY MOUTH THREE TIMES DAILY 90 tablet 11  . Cholecalciferol (VITAMIN D PO) Take by mouth 2 (two) times daily.    Marland Kitchen dalfampridine 10 MG TB12 Take 10 mg by mouth 2 (two) times daily.    Marland Kitchen loratadine (CLARITIN) 10 MG tablet Take 10 mg by mouth daily.    . multivitamin-iron-minerals-folic acid (CENTRUM) chewable tablet Chew 1 tablet by mouth daily.    Marland Kitchen omega-3 acid ethyl esters (LOVAZA) 1 G capsule Take 1,000 mg by mouth 2 (two) times daily.    . sodium chloride 0.9 % SOLN 100 mL with natalizumab 300 MG/15ML CONC 300 mg Inject 300 mg into the vein every 28 (twenty-eight) days. Iv infusion    . venlafaxine XR (EFFEXOR XR) 37.5 MG 24 hr capsule Take 1 capsule (37.5 mg total) by mouth daily with breakfast. 30 capsule 6  . zolpidem (AMBIEN) 5 MG tablet Take 1 tablet (5 mg total) by mouth at bedtime as needed for sleep. 30 tablet 3   No current facility-administered medications for this visit.    PAST MEDICAL HISTORY: Past Medical History  Diagnosis Date  . Multiple sclerosis ([place])  2007    PAST SURGICAL HISTORY: Past Surgical History  Procedure Laterality Date  . Appendectomy  44 years old  . Vasectomy  08/2013    FAMILY HISTORY: Family History  Problem Relation Age of Onset  . High blood pressure Mother   . High blood pressure Father     SOCIAL HISTORY:  Social History   Social History  . Marital Status: Married    Spouse Name: Eida  . Number of Children: 2  . Years of Education: 12   Occupational History  .      Disabled   Social History Main Topics  . Smoking status: Never Smoker   . Smokeless tobacco: Never Used  . Alcohol Use: No  . Drug Use: No  . Sexual Activity: Not on file   Other Topics Concern  . Not on file   Social History Narrative    Patient lives at home with his wife [name]). Patient has two children. Patient is disabled.   Right handed.   Caffeine- one cup daily.     PHYSICAL EXAM   Filed Vitals:   02/21/15 1616  BP: 102/60  Pulse: 80    Not recorded      There is no weight on file to calculate BMI.  PHYSICAL EXAMNIATION:  Gen: NAD, conversant, well nourised, obese, well groomed                     Cardiovascular: Regular rate rhythm, no peripheral edema, warm, nontender. Eyes: Conjunctivae clear without exudates or hemorrhage Neck: Supple, no carotid bruise. Pulmonary: Clear to auscultation bilaterally   NEUROLOGICAL EXAM:  MENTAL STATUS: Speech:    Speech is normal; fluent and spontaneous with normal comprehension.  Cognition:    The patient is oriented to person, place, and time;     CRANIAL NERVES: CN II: Visual fields are full to confrontation, pupils were equal round reactive to light CN III, IV, VI: extraocular movement are normal. End gaze horizontal nystagmus, No ptosis. CN V: Facial sensation is intact to pinprick in all 3 divisions bilaterally. Corneal responses are intact.  CN VII: Face is symmetric with normal eye closure and smile. CN VIII: Hearing is normal to rubbing fingers CN IX, X: Palate elevates symmetrically. Phonation is normal. CN XI: Head turning and shoulder shrug are intact CN XII: Tongue is midline with normal movements and no atrophy.  MOTOR: Moderate spasticity of bilateral lower extremity, right worse than right, bilateral hip flexion 4, ankle dorsiflexion4/5, mild right upper extremity fixation on rapid rotating movement  REFLEXES: Hyperreflexia on both side. Plantar responses are flexor.  SENSORY: Length dependent decreased light touch, vibratory sensation to knee level   COORDINATION: Mild bilateral finger-to-nose dysmetria, mild to moderate bilateral heel-to-shin dysmetria  GAIT/STANCE: He need to push up to get up from seated position, wide-based,  cautious, unsteady gait, needs assistance  DIAGNOSTIC DATA (LABS, IMAGING, TESTING) - I reviewed patient records, labs, notes, testing and imaging myself where available.  Jc virus positive, index 4.15 in July 2014, 2.66 in August 2015. Anti-Tysabri antibody: negative Feb 2016.  MRI brain w/wo in June 2016 at [facility] (canopy): Stable multiple supratentorium MS lesions, no contrast enhancement, no significant change compared to previous scan in February 2016.  Treatment:   [name] February 2009 till January 2013,  [name], Jan 2014 to Oct 2014.  Restart Tysabri since Oct 2014.    ASSESSMENT AND PLAN  Espiridion Supinski is a 44 y.o. male   Relapsing and  meeting multiple sclerosis,  I have offered treatment option of lemtrada, he and his wife are concerned about side effect, want to stay on [name] (start intrafusion since Feb 10 2015) Urinary incontinence: Insomnia; Ambien as needed Gait difficulty  Acute worsening gait difficulty since Feb 20 2015.  UA to rule out urinary tract infection  Start Metro pack.  Home physical therapy  Prescription for walker, water aerobic. Memory loss Depression: Keep Effexor XR 37.5 daily    Marcial Pacas, M.D. Ph.D.  [facility] [place], [place] [address] Phone: [phone] Fax:      [phone]      Marcial Pacas, M.D. Ph.D.  [facility] [address], [place], [place] [address] Ph: [phone] Fax: [phone]

## 2015-02-22 ENCOUNTER — Telehealth: Payer: Self-pay | Admitting: Neurology

## 2015-02-22 LAB — URINALYSIS, ROUTINE W REFLEX MICROSCOPIC
Bilirubin, UA: NEGATIVE
Glucose, UA: NEGATIVE
Ketones, UA: NEGATIVE
Leukocytes, UA: NEGATIVE
Nitrite, UA: NEGATIVE
Protein, UA: NEGATIVE
RBC, UA: NEGATIVE
Specific Gravity, UA: 1.024 (ref 1.005–1.030)
Urobilinogen, Ur: 1 mg/dL (ref 0.2–1.0)
pH, UA: 6 (ref 5.0–7.5)

## 2015-02-22 LAB — SPECIMEN STATUS REPORT

## 2015-02-22 NOTE — Telephone Encounter (Signed)
Spoke to [place] - aware of results and will start the steroid rx today.

## 2015-02-22 NOTE — Telephone Encounter (Signed)
Lawrence Lamb, please call patient, UA showed no evidence of UTI, he should proceed with Metro pack as prescribed

## 2015-03-01 ENCOUNTER — Telehealth: Payer: Self-pay | Admitting: Neurology

## 2015-03-01 NOTE — Telephone Encounter (Signed)
Lawrence Lamb with [facility] is calling to advise that a referral was received for this patient but they do not participate with his insurance.

## 2015-03-05 ENCOUNTER — Telehealth: Payer: Self-pay | Admitting: Neurology

## 2015-03-05 NOTE — Telephone Encounter (Signed)
Lawrence Lamb with [facility] called sts he spoke with pt regarding in home PT. He told pt he had $35 co-pay for each visit and pt then declined the service. He can be reached [phone]

## 2015-03-12 ENCOUNTER — Ambulatory Visit: Payer: PRIVATE HEALTH INSURANCE | Admitting: Neurology

## 2015-03-12 ENCOUNTER — Encounter (HOSPITAL_COMMUNITY): Payer: PRIVATE HEALTH INSURANCE

## 2015-03-13 DIAGNOSIS — Z0289 Encounter for other administrative examinations: Secondary | ICD-10-CM

## 2015-04-09 ENCOUNTER — Encounter (HOSPITAL_COMMUNITY): Payer: PRIVATE HEALTH INSURANCE

## 2015-05-09 ENCOUNTER — Encounter: Payer: Self-pay | Admitting: Neurology

## 2015-05-09 ENCOUNTER — Ambulatory Visit (INDEPENDENT_AMBULATORY_CARE_PROVIDER_SITE_OTHER): Payer: PRIVATE HEALTH INSURANCE | Admitting: Neurology

## 2015-05-09 VITALS — BP 127/84 | HR 81 | Ht 71.0 in | Wt 199.0 lb

## 2015-05-09 DIAGNOSIS — G35 Multiple sclerosis: Secondary | ICD-10-CM | POA: Diagnosis not present

## 2015-05-09 DIAGNOSIS — R269 Unspecified abnormalities of gait and mobility: Secondary | ICD-10-CM

## 2015-05-09 DIAGNOSIS — R531 Weakness: Secondary | ICD-10-CM

## 2015-05-09 MED ORDER — DULOXETINE HCL 60 MG PO CPEP: 60.0000 mg | ORAL_CAPSULE | Freq: Every day | ORAL | Status: DC

## 2015-05-09 MED ORDER — DOCUSATE SODIUM 100 MG PO CAPS: 100.0000 mg | ORAL_CAPSULE | Freq: Two times a day (BID) | ORAL | Status: DC

## 2015-05-09 NOTE — Progress Notes (Signed)
Chief Complaint  Patient presents with  . Multiple Sclerosis    He is here with his wife, [name].  He continues to have a slow, unsteady gait. The steroids were helpful that he took in November 2016.  He was unable to afford physical therapy.  He is also reporting two recent episodes of hallucinations.  The first time he saw three people walking in his yard but they were not there.  The next time, he thought someone was knocking at his door but no one was there.  . Memory Loss    MMSE 26/30 - 9 animals.  He is concerned about his worsening memory.      PATIENT: Lawrence Lamb DOB: [birth year]  Chief Complaint  Patient presents with  . Multiple Sclerosis    He is here with his wife, [name].  He continues to have a slow, unsteady gait. The steroids were helpful that he took in November 2016.  He was unable to afford physical therapy.  He is also reporting two recent episodes of hallucinations.  The first time he saw three people walking in his yard but they were not there.  The next time, he thought someone was knocking at his door but no one was there.  . Memory Loss    MMSE 26/30 - 9 animals.  He is concerned about his worsening memory.    HISTORICAL  History of Present Illness:  [name] is a 45 years old right-handed African American male, follow-up for relapsing remediating multiple sclerosis  He moved from [place] to [place] in 2013, was previously under the care of [facility] specialist Dr. [name],  He was diagnosed with relapsing remitting multiple sclerosis in early 2007, presenting with right face, right leg weakness, has mild gait difficulty, diagnosis was confirmed by marked abnormal MRI of brain, and cervical lesions   He was initially treated with Betaseron for 18 months, clinically he was stable, but there was enhancing MRI lesions on Betaseron, he developed positive interferon antibodies.  Tysabri was started since February 2009, initially every 4 weeks,  later stretching to every 6 weeks, JC virus antibody was negative in September 2009 and April 2010, but JC virus antibody began to be positive since September 2010, he tolerates the [name] infusion very well from Feb 2009 till Jan 2013. He has repeat MRI of brain every 6 months.    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  He was treated with [name] from January 24th, 2014 for 6 months, but he continued to have slow progressive worsening gait difficulty, fell multiple times, had few rounds of steroid.  Abnormal visual evoked response test secondary to prolongation of the P100 waveform on the left.    He now complains of constant gait difficulty, bilateral lower extremity spasticity, also complains of urinary incontinence, double vision, he could no longer driving,  also complains of worsening memory trouble, difficulty focusing, could not take care of his young children by himself.     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, index value is 4.15 in June 2014, 2.66 in 11/22/2013   I have discussed treatment options with patient and his wife, he had progressive worsening gait difficulty, memory trouble, vision problem since he was off [name], taking [name] treatment,  His symptoms were relatively stable while taking [name] is 10/998 chance of PML,  He restarted on  [name] infusion  since Oct 14th 2014   UPDATE Sept 2015 During followup visit, he continued to complain progressive worsening gait difficulty, spasticity of lower extremity muscles, clumsiness of bilateral hands, fatigue, memory trouble, difficulty focusing, double vision, he is no longer driving, walking with assistance, difficulty keeping his young children at home.   We have reviewed MRI of the brain with without contrast August 2015, extensive supratentorium, and posterior fossa  lesions, no significant progression compared to previous scans in March 2015,  MRI cervical with without contrast also demonstrated prolonged segments of lesions involving midcervical cord, no contrast enhancement  UPDATE Dec 10th 2015: He was evaluated by [facility] Drs. Zeid  January 10 2014, who agreed on continue [place] treatment, patient likely enter into the degenerative stages of his MS, but he continued to complain gradual decline, [name] still is reasonable choice, He is forgetful, waking up at evening, he complains of double vision. He has tried Ampyra 10 mg bid since 2015, did not have significant side effect, which did help his walking  UPDATE Feb 11th 2016: His walking has much improved with Ampyra twice a day, no significant side effect, he did not had MRI of brain, cervical spine as previously scheduled,    UPDATE October 10 2014: He was admitted to hospital March 28-30 2016 for flulike illness, he had much worsening gait difficulty ever since, now rely on his forefoot cane, he also complains of worsening nocturia, wake up multiple times during night, depressed he could not contribute much to his family, he is still waiting for social disability applications.   Update August the second 2016: He continue complains of gait difficulty, right worse than left, rely on his cane, Effexor has been helpful for his depression, complains of worsening memory trouble Anti-[name] antibody was negative. He and his wife concerned about the side effect of lemtrada, wants to stay on Tysabri.  I have reviewed and compared MRI scan in June 2016 [facility] to February 2016 MRIs, multiple supratentorium lesions, T1 black holes, no contrast enhancement, no significant change compared to previous scans,  UPDATE Feb 21 2015: He started to feel increaesd weakness since Nov 1st, right leg worse than left, he took a shower, his leg gave out, fell down, difficult to get out of bathtub, he  also has chronic constipations   UPDATE May 09 2015: He was treated with one course of Medrol pack, his walking has improved some, but overall has slow progress in gait difficulty, worsening memory trouble, he can no longer babysit his young children at home, mild depression, Effexor has made his depression worse, worsening constipations, failed to improve by over-the-counter medications   REVIEW OF SYSTEMS: Full 14 system review of systems performed and notable only for double vision, frequent awakening, walking difficulty, memory loss, depression hallucinations.  ALLERGIES: No Known Allergies  HOME MEDICATIONS: Current Outpatient Prescriptions  Medication Sig Dispense Refill  . baclofen (LIORESAL) 10 MG tablet TAKE ONE TABLET BY MOUTH THREE TIMES DAILY 90 tablet 11  . Cholecalciferol (VITAMIN D PO) Take by mouth 2 (two) times daily.    Marland Kitchen dalfampridine 10 MG TB12 Take 10 mg by mouth 2 (two) times daily.    Marland Kitchen loratadine (CLARITIN) 10 MG tablet Take 10 mg by mouth daily.    . methylPREDNISolone (MEDROL DOSEPAK) 4 MG TBPK tablet Take as instructed. 21 tablet 0  . multivitamin-iron-minerals-folic acid (CENTRUM) chewable tablet Chew 1 tablet by mouth daily.    Marland Kitchen omega-3 acid ethyl esters (  LOVAZA) 1 G capsule Take 1,000 mg by mouth 2 (two) times daily.    . sodium chloride 0.9 % SOLN 100 mL with natalizumab 300 MG/15ML CONC 300 mg Inject 300 mg into the vein every 28 (twenty-eight) days. Iv infusion    . venlafaxine XR (EFFEXOR XR) 37.5 MG 24 hr capsule Take 1 capsule (37.5 mg total) by mouth daily with breakfast. 30 capsule 6  . zolpidem (AMBIEN) 5 MG tablet Take 1 tablet (5 mg total) by mouth at bedtime as needed for sleep. 30 tablet 3   No current facility-administered medications for this visit.    PAST MEDICAL HISTORY: Past Medical History  Diagnosis Date  . Multiple sclerosis ([place]) 2007    PAST SURGICAL HISTORY: Past Surgical History  Procedure Laterality Date  . Appendectomy   45 years old  . Vasectomy  08/2013    FAMILY HISTORY: Family History  Problem Relation Age of Onset  . High blood pressure Mother   . High blood pressure Father     SOCIAL HISTORY:  Social History   Social History  . Marital Status: Married    Spouse Name: Eida  . Number of Children: 2  . Years of Education: 12   Occupational History  .      Disabled   Social History Main Topics  . Smoking status: Never Smoker   . Smokeless tobacco: Never Used  . Alcohol Use: No  . Drug Use: No  . Sexual Activity: Not on file   Other Topics Concern  . Not on file   Social History Narrative   Patient lives at home with his wife [name]). Patient has two children. Patient is disabled.   Right handed.   Caffeine- one cup daily.     PHYSICAL EXAM   Filed Vitals:   05/09/15 1556  BP: 127/84  Pulse: 81  Height: 5\' 11"  (1.803 m)  Weight: 199 lb (90.266 kg)    Not recorded      Body mass index is 27.77 kg/(m^2).  PHYSICAL EXAMNIATION:  Gen: NAD, conversant, well nourised, obese, well groomed                     Cardiovascular: Regular rate rhythm, no peripheral edema, warm, nontender. Eyes: Conjunctivae clear without exudates or hemorrhage Neck: Supple, no carotid bruise. Pulmonary: Clear to auscultation bilaterally   NEUROLOGICAL EXAM:  MENTAL STATUS: Speech:    Speech is normal; fluent and spontaneous with normal comprehension.  Cognition:    The patient is oriented to person, place, and time;     CRANIAL NERVES: CN II: Visual fields are full to confrontation, pupils were equal round reactive to light CN III, IV, VI: extraocular movement are normal. End gaze horizontal nystagmus, No ptosis. CN V: Facial sensation is intact to pinprick in all 3 divisions bilaterally. Corneal responses are intact.  CN VII: Face is symmetric with normal eye closure and smile. CN VIII: Hearing is normal to rubbing fingers CN IX, X: Palate elevates symmetrically. Phonation is  normal. CN XI: Head turning and shoulder shrug are intact CN XII: Tongue is midline with normal movements and no atrophy.  MOTOR: Moderate spasticity of bilateral lower extremity, right worse than right, bilateral hip flexion 4, ankle dorsiflexion4/5, mild right upper extremity fixation on rapid rotating movement  REFLEXES: Hyperreflexia on both side. Plantar responses are flexor.  SENSORY: Length dependent decreased light touch, vibratory sensation to knee level   COORDINATION: Mild bilateral finger-to-nose dysmetria, mild to moderate  bilateral heel-to-shin dysmetria  GAIT/STANCE: He need to push up to get up from seated position, wide-based, cautious, unsteady gait, needs assistance  DIAGNOSTIC DATA (LABS, IMAGING, TESTING) - I reviewed patient records, labs, notes, testing and imaging myself where available.  Jc virus positive, index 4.15 in July 2014, 2.66 in August 2015. Anti-Tysabri antibody: negative Feb 2016.  MRI brain w/wo in June 2016 at [facility] ([facility]): Stable multiple supratentorium MS lesions, no contrast enhancement, no significant change compared to previous scan in February 2016.  Treatment:   [name] February 2009 till January 2013,  [name], Jan 2014 to Oct 2014.  Restart Tysabri since Oct 2014.    ASSESSMENT AND PLAN  Usbaldo Ishaq is a 45 y.o. male   Relapsing and meeting multiple sclerosis,  I have offered treatment option of lemtrada, he and his wife are concerned about side effect despite going over detailed info with patient, he wants to stay on [name], but willing to have second opinion from [place] specialist Dr. Felecia Shelling  Repeat MRI brain w/wo  Urinary incontinence: Insomnia; Ambien as needed Gait difficulty  Acute worsening gait difficulty since Feb 20 2015, improved with [place] pack    Memory loss Depression:   Effexor has made depression worse, I started Cymbalta 60 mg a day Chronic constipation  Colace 100 mg twice a  day   Marcial Pacas, M.D. Ph.D.  [facility] [place], [place] [address] Phone: [phone] Fax:      [phone]      Marcial Pacas, M.D. Ph.D.  [facility] [address] [place], [place] [address] Ph: [phone] Fax: [phone]

## 2015-06-26 ENCOUNTER — Telehealth: Payer: Self-pay | Admitting: Neurology

## 2015-06-26 NOTE — Telephone Encounter (Signed)
Pt called and is experiencing some constipation on and off. He is going a couple of days with out going for a few weeks now. He would like to know if he can have something to help him. He has not tried anything over the counter. Please call and advise [phone]

## 2015-06-26 NOTE — Telephone Encounter (Signed)
Spoke to patient - he would like to try OTC stool softeners to see if they will be helpful. He will call back if symptoms continue to be a problem.

## 2015-06-27 ENCOUNTER — Telehealth: Payer: Self-pay | Admitting: Neurology

## 2015-06-27 ENCOUNTER — Encounter: Payer: Self-pay | Admitting: *Deleted

## 2015-06-27 NOTE — Telephone Encounter (Signed)
Letter ready for pick up - place up front - patient's wife, Lawrence Lamb (on HIPPA), will pick it up.

## 2015-06-27 NOTE — Telephone Encounter (Signed)
Patient's wife is calling. 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 Please call patient's wife at [phone] and she will pick it up.Thank you.

## 2015-07-17 ENCOUNTER — Encounter: Payer: Self-pay | Admitting: Neurology

## 2015-07-17 ENCOUNTER — Ambulatory Visit (INDEPENDENT_AMBULATORY_CARE_PROVIDER_SITE_OTHER): Payer: PRIVATE HEALTH INSURANCE | Admitting: Neurology

## 2015-07-17 VITALS — BP 112/76 | HR 72 | Resp 16 | Ht 71.0 in | Wt 196.0 lb

## 2015-07-17 DIAGNOSIS — R350 Frequency of micturition: Secondary | ICD-10-CM

## 2015-07-17 DIAGNOSIS — R4189 Other symptoms and signs involving cognitive functions and awareness: Secondary | ICD-10-CM | POA: Diagnosis not present

## 2015-07-17 DIAGNOSIS — R531 Weakness: Secondary | ICD-10-CM | POA: Diagnosis not present

## 2015-07-17 DIAGNOSIS — G4700 Insomnia, unspecified: Secondary | ICD-10-CM | POA: Diagnosis not present

## 2015-07-17 DIAGNOSIS — F32A Depression, unspecified: Secondary | ICD-10-CM

## 2015-07-17 DIAGNOSIS — F329 Major depressive disorder, single episode, unspecified: Secondary | ICD-10-CM | POA: Diagnosis not present

## 2015-07-17 DIAGNOSIS — G35 Multiple sclerosis: Secondary | ICD-10-CM | POA: Diagnosis not present

## 2015-07-17 DIAGNOSIS — R269 Unspecified abnormalities of gait and mobility: Secondary | ICD-10-CM | POA: Diagnosis not present

## 2015-07-17 DIAGNOSIS — R5383 Other fatigue: Secondary | ICD-10-CM

## 2015-07-17 MED ORDER — OXYBUTYNIN CHLORIDE 5 MG PO TABS: 5.0000 mg | ORAL_TABLET | Freq: Three times a day (TID) | ORAL | Status: DC | PRN

## 2015-07-17 MED ORDER — ZOLPIDEM TARTRATE 5 MG PO TABS: 5.0000 mg | ORAL_TABLET | Freq: Every evening | ORAL | Status: DC | PRN

## 2015-07-17 NOTE — Progress Notes (Signed)
[facility]  PATIENT: Vearl Lukomski DOB: [birth year]  REFERRING DOCTOR OR PCP:  Dr. Krista Blue SOURCE: patient, records/reports in EMR, MRI images on PACS  _________________________________   HISTORICAL  CHIEF COMPLAINT:  Chief Complaint  Patient presents with  . Multiple Sclerosis    Aadith is here with his wife [name] for 2nd opinion regarding MS and tx. options.  He is currenlty on Tysabri, but is high JCV ab.   JCV ab last checked 04-25-14.  He would like to discuss [name], but feels he has maintained stablity on Tysabri, and is fearful of decline if he stops it./fim    HISTORY OF PRESENT ILLNESS:   I had the pleasure seeing you patient, Jonpaul Regehr, at [facility].  MS history: He was diagnosed in 2006 after presenting with right facial weakness. MRI of the brain showed classic MS lesions. Initially, he was placed on Betaseron but had some breakthrough disease. He was at on Tysabri and was on that for about 2009 revealed 2013. He was JCV antibody positive. Unfortunately, in 2013 due to insurance issues and moving to a different [place], he was unable to continue Tysabri. During that year he had quite a bit of worsening of his MS symptoms.    In 2014, he started [name] and was on that for about 6 months but had significant worsening of his symptoms. He restarted Tysabri October 2014.Marland Kitchen He is high titer JCV antibody positive with a value of 4.15 in 2014 and 2.66 in 2015.   [name] has been discussed with him the past.     Gait/strength/sensation: He has difficulty with his gait and will sometimes use a cane, especially when outside of the house. Much of the time, he is able to walk a couple 100 feet without a cane. He notes weakness in the right leg but has not reported much issues with spasticity. He denies any painful dysesthesias.  Bladder: He reports urinary frequency and urgency. At night he has nocturia 3 times on average. He denies any  hesitancy.  Vision: He reports difficulty with visual acuity out of the right eye. He has diplopia at times.  Fatigue/sleep: He reports a lot of fatigue. This did not necessarily change when he was on Tysabri. He generally is able to fall asleep easily but will wake up multiple times every night. Some of these times will be to use the bathroom and some of the times he would just wake up. Either way, he has difficulty falling back asleep. He takes zolpidem 5 mg nightly. Trazodone was too strong for him and he had a hangover for several hours in the morning.  Mood/cognition: He has noted some depression. There is also apathy. He denies anxiety. He and his wife have noticed some cognitive difficulties. He has some trouble with word finding, short-term memory and focus.    REVIEW OF SYSTEMS: Constitutional: No fevers, chills, sweats, or change in appetite.  He reports fatigue and insomnia. Eyes: No visual changes, double vision, eye pain Ear, nose and throat: No hearing loss, ear pain, nasal congestion, sore throat Cardiovascular: No chest pain, palpitations Respiratory: No shortness of breath at rest or with exertion.   No wheezes GastrointestinaI: No nausea, vomiting, diarrhea, abdominal pain, fecal incontinence Genitourinary: No dysuria, urinary retention.  He has frequency and nocturia. Musculoskeletal: No neck pain, back pain Integumentary: No rash, pruritus, skin lesions Neurological: as above Psychiatric: Mild depression at this time.  No anxiety Endocrine: No palpitations, diaphoresis, change in appetite, change  in weigh or increased thirst Hematologic/Lymphatic: No anemia, purpura, petechiae. Allergic/Immunologic: No itchy/runny eyes, nasal congestion, recent allergic reactions, rashes  ALLERGIES: No Known Allergies  HOME MEDICATIONS:  Current outpatient prescriptions:  .  baclofen (LIORESAL) 10 MG tablet, TAKE ONE TABLET BY MOUTH THREE TIMES DAILY, Disp: 90 tablet, Rfl:  11 .  Cholecalciferol (VITAMIN D PO), Take by mouth 2 (two) times daily., Disp: , Rfl:  .  docusate sodium (COLACE) 100 MG capsule, Take 1 capsule (100 mg total) by mouth 2 (two) times daily., Disp: 60 capsule, Rfl: 11 .  DULoxetine (CYMBALTA) 60 MG capsule, Take 1 capsule (60 mg total) by mouth daily., Disp: 30 capsule, Rfl: 12 .  loratadine (CLARITIN) 10 MG tablet, Take 10 mg by mouth daily., Disp: , Rfl:  .  methylPREDNISolone (MEDROL DOSEPAK) 4 MG TBPK tablet, Take as instructed., Disp: 21 tablet, Rfl: 0 .  multivitamin-iron-minerals-folic acid (CENTRUM) chewable tablet, Chew 1 tablet by mouth daily., Disp: , Rfl:  .  omega-3 acid ethyl esters (LOVAZA) 1 G capsule, Take 1,000 mg by mouth 2 (two) times daily., Disp: , Rfl:  .  sodium chloride 0.9 % SOLN 100 mL with natalizumab 300 MG/15ML CONC 300 mg, Inject 300 mg into the vein every 28 (twenty-eight) days. Iv infusion, Disp: , Rfl:  .  zolpidem (AMBIEN) 5 MG tablet, Take 1 tablet (5 mg total) by mouth at bedtime as needed for sleep., Disp: 30 tablet, Rfl: 3  PAST MEDICAL HISTORY: Past Medical History  Diagnosis Date  . Multiple sclerosis ([place]) 2007    PAST SURGICAL HISTORY: Past Surgical History  Procedure Laterality Date  . Appendectomy  45 years old  . Vasectomy  08/2013    FAMILY HISTORY: Family History  Problem Relation Age of Onset  . High blood pressure Mother   . High blood pressure Father     SOCIAL HISTORY:  Social History   Social History  . Marital Status: Married    Spouse Name: Eida  . Number of Children: 2  . Years of Education: 12   Occupational History  .      Disabled   Social History Main Topics  . Smoking status: Never Smoker   . Smokeless tobacco: Never Used  . Alcohol Use: No  . Drug Use: No  . Sexual Activity: Not on file   Other Topics Concern  . Not on file   Social History Narrative   Patient lives at home with his wife [name]). Patient has two children. Patient is disabled.   Right  handed.   Caffeine- one cup daily.     PHYSICAL EXAM  Filed Vitals:   07/17/15 1332  BP: 112/76  Pulse: 72  Resp: 16  Height: 5\' 11"  (1.803 m)  Weight: 196 lb (88.905 kg)    Body mass index is 27.35 kg/(m^2).   General: The patient is well-developed and well-nourished and in no acute distress  Eyes:  Funduscopic exam shows normal optic discs and retinal vessels.  Neck: The neck is supple, no carotid bruits are noted.  The neck is nontender.  Cardiovascular: The heart has a regular rate and rhythm with a normal S1 and S2. There were no murmurs, gallops or rubs. Lungs are clear to auscultation.  Skin: Extremities are without significant edema.  Musculoskeletal:  Back is nontender  Neurologic Exam  Mental status: The patient is alert and oriented x 3 at the time of the examination. The patient has reduced recent and better remote memory, with a  reduced attention span and concentration ability.   Speech is normal.  Cranial nerves: Extraocular movements show mild dysconjugate gaze to the right. Pupils are equal, round, and reactive to light and accomodation.  Visual fields are full.  Facial symmetry is present. There is good facial sensation to soft touch bilaterally.Facial strength is normal.  Trapezius and sternocleidomastoid strength is normal. No dysarthria is noted.  The tongue is midline, and the patient has symmetric elevation of the soft palate. No obvious hearing deficits are noted.  Motor:  Muscle bulk is normal.   Tone is increased in legs, a little worse on the right. Strength is  4 / 5 in right leg and 5/5 elsewherees.   Sensory: Sensory testing is reduced to vibraiton on right leg.  Coordination: Cerebellar testing reveals reduced good finger-nose-finger and poor heel-to-shin (worse on right)  Gait and station: Station is normal.   Gait is mildly spastic with reduced stride. Tandem gait is wide. Romberg is negative.   Reflexes: Deep tendon reflexes are increased   bilaterally.   Plantar responses are flexor    DIAGNOSTIC DATA (LABS, IMAGING, TESTING) - I reviewed patient records, labs, notes, testing and imaging myself where available.  Lab Results  Component Value Date   WBC 6.0 07/19/2014   HGB 12.4* 07/19/2014   HCT 38.2* 07/19/2014   MCV 92.7 07/19/2014   PLT 213 07/19/2014      Component Value Date/Time   NA 139 07/19/2014 0456   NA 140 11/22/2013 1550   K 3.7 07/19/2014 0456   CL 106 07/19/2014 0456   CO2 28 07/19/2014 0456   GLUCOSE 98 07/19/2014 0456   GLUCOSE 77 11/22/2013 1550   BUN 9 07/19/2014 0456   BUN 10 11/22/2013 1550   CREATININE 0.92 07/19/2014 0456   CALCIUM 8.4 07/19/2014 0456   PROT 6.5 07/18/2014 0227   PROT 6.9 11/22/2013 1550   ALBUMIN 3.6 07/18/2014 0227   ALBUMIN 4.8 11/22/2013 1550   AST 28 07/18/2014 0227   ALT 37 07/18/2014 0227   ALKPHOS 37* 07/18/2014 0227   BILITOT 0.8 07/18/2014 0227   GFRNONAA >90 07/19/2014 0456   GFRAA >90 07/19/2014 0456   No results found for: CHOL, HDL, LDLCALC, LDLDIRECT, TRIG, CHOLHDL No results found for: HGBA1C Lab Results  Component Value Date   [id number] 11/22/2013   Lab Results  Component Value Date   TSH 0.462 07/18/2014       ASSESSMENT AND PLAN  Multiple sclerosis (H[facility])  Abnormality of gait  Generalized weakness  Disturbed cognition  Other fatigue  Urinary frequency  Insomnia  Depression    1.   We had a long discussion about his options for disease modifying therapies. He is JCV high positive and his risk of PML is about 1:100 to 1:130.   I strongly recommend that he stop Tysabri therapy. Because he progressed significantly on other medications, I recommend that he go on another highly effective therapy. Currently, [name] would be his best option and I went over the pros and cons of that decision.   A second option would be ocrelizumab that may get FDA approved this week I went over that option as well. They will think about these  2 choices and get back to [place] next week. 2.    I have advised him to stop Tysabri as I wouldn't want to have about a two-month washout before either of the 2 options. During the two-month washout, he should get an MRI of the brain  to make sure that there is no evidence of PML. 3.   Oxybutynin for bladder dysfunction. Renew zolpidem.  4.   Return as needed to see Dr. Krista Blue or me if there are further questions.  45 minute face-to-face evaluation with greater than one half of the time counseling correlating care about therapeutic options and symptoms of MS.  Richard A. Felecia Shelling, MD, PhD [id number], [id number] PM Certified in Neurology, Clinical Neurophysiology, Sleep Medicine, Pain Medicine and Neuroimaging  [facility] [address] [place], [place] [address] [phone]

## 2015-08-13 ENCOUNTER — Encounter: Payer: Self-pay | Admitting: Neurology

## 2015-08-13 ENCOUNTER — Ambulatory Visit (INDEPENDENT_AMBULATORY_CARE_PROVIDER_SITE_OTHER): Payer: PRIVATE HEALTH INSURANCE | Admitting: Neurology

## 2015-08-13 VITALS — BP 113/65 | HR 66 | Ht 71.0 in | Wt 196.0 lb

## 2015-08-13 DIAGNOSIS — R269 Unspecified abnormalities of gait and mobility: Secondary | ICD-10-CM

## 2015-08-13 DIAGNOSIS — G35 Multiple sclerosis: Secondary | ICD-10-CM

## 2015-08-13 MED ORDER — FAMCICLOVIR 500 MG PO TABS: 500.0000 mg | ORAL_TABLET | Freq: Three times a day (TID) | ORAL | Status: DC

## 2015-08-13 NOTE — Progress Notes (Addendum)
Chief Complaint  Patient presents with  . Multiple Sclerosis    Lawrence Lamb is here with his wife, [name], to discuss [place].      PATIENT: Lawrence Lamb DOB: [birth year]  Chief Complaint  Patient presents with  . Multiple Sclerosis    Lawrence Lamb is here with his wife, [name], to discuss [place].    HISTORICAL  History of Present Illness:  Lawrence Lamb is a 45 years old right-handed African American male, follow-up for relapsing remediating multiple sclerosis  Lawrence Lamb moved from [place] to [place] in 2013, was previously under the care of [place] MS specialist Dr. [name],  Lawrence Lamb was diagnosed with relapsing remitting multiple sclerosis in early 2007, presenting with right face, right leg weakness, has mild gait difficulty, diagnosis was confirmed by marked abnormal MRI of brain, and cervical lesions   Lawrence Lamb was initially treated with Betaseron for 18 months, clinically Lawrence Lamb was stable, but there was enhancing MRI lesions on Betaseron, Lawrence Lamb developed positive interferon antibodies.  Tysabri was started since February 2009, initially every 4 weeks, later stretching to every 6 weeks, JC virus antibody was negative in September 2009 and April 2010, but JC virus antibody began to be positive since September 2010, Lawrence Lamb tolerates the [name] infusion very well from Feb 2009 till Jan 2013. Lawrence Lamb has repeat MRI of brain every 6 months.    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  Lawrence Lamb was treated with [name] from January 24th, 2014 for 6 months, but Lawrence Lamb continued to have slow progressive worsening gait difficulty, fell multiple times, had few rounds of steroid.  Abnormal visual evoked response test secondary to prolongation of the P100 waveform on the left.    Lawrence Lamb now complains of constant gait difficulty, bilateral lower extremity spasticity, also  complains of urinary incontinence, double vision, Lawrence Lamb could no longer driving,  also complains of worsening memory trouble, difficulty focusing, could not take care of his young children by himself.     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, index value is 4.15 in June 2014, 2.66 in 11/22/2013   I have discussed treatment options with patient and his wife, Lawrence Lamb had progressive worsening gait difficulty, memory trouble, vision problem since Lawrence Lamb was off [name], taking [name] treatment,  His symptoms were relatively stable while taking [name] is 10/998 chance of PML,  Lawrence Lamb restarted on [name] infusion  since Oct 14th 2014   UPDATE Sept 2015 During followup visit, Lawrence Lamb continued to complain progressive worsening gait difficulty, spasticity of lower extremity muscles, clumsiness of bilateral hands, fatigue, memory trouble, difficulty focusing, double vision, Lawrence Lamb is no longer driving, walking with assistance, difficulty keeping his young children at home.   We have reviewed MRI of the brain with without contrast August 2015, extensive supratentorium, and posterior fossa lesions, no significant progression compared to previous scans in March 2015,  MRI cervical with without contrast also demonstrated prolonged segments of lesions involving midcervical cord, no contrast enhancement  UPDATE Dec 10th 2015: Lawrence Lamb was evaluated by [facility] Drs. Zeid  January 10 2014, who agreed on continue [place] treatment, patient likely enter into the degenerative stages of his MS, but Lawrence Lamb continued to complain gradual decline, [name] still is reasonable choice, Lawrence Lamb is forgetful, waking up at evening, Lawrence Lamb complains of double vision. Lawrence Lamb has tried Ampyra 10 mg bid since 2015, did not have significant side effect, which did  help his walking  UPDATE Feb 11th 2016: His walking has much improved with Ampyra twice a day, no significant side effect, Lawrence Lamb did not had MRI of brain, cervical spine as previously scheduled,    UPDATE October 10 2014: Lawrence Lamb was admitted to hospital March 28-30 2016 for flulike illness, Lawrence Lamb had much worsening gait difficulty ever since, now rely on his forefoot cane, Lawrence Lamb also complains of worsening nocturia, wake up multiple times during night, depressed Lawrence Lamb could not contribute much to his family, Lawrence Lamb is still waiting for social disability applications.   Update August the second 2016: Lawrence Lamb continue complains of gait difficulty, right worse than left, rely on his cane, Effexor has been helpful for his depression, complains of worsening memory trouble Anti-[name] antibody was negative. Lawrence Lamb and his wife concerned about the side effect of lemtrada, wants to stay on Tysabri.  I have reviewed and compared MRI scan in June 2016 at [facility] to February 2016 MRIs, multiple supratentorium lesions, T1 black holes, no contrast enhancement, no significant change compared to previous scans,  UPDATE Feb 21 2015: Lawrence Lamb started to feel increaesd weakness since Nov 1st, right leg worse than left, Lawrence Lamb took a shower, his leg gave out, fell down, difficult to get out of bathtub, Lawrence Lamb also has chronic constipations   UPDATE May 09 2015: Lawrence Lamb was treated with one course of Medrol pack, his walking has improved some, but overall has slow progress in gait difficulty, worsening memory trouble, Lawrence Lamb can no longer babysit his young children at home, mild depression, Effexor has made his depression worse, worsening constipations, failed to improve by over-the-counter medications  UPDATE August 13 2015: Last [name] infusion was in July 09 2015, Lawrence Lamb was evaluated by Dr. Felecia Shelling in March 20 eighth 2017, Lawrence Lamb is at high risk to develop complication with continued [name] infusion, in addition, Lawrence Lamb continue have progressive worsening gait difficulty on [name], frequent flareups, we decided to proceed with [name],  REVIEW OF SYSTEMS: Full 14 system review of systems performed and notable only for double vision, frequent awakening, walking  difficulty, memory loss, depression hallucinations.  ALLERGIES: No Known Allergies  HOME MEDICATIONS: Current Outpatient Prescriptions  Medication Sig Dispense Refill  . Alemtuzumab ([name]) 12 MG/1.2ML SOLN Inject into the vein.    . baclofen (LIORESAL) 10 MG tablet TAKE ONE TABLET BY MOUTH THREE TIMES DAILY 90 tablet 11  . Cholecalciferol (VITAMIN D PO) Take by mouth 2 (two) times daily.    Marland Kitchen docusate sodium (COLACE) 100 MG capsule Take 1 capsule (100 mg total) by mouth 2 (two) times daily. 60 capsule 11  . DULoxetine (CYMBALTA) 60 MG capsule Take 1 capsule (60 mg total) by mouth daily. 30 capsule 12  . loratadine (CLARITIN) 10 MG tablet Take 10 mg by mouth daily.    . methylPREDNISolone (MEDROL DOSEPAK) 4 MG TBPK tablet Take as instructed. 21 tablet 0  . multivitamin-iron-minerals-folic acid (CENTRUM) chewable tablet Chew 1 tablet by mouth daily.    Marland Kitchen omega-3 acid ethyl esters (LOVAZA) 1 G capsule Take 1,000 mg by mouth 2 (two) times daily.    Marland Kitchen oxybutynin (DITROPAN) 5 MG tablet Take 1 tablet (5 mg total) by mouth every 8 (eight) hours as needed for bladder spasms. 90 tablet 11  . sodium chloride 0.9 % SOLN 100 mL with natalizumab 300 MG/15ML CONC 300 mg Inject 300 mg into the vein every 28 (twenty-eight) days. Iv infusion    . zolpidem (AMBIEN) 5 MG tablet Take 1 tablet (5 mg  total) by mouth at bedtime as needed for sleep. 30 tablet 3   No current facility-administered medications for this visit.    PAST MEDICAL HISTORY: Past Medical History  Diagnosis Date  . Multiple sclerosis ([place]) 2007    PAST SURGICAL HISTORY: Past Surgical History  Procedure Laterality Date  . Appendectomy  45 years old  . Vasectomy  08/2013    FAMILY HISTORY: Family History  Problem Relation Age of Onset  . High blood pressure Mother   . High blood pressure Father     SOCIAL HISTORY:  Social History   Social History  . Marital Status: Married    Spouse Name: Eida  . Number of Children: 2   . Years of Education: 12   Occupational History  .      Disabled   Social History Main Topics  . Smoking status: Never Smoker   . Smokeless tobacco: Never Used  . Alcohol Use: No  . Drug Use: No  . Sexual Activity: Not on file   Other Topics Concern  . Not on file   Social History Narrative   Patient lives at home with his wife [name]). Patient has two children. Patient is disabled.   Right handed.   Caffeine- one cup daily.     PHYSICAL EXAM   Filed Vitals:   08/13/15 1652  BP: 113/65  Pulse: 66  Height: 5\' 11"  (1.803 m)  Weight: 196 lb (88.905 kg)    Not recorded      Body mass index is 27.35 kg/(m^2).  PHYSICAL EXAMNIATION:  Gen: NAD, conversant, well nourised, obese, well groomed                     Cardiovascular: Regular rate rhythm, no peripheral edema, warm, nontender. Eyes: Conjunctivae clear without exudates or hemorrhage Neck: Supple, no carotid bruise. Pulmonary: Clear to auscultation bilaterally   NEUROLOGICAL EXAM:  MENTAL STATUS: Speech:    Speech is normal; fluent and spontaneous with normal comprehension.  Cognition:    The patient is oriented to person, place, and time;     CRANIAL NERVES: CN II: Visual fields are full to confrontation, pupils were equal round reactive to light CN III, IV, VI: extraocular movement are normal. End gaze horizontal nystagmus, No ptosis. CN V: Facial sensation is intact to pinprick in all 3 divisions bilaterally. Corneal responses are intact.  CN VII: Face is symmetric with normal eye closure and smile. CN VIII: Hearing is normal to rubbing fingers CN IX, X: Palate elevates symmetrically. Phonation is normal. CN XI: Head turning and shoulder shrug are intact CN XII: Tongue is midline with normal movements and no atrophy.  MOTOR: Moderate spasticity of bilateral lower extremity, right worse than right, bilateral hip flexion 4, ankle dorsiflexion4/5, mild right upper extremity fixation on rapid rotating  movement  REFLEXES: Hyperreflexia on both side. Plantar responses are flexor.  SENSORY: Length dependent decreased light touch, vibratory sensation to knee level   COORDINATION: Mild bilateral finger-to-nose dysmetria, mild to moderate bilateral heel-to-shin dysmetria  GAIT/STANCE: Lawrence Lamb need to push up to get up from seated position, wide-based, cautious, unsteady gait, needs assistance  DIAGNOSTIC DATA (LABS, IMAGING, TESTING) - I reviewed patient records, labs, notes, testing and imaging myself where available.  Jc virus positive, index 4.15 in July 2014, 2.66 in August 2015. Anti-Tysabri antibody: negative Feb 2016.  MRI brain w/wo in June 2016 at [facility] ([place]): Stable multiple supratentorium MS lesions, no contrast enhancement, no significant change compared  to previous scan in February 2016.  Treatment:   [name] February 2009 till January 2013, Last [name] infusion July 09 2015.  Melchor Amour, Jan 2014 to Oct 2014.  Restart Tysabri since Oct 2014.    ASSESSMENT AND PLAN  Lawrence Lamb is a 45 y.o. male   Relapsing and meeting multiple sclerosis,  Repeat MRI of the brain with and without contrast  After discussed with patient and 2nd opinion, we decided to proceed with [name], we went over potentia risk and benefit of the treatment  Lab evaluation,  Famciclovir 500mg  tid  Refer to dermatologist for skin survery.  Urinary incontinence: Insomnia; Ambien as needed Gait difficulty  Acute worsening gait difficulty since Feb 20 2015, improved with [place] pack    Memory loss Depression:   Effexor has made depression worse, I started Cymbalta 60 mg a day Chronic constipation  Colace 100 mg twice a day   Lawrence Lamb, M.D. Ph.D.  [facility] [place], [place] [address] Phone: [phone] Fax:      [phone]      Lawrence Lamb, M.D. Ph.D.  [facility] [address] [place], [place]  [address] Ph: [phone] Fax: [phone]   I have reviewed hospital admission on November 04 2015 to [facility],  Patient woke up November 03 2015 was found to be confused, had a fever of 102, Lawrence Lamb just finished Lematrada infusion on October 29 2015, upon presentation, blood pressure was 119/54, heart rate of 79, temperature 99.2, patient was documented as low alert, no skin rash, blood culture was sent, Lawrence Lamb was treated with Levaquin, next  CT of the chest without contrast showed minimum bilateral dependent subsegmental atelectasis, no acute abnormality, MRI of the brain with and without contrast on November 05 2015 showed advanced chronic demyelinating disease throughout the brain, unchanged from January 2017, no new lesion  X-rays was negative for pneumonia,  EKG showed sinus rhythm with heart rate of 97   Laboratory evaluation, glucose 94, creatinine 0.91, and Was mildly elevated 13 point 9, CPK was 282, WBC was 7.8, hemoglobin was 13 point 5, neutrophil was 6 5.6%, influenza a antibody was positive, UA showed negative leukoesterase, negative nitrate,

## 2015-08-14 LAB — CBC WITH DIFFERENTIAL
Basophils Absolute: 0.1 10*3/uL (ref 0.0–0.2)
Basos: 1 %
EOS (ABSOLUTE): 0.4 10*3/uL (ref 0.0–0.4)
Eos: 5 %
Hematocrit: 40.6 % (ref 37.5–51.0)
Hemoglobin: 13.1 g/dL (ref 12.6–17.7)
Immature Grans (Abs): 0 10*3/uL (ref 0.0–0.1)
Immature Granulocytes: 0 %
Lymphocytes Absolute: 3.5 10*3/uL — ABNORMAL HIGH (ref 0.7–3.1)
Lymphs: 42 %
MCH: 30 pg (ref 26.6–33.0)
MCHC: 32.3 g/dL (ref 31.5–35.7)
MCV: 93 fL (ref 79–97)
Monocytes Absolute: 0.5 10*3/uL (ref 0.1–0.9)
Monocytes: 6 %
Neutrophils Absolute: 4 10*3/uL (ref 1.4–7.0)
Neutrophils: 46 %
RBC: 4.36 x10E6/uL (ref 4.14–5.80)
RDW: 13.9 % (ref 12.3–15.4)
WBC: 8.4 10*3/uL (ref 3.4–10.8)

## 2015-08-14 LAB — URINALYSIS, ROUTINE W REFLEX MICROSCOPIC
Bilirubin, UA: NEGATIVE
Glucose, UA: NEGATIVE
Ketones, UA: NEGATIVE
Leukocytes, UA: NEGATIVE
Nitrite, UA: NEGATIVE
Protein, UA: NEGATIVE
RBC, UA: NEGATIVE
Specific Gravity, UA: 1.02 (ref 1.005–1.030)
Urobilinogen, Ur: 1 mg/dL (ref 0.2–1.0)
pH, UA: 6.5 (ref 5.0–7.5)

## 2015-08-14 LAB — COMPREHENSIVE METABOLIC PANEL
ALT: 37 IU/L (ref 0–44)
AST: 21 IU/L (ref 0–40)
Albumin/Globulin Ratio: 1.8 (ref 1.2–2.2)
Albumin: 4.6 g/dL (ref 3.5–5.5)
Alkaline Phosphatase: 40 IU/L (ref 39–117)
BUN/Creatinine Ratio: 11 (ref 9–20)
BUN: 10 mg/dL (ref 6–24)
Bilirubin Total: 0.4 mg/dL (ref 0.0–1.2)
CO2: 26 mmol/L (ref 18–29)
Calcium: 9.4 mg/dL (ref 8.7–10.2)
Chloride: 103 mmol/L (ref 96–106)
Creatinine, Ser: 0.87 mg/dL (ref 0.76–1.27)
GFR calc Af Amer: 120 mL/min/{1.73_m2} (ref >59)
GFR calc non Af Amer: 104 mL/min/{1.73_m2} (ref >59)
Globulin, Total: 2.5 g/dL (ref 1.5–4.5)
Glucose: 94 mg/dL (ref 65–99)
Potassium: 4.6 mmol/L (ref 3.5–5.2)
Sodium: 144 mmol/L (ref 134–144)
Total Protein: 7.1 g/dL (ref 6.0–8.5)

## 2015-08-14 LAB — HEPATITIS B SURFACE ANTIGEN: Hepatitis B Surface Ag: NEGATIVE

## 2015-08-14 LAB — HEPATITIS C ANTIBODY: Hep C Virus Ab: <0.1 s/co ratio (ref 0.0–0.9)

## 2015-08-14 LAB — HEPATITIS B SURFACE ANTIBODY,QUALITATIVE: Hep B Surface Ab, Qual: NONREACTIVE

## 2015-08-14 LAB — VARICELLA ZOSTER ANTIBODY, IGG: Varicella zoster IgG: 1657 index (ref >165)

## 2015-08-14 LAB — TSH: TSH: 0.792 u[IU]/mL (ref 0.450–4.500)

## 2015-08-15 ENCOUNTER — Telehealth: Payer: Self-pay | Admitting: Neurology

## 2015-08-15 NOTE — Telephone Encounter (Signed)
[employer] [phone] x [id number] called inquiring if pt is still taking tysabri

## 2015-08-15 NOTE — Telephone Encounter (Signed)
Returned call to [facility] - patient is no longer on Tysabri.

## 2015-08-16 ENCOUNTER — Telehealth: Payer: Self-pay | Admitting: Neurology

## 2015-08-16 LAB — QUANTIFERON IN TUBE
QFT TB AG MINUS NIL VALUE: 0 IU/mL
QUANTIFERON MITOGEN VALUE: >10 IU/mL
QUANTIFERON TB AG VALUE: 0.06 IU/mL
QUANTIFERON TB GOLD: NEGATIVE
Quantiferon Nil Value: 0.06 IU/mL

## 2015-08-16 LAB — QUANTIFERON TB GOLD ASSAY (BLOOD)

## 2015-08-16 MED ORDER — VALACYCLOVIR HCL 500 MG PO TABS: 500.0000 mg | ORAL_TABLET | Freq: Two times a day (BID) | ORAL | Status: DC

## 2015-08-16 NOTE — Telephone Encounter (Signed)
I have called patient, will change his herpes preventive medications to valtrex 500 mg twice a day, new rx was called in.

## 2015-08-22 ENCOUNTER — Telehealth: Payer: Self-pay | Admitting: Neurology

## 2015-08-22 NOTE — Telephone Encounter (Signed)
Patient aware to start his Valtrex on the first day of his [name] infusion.

## 2015-08-22 NOTE — Telephone Encounter (Signed)
Pt called in about taking a "virus" medication. He does not know the name. Said he needs to take it before he starts another medication Alemtuzumab ([name]) 12 MG/1.2ML SOLN. Please call and advise [phone]

## 2015-08-22 NOTE — Telephone Encounter (Signed)
Attempted to return patient's call - no answer or voicemail set up - will try to reach again today.

## 2015-08-22 NOTE — Telephone Encounter (Signed)
Patient is returning your call.  

## 2015-08-29 NOTE — Telephone Encounter (Signed)
Returned call to let him know the medication is generic Valtrex.

## 2015-08-29 NOTE — Telephone Encounter (Signed)
Patient is calling back about "virus" medication that he is supposed to take before [facility], can't remember the name of it. Needs to know the name of it and when can he pick up the medication?

## 2015-09-04 ENCOUNTER — Telehealth: Payer: Self-pay | Admitting: Neurology

## 2015-09-04 DIAGNOSIS — G35 Multiple sclerosis: Secondary | ICD-10-CM

## 2015-09-04 DIAGNOSIS — R269 Unspecified abnormalities of gait and mobility: Secondary | ICD-10-CM

## 2015-09-04 NOTE — Telephone Encounter (Signed)
Spoke to [place], he would like to go ahead and be referred for an evaluation for a mobility device (scooter). Ok, per vo by Dr. Krista Blue, to place referral for eval by PT.

## 2015-09-04 NOTE — Telephone Encounter (Signed)
Patient is calling and states he is approved for Medicaid and would now like a Rx for a mobility device(scooter).  Please call.

## 2015-09-12 ENCOUNTER — Other Ambulatory Visit: Payer: PRIVATE HEALTH INSURANCE

## 2015-09-13 ENCOUNTER — Ambulatory Visit
Admission: RE | Admit: 2015-09-13 | Discharge: 2015-09-13 | Disposition: A | Discharge status: Home / Self Care | Payer: PRIVATE HEALTH INSURANCE | Source: Ambulatory Visit | Attending: Neurology | Admitting: Neurology

## 2015-09-13 DIAGNOSIS — R269 Unspecified abnormalities of gait and mobility: Secondary | ICD-10-CM | POA: Diagnosis not present

## 2015-09-13 DIAGNOSIS — G35 Multiple sclerosis: Secondary | ICD-10-CM

## 2015-09-13 MED ORDER — GADOBENATE DIMEGLUMINE 529 MG/ML IV SOLN
18.0000 mL | Freq: Once | INTRAVENOUS | Status: AC | PRN
  Administered 2015-09-13: 18 mL via INTRAVENOUS

## 2015-09-20 ENCOUNTER — Telehealth: Payer: Self-pay | Admitting: Neurology

## 2015-09-20 NOTE — Telephone Encounter (Signed)
Returned call to [name] to let her know I received her message. Information provided to [employer] so updated approval can be obtained.  Dr. Krista Blue aware of situation.

## 2015-09-20 NOTE — Telephone Encounter (Signed)
Patient's wife is calling to advise that the patient has [employer] as primary, [employer] secondary and Medicaid third. She would like a call back to discuss.

## 2015-10-01 DIAGNOSIS — D1724 Benign lipomatous neoplasm of skin and subcutaneous tissue of left leg: Secondary | ICD-10-CM | POA: Diagnosis not present

## 2015-10-01 DIAGNOSIS — L821 Other seborrheic keratosis: Secondary | ICD-10-CM | POA: Diagnosis not present

## 2015-10-04 DIAGNOSIS — G35 Multiple sclerosis: Secondary | ICD-10-CM | POA: Diagnosis not present

## 2015-10-04 DIAGNOSIS — Z79899 Other long term (current) drug therapy: Secondary | ICD-10-CM | POA: Diagnosis not present

## 2015-10-05 ENCOUNTER — Telehealth: Payer: Self-pay | Admitting: Neurology

## 2015-10-05 NOTE — Telephone Encounter (Signed)
Wife [name] called to advise, husband was seen in ER yesterday for relapse, states "he's still kind of not right", should he come in to be seen or what to do? Also wants to know status of PA for [facility], states "that's the reason he relapsed, he hasn't been on medication for 3 months". Please call to advise.

## 2015-10-05 NOTE — Telephone Encounter (Signed)
Please ask patient to come in for solumedrol iv infusion 1000mg  iv daily x 3days, also check on [name] status.

## 2015-10-08 NOTE — Telephone Encounter (Signed)
Patient will be here at 1pm for Solu-Medrol at 1pm.  Otila Kluver will check on the status of his PA for [facility].

## 2015-10-09 ENCOUNTER — Telehealth: Payer: Self-pay | Admitting: Neurology

## 2015-10-09 NOTE — Telephone Encounter (Signed)
Patient called requesting a letter to be excused from trip to [place] (leaving on Thursday 6/22, coming back on Sunday 6/25) and why it's not good for him to go ie: infusion for 3 days in a row and could relapse. Please call [phone]. Patient now states he has appointment today and will talk to [facility] when he's here.

## 2015-10-09 NOTE — Telephone Encounter (Signed)
Dr. Krista Blue has spoken with Lawrence Lamb today and provided him with the requested letter.

## 2015-10-09 NOTE — Telephone Encounter (Signed)
He reported significant improvement with IV steroid treatment, I will cancel third day treatment, also generated the letter to cancel his planned flight trip because of MS exacerbation worsening gait difficulty.

## 2015-10-10 ENCOUNTER — Telehealth: Payer: Self-pay | Admitting: Neurology

## 2015-10-10 NOTE — Telephone Encounter (Signed)
Spoke to Heri - he has received his co-pay assistance card in the mail.  Also, [name] has been approved by [employer] from 09/12/15 to 04/20/16 for his 5 day infusion (Authorization [id number]).  [employer] has faxed over the approval letter and it has been provided to [employer].

## 2015-10-10 NOTE — Telephone Encounter (Signed)
Patient called regarding [name] Co-pay card he received in the mail, states he forgot to talk to Dr. Krista Blue about this at visit yesterday. Please call to discuss.

## 2015-10-11 ENCOUNTER — Other Ambulatory Visit: Payer: Self-pay | Admitting: *Deleted

## 2015-10-11 ENCOUNTER — Other Ambulatory Visit: Payer: Self-pay | Admitting: Neurology

## 2015-10-11 DIAGNOSIS — G35 Multiple sclerosis: Secondary | ICD-10-CM

## 2015-10-15 ENCOUNTER — Other Ambulatory Visit (INDEPENDENT_AMBULATORY_CARE_PROVIDER_SITE_OTHER): Payer: Self-pay

## 2015-10-15 DIAGNOSIS — G35 Multiple sclerosis: Secondary | ICD-10-CM

## 2015-10-15 DIAGNOSIS — Z0289 Encounter for other administrative examinations: Secondary | ICD-10-CM

## 2015-10-16 LAB — COMPREHENSIVE METABOLIC PANEL
ALT: 23 IU/L (ref 0–44)
AST: 18 IU/L (ref 0–40)
Albumin/Globulin Ratio: 1.8 (ref 1.2–2.2)
Albumin: 4.4 g/dL (ref 3.5–5.5)
Alkaline Phosphatase: 40 IU/L (ref 39–117)
BUN/Creatinine Ratio: 11 (ref 9–20)
BUN: 12 mg/dL (ref 6–24)
Bilirubin Total: 0.4 mg/dL (ref 0.0–1.2)
CO2: 25 mmol/L (ref 18–29)
Calcium: 9.4 mg/dL (ref 8.7–10.2)
Chloride: 101 mmol/L (ref 96–106)
Creatinine, Ser: 1.14 mg/dL (ref 0.76–1.27)
GFR calc Af Amer: 89 mL/min/{1.73_m2} (ref >59)
GFR calc non Af Amer: 77 mL/min/{1.73_m2} (ref >59)
Globulin, Total: 2.5 g/dL (ref 1.5–4.5)
Glucose: 91 mg/dL (ref 65–99)
Potassium: 4.8 mmol/L (ref 3.5–5.2)
Sodium: 140 mmol/L (ref 134–144)
Total Protein: 6.9 g/dL (ref 6.0–8.5)

## 2015-10-16 LAB — CBC WITH DIFFERENTIAL/PLATELET
Basophils Absolute: 0 10*3/uL (ref 0.0–0.2)
Basos: 0 %
EOS (ABSOLUTE): 0.1 10*3/uL (ref 0.0–0.4)
Eos: 1 %
Hematocrit: 42.4 % (ref 37.5–51.0)
Hemoglobin: 14.1 g/dL (ref 12.6–17.7)
Immature Grans (Abs): 0.1 10*3/uL (ref 0.0–0.1)
Immature Granulocytes: 1 %
Lymphocytes Absolute: 2.6 10*3/uL (ref 0.7–3.1)
Lymphs: 31 %
MCH: 29.9 pg (ref 26.6–33.0)
MCHC: 33.3 g/dL (ref 31.5–35.7)
MCV: 90 fL (ref 79–97)
Monocytes Absolute: 0.5 10*3/uL (ref 0.1–0.9)
Monocytes: 6 %
Neutrophils Absolute: 4.9 10*3/uL (ref 1.4–7.0)
Neutrophils: 61 %
Platelets: 347 10*3/uL (ref 150–379)
RBC: 4.71 x10E6/uL (ref 4.14–5.80)
RDW: 13.4 % (ref 12.3–15.4)
WBC: 8.2 10*3/uL (ref 3.4–10.8)

## 2015-10-16 LAB — URINALYSIS, ROUTINE W REFLEX MICROSCOPIC
Bilirubin, UA: NEGATIVE
Glucose, UA: NEGATIVE
Ketones, UA: NEGATIVE
Leukocytes, UA: NEGATIVE
Nitrite, UA: NEGATIVE
Protein, UA: NEGATIVE
RBC, UA: NEGATIVE
Specific Gravity, UA: 1.012 (ref 1.005–1.030)
Urobilinogen, Ur: 0.2 mg/dL (ref 0.2–1.0)
pH, UA: 7 (ref 5.0–7.5)

## 2015-10-16 LAB — TSH: TSH: 0.605 u[IU]/mL (ref 0.450–4.500)

## 2015-10-16 LAB — HEPATITIS C ANTIBODY: Hep C Virus Ab: <0.1 s/co ratio (ref 0.0–0.9)

## 2015-10-16 LAB — HEPATITIS B SURFACE ANTIGEN: Hepatitis B Surface Ag: NEGATIVE

## 2015-10-16 LAB — HEPATITIS B SURFACE ANTIBODY,QUALITATIVE: Hep B Surface Ab, Qual: NONREACTIVE

## 2015-10-19 LAB — QUANTIFERON IN TUBE
QFT TB AG MINUS NIL VALUE: 0.01 IU/mL
QUANTIFERON MITOGEN VALUE: 9.53 IU/mL
QUANTIFERON TB AG VALUE: 0.03 IU/mL
QUANTIFERON TB GOLD: NEGATIVE
Quantiferon Nil Value: 0.02 IU/mL

## 2015-10-19 LAB — QUANTIFERON TB GOLD ASSAY (BLOOD)

## 2015-11-03 DIAGNOSIS — R404 Transient alteration of awareness: Secondary | ICD-10-CM | POA: Diagnosis not present

## 2015-11-03 DIAGNOSIS — R531 Weakness: Secondary | ICD-10-CM | POA: Diagnosis not present

## 2015-11-03 DIAGNOSIS — R509 Fever, unspecified: Secondary | ICD-10-CM | POA: Diagnosis not present

## 2015-11-04 ENCOUNTER — Telehealth: Payer: Self-pay | Admitting: Neurology

## 2015-11-04 DIAGNOSIS — R4182 Altered mental status, unspecified: Secondary | ICD-10-CM | POA: Diagnosis not present

## 2015-11-04 DIAGNOSIS — G35 Multiple sclerosis: Secondary | ICD-10-CM | POA: Diagnosis not present

## 2015-11-04 DIAGNOSIS — G4700 Insomnia, unspecified: Secondary | ICD-10-CM | POA: Diagnosis present

## 2015-11-04 DIAGNOSIS — T50995A Adverse effect of other drugs, medicaments and biological substances, initial encounter: Secondary | ICD-10-CM | POA: Diagnosis not present

## 2015-11-04 DIAGNOSIS — J189 Pneumonia, unspecified organism: Secondary | ICD-10-CM | POA: Diagnosis not present

## 2015-11-04 DIAGNOSIS — R531 Weakness: Secondary | ICD-10-CM | POA: Diagnosis not present

## 2015-11-04 DIAGNOSIS — Z79899 Other long term (current) drug therapy: Secondary | ICD-10-CM | POA: Diagnosis not present

## 2015-11-04 DIAGNOSIS — R509 Fever, unspecified: Secondary | ICD-10-CM | POA: Diagnosis not present

## 2015-11-04 DIAGNOSIS — F329 Major depressive disorder, single episode, unspecified: Secondary | ICD-10-CM | POA: Diagnosis present

## 2015-11-04 DIAGNOSIS — E873 Alkalosis: Secondary | ICD-10-CM | POA: Diagnosis not present

## 2015-11-04 DIAGNOSIS — J9811 Atelectasis: Secondary | ICD-10-CM | POA: Diagnosis not present

## 2015-11-04 NOTE — Telephone Encounter (Addendum)
I have talked with Dr. Quillian Quince, on call physician for [facility], updated him on the patient's info. He has RRMS, Jc virus positive with titer of 4.15, last Tysarbrin infusion was on March 20th 2017.  1st round of [name] infusion from July 10-14th, wife called earlier, he spiked fever on July 15th, was admitted to [facility] on July 15th for possible pneumonia. He is now treated with antibiotics, he should also take Valtrex 500mg  bid for virus preventions.  He is at increased risk for CNS infection, he should have MRI of brain w/wo to rule out CNS infections.

## 2015-11-05 ENCOUNTER — Telehealth: Payer: Self-pay | Admitting: Neurology

## 2015-11-05 NOTE — Telephone Encounter (Signed)
I talked with [facility] hospitalist Dr. Bradley Ferris patient was diagnosed with pneumonia, was treated with antibiotic, overall has improved, fever is trending down, chest x-ray showed pulmonary infiltrate  I have updated on Dr. Ronne Binning patient information, he is on valtrex 500 mg twice a day, repeat MRI brain w/wo showed no significant changes.

## 2015-11-05 NOTE — Telephone Encounter (Signed)
Would you please connect me to the [facility] hospitalist taking care of him Dr. Rowe Pavy by [phone]

## 2015-11-05 NOTE — Telephone Encounter (Signed)
Dr. Rowe Pavy is off today.  Dr. Ronne Binning is being paged to return the call.

## 2015-11-05 NOTE — Telephone Encounter (Signed)
Dr. Krista Blue is speaking with Dr. Ronne Binning at [facility].

## 2015-11-05 NOTE — Telephone Encounter (Signed)
Wife [name] [phone] called to advise, patient is in [facility], Dr. Michae Kava on patient today is Dr. Mckinley Jewel, states patient had MRI and CT Scan this morning and they have not gotten the results of these tests.

## 2015-11-07 NOTE — Telephone Encounter (Signed)
Patient's wife is calling stating the patient was discharged from [facility] today and wants to know if the patient should schedule a follow up appointment with Dr. Krista Blue. Please call and advise.

## 2015-11-07 NOTE — Telephone Encounter (Signed)
Left message for a return call from [place].

## 2015-11-08 NOTE — Telephone Encounter (Signed)
Spoke to [employer] - says Lawrence Lamb is at home now and feeling better.  He has a follow up appt pending w/ Dr. Krista Blue.  She will call us with any concerning symptoms and we will be glad to get him worked-in sooner, if needed.

## 2015-11-08 NOTE — Telephone Encounter (Signed)
[name] Elgin/Wife, returned [name]'s call. Please call [phone].

## 2015-12-03 DIAGNOSIS — I10 Essential (primary) hypertension: Secondary | ICD-10-CM | POA: Diagnosis not present

## 2015-12-03 DIAGNOSIS — Z79899 Other long term (current) drug therapy: Secondary | ICD-10-CM | POA: Diagnosis not present

## 2015-12-07 DIAGNOSIS — G35 Multiple sclerosis: Secondary | ICD-10-CM | POA: Diagnosis not present

## 2015-12-07 DIAGNOSIS — R2689 Other abnormalities of gait and mobility: Secondary | ICD-10-CM | POA: Diagnosis not present

## 2015-12-07 DIAGNOSIS — R262 Difficulty in walking, not elsewhere classified: Secondary | ICD-10-CM | POA: Diagnosis not present

## 2015-12-07 DIAGNOSIS — Z6827 Body mass index (BMI) 27.0-27.9, adult: Secondary | ICD-10-CM | POA: Diagnosis not present

## 2015-12-07 DIAGNOSIS — M6281 Muscle weakness (generalized): Secondary | ICD-10-CM | POA: Diagnosis not present

## 2015-12-07 DIAGNOSIS — R269 Unspecified abnormalities of gait and mobility: Secondary | ICD-10-CM | POA: Diagnosis not present

## 2015-12-10 DIAGNOSIS — G35 Multiple sclerosis: Secondary | ICD-10-CM | POA: Diagnosis not present

## 2015-12-10 DIAGNOSIS — R262 Difficulty in walking, not elsewhere classified: Secondary | ICD-10-CM | POA: Diagnosis not present

## 2015-12-10 DIAGNOSIS — M6281 Muscle weakness (generalized): Secondary | ICD-10-CM | POA: Diagnosis not present

## 2015-12-10 DIAGNOSIS — R269 Unspecified abnormalities of gait and mobility: Secondary | ICD-10-CM | POA: Diagnosis not present

## 2015-12-12 DIAGNOSIS — G35 Multiple sclerosis: Secondary | ICD-10-CM | POA: Diagnosis not present

## 2015-12-12 DIAGNOSIS — R269 Unspecified abnormalities of gait and mobility: Secondary | ICD-10-CM | POA: Diagnosis not present

## 2015-12-12 DIAGNOSIS — R262 Difficulty in walking, not elsewhere classified: Secondary | ICD-10-CM | POA: Diagnosis not present

## 2015-12-12 DIAGNOSIS — M6281 Muscle weakness (generalized): Secondary | ICD-10-CM | POA: Diagnosis not present

## 2015-12-14 DIAGNOSIS — G35 Multiple sclerosis: Secondary | ICD-10-CM | POA: Diagnosis not present

## 2015-12-14 DIAGNOSIS — R269 Unspecified abnormalities of gait and mobility: Secondary | ICD-10-CM | POA: Diagnosis not present

## 2015-12-14 DIAGNOSIS — M6281 Muscle weakness (generalized): Secondary | ICD-10-CM | POA: Diagnosis not present

## 2015-12-14 DIAGNOSIS — R262 Difficulty in walking, not elsewhere classified: Secondary | ICD-10-CM | POA: Diagnosis not present

## 2015-12-17 DIAGNOSIS — M6281 Muscle weakness (generalized): Secondary | ICD-10-CM | POA: Diagnosis not present

## 2015-12-17 DIAGNOSIS — R262 Difficulty in walking, not elsewhere classified: Secondary | ICD-10-CM | POA: Diagnosis not present

## 2015-12-17 DIAGNOSIS — R269 Unspecified abnormalities of gait and mobility: Secondary | ICD-10-CM | POA: Diagnosis not present

## 2015-12-17 DIAGNOSIS — G35 Multiple sclerosis: Secondary | ICD-10-CM | POA: Diagnosis not present

## 2015-12-19 ENCOUNTER — Telehealth: Payer: Self-pay | Admitting: *Deleted

## 2015-12-19 DIAGNOSIS — R269 Unspecified abnormalities of gait and mobility: Secondary | ICD-10-CM | POA: Diagnosis not present

## 2015-12-19 DIAGNOSIS — M6281 Muscle weakness (generalized): Secondary | ICD-10-CM | POA: Diagnosis not present

## 2015-12-19 DIAGNOSIS — R262 Difficulty in walking, not elsewhere classified: Secondary | ICD-10-CM | POA: Diagnosis not present

## 2015-12-19 DIAGNOSIS — G35 Multiple sclerosis: Secondary | ICD-10-CM | POA: Diagnosis not present

## 2015-12-19 NOTE — Telephone Encounter (Signed)
[name]  Labs collected 12/05/15:  WBC: 3.3 HGB: 13.9 Neutrophils: 92 Lymphs: 3 Lymphs (Absolute): 0.1

## 2015-12-20 ENCOUNTER — Ambulatory Visit: Payer: Self-pay | Admitting: Neurology

## 2015-12-20 DIAGNOSIS — M6281 Muscle weakness (generalized): Secondary | ICD-10-CM | POA: Diagnosis not present

## 2015-12-20 DIAGNOSIS — G35 Multiple sclerosis: Secondary | ICD-10-CM | POA: Diagnosis not present

## 2015-12-20 DIAGNOSIS — R269 Unspecified abnormalities of gait and mobility: Secondary | ICD-10-CM | POA: Diagnosis not present

## 2015-12-20 DIAGNOSIS — R262 Difficulty in walking, not elsewhere classified: Secondary | ICD-10-CM | POA: Diagnosis not present

## 2015-12-25 DIAGNOSIS — R262 Difficulty in walking, not elsewhere classified: Secondary | ICD-10-CM | POA: Diagnosis not present

## 2015-12-25 DIAGNOSIS — M6281 Muscle weakness (generalized): Secondary | ICD-10-CM | POA: Diagnosis not present

## 2015-12-25 DIAGNOSIS — R269 Unspecified abnormalities of gait and mobility: Secondary | ICD-10-CM | POA: Diagnosis not present

## 2015-12-25 DIAGNOSIS — G35 Multiple sclerosis: Secondary | ICD-10-CM | POA: Diagnosis not present

## 2015-12-27 DIAGNOSIS — R262 Difficulty in walking, not elsewhere classified: Secondary | ICD-10-CM | POA: Diagnosis not present

## 2015-12-27 DIAGNOSIS — M6281 Muscle weakness (generalized): Secondary | ICD-10-CM | POA: Diagnosis not present

## 2015-12-27 DIAGNOSIS — G35 Multiple sclerosis: Secondary | ICD-10-CM | POA: Diagnosis not present

## 2015-12-27 DIAGNOSIS — R269 Unspecified abnormalities of gait and mobility: Secondary | ICD-10-CM | POA: Diagnosis not present

## 2015-12-28 ENCOUNTER — Telehealth: Payer: Self-pay | Admitting: Neurology

## 2015-12-28 NOTE — Telephone Encounter (Signed)
Wife called, would like a call back from Dr. Krista Blue. He is feeling a little generally weak. Not urgent, she was thinking maybe next week he may need IV steroids, no new focal deficits or mental status changes, I advised to go to ED for anything concerning. Please call

## 2015-12-31 NOTE — Telephone Encounter (Signed)
Left message for a return call

## 2015-12-31 NOTE — Telephone Encounter (Addendum)
Chart reviewed, he has finished his  1st round of [name] infusion from July 10-14th, he was admitted to the hospital after spiked fever on July 15th, was admitted to [facility] for possible pneumonia. He was treated with antibiotics, he should also take Valtrex 500mg  bid for virus preventions.  Selinda Eon please check on patient, if needed I may see him earlier than previously scheduled

## 2016-01-01 DIAGNOSIS — M6281 Muscle weakness (generalized): Secondary | ICD-10-CM | POA: Diagnosis not present

## 2016-01-01 DIAGNOSIS — R269 Unspecified abnormalities of gait and mobility: Secondary | ICD-10-CM | POA: Diagnosis not present

## 2016-01-01 DIAGNOSIS — G35 Multiple sclerosis: Secondary | ICD-10-CM | POA: Diagnosis not present

## 2016-01-01 DIAGNOSIS — R262 Difficulty in walking, not elsewhere classified: Secondary | ICD-10-CM | POA: Diagnosis not present

## 2016-01-01 NOTE — Telephone Encounter (Signed)
Called again today - spoke to [place].  He does not feel his weakness is worse.  Says he has been trying to exercise by walking in place for ten minutes at a time.  He confirmed that he is taking Valtrex 500mg , BID.  He declined an earlier appt with Dr. Krista Blue but will call back with any concerns.  He will keep his pending follow up appt.

## 2016-01-03 DIAGNOSIS — R262 Difficulty in walking, not elsewhere classified: Secondary | ICD-10-CM | POA: Diagnosis not present

## 2016-01-03 DIAGNOSIS — G35 Multiple sclerosis: Secondary | ICD-10-CM | POA: Diagnosis not present

## 2016-01-03 DIAGNOSIS — R269 Unspecified abnormalities of gait and mobility: Secondary | ICD-10-CM | POA: Diagnosis not present

## 2016-01-03 DIAGNOSIS — M6281 Muscle weakness (generalized): Secondary | ICD-10-CM | POA: Diagnosis not present

## 2016-01-08 ENCOUNTER — Telehealth: Payer: Self-pay | Admitting: Neurology

## 2016-01-08 DIAGNOSIS — M6281 Muscle weakness (generalized): Secondary | ICD-10-CM | POA: Diagnosis not present

## 2016-01-08 DIAGNOSIS — R269 Unspecified abnormalities of gait and mobility: Secondary | ICD-10-CM | POA: Diagnosis not present

## 2016-01-08 DIAGNOSIS — R262 Difficulty in walking, not elsewhere classified: Secondary | ICD-10-CM | POA: Diagnosis not present

## 2016-01-08 DIAGNOSIS — G35 Multiple sclerosis: Secondary | ICD-10-CM | POA: Diagnosis not present

## 2016-01-08 NOTE — Telephone Encounter (Signed)
Pt's wife called in requesting rx for mobility wheel chair.Cell phone - [name] [phone]

## 2016-01-08 NOTE — Telephone Encounter (Signed)
Rx faxed and confirmed to [facility] on 01/02/16 (attention [place]).  Ph: [phone], [phone].

## 2016-01-10 DIAGNOSIS — G35 Multiple sclerosis: Secondary | ICD-10-CM | POA: Diagnosis not present

## 2016-01-10 DIAGNOSIS — R262 Difficulty in walking, not elsewhere classified: Secondary | ICD-10-CM | POA: Diagnosis not present

## 2016-01-10 DIAGNOSIS — M6281 Muscle weakness (generalized): Secondary | ICD-10-CM | POA: Diagnosis not present

## 2016-01-10 DIAGNOSIS — R269 Unspecified abnormalities of gait and mobility: Secondary | ICD-10-CM | POA: Diagnosis not present

## 2016-01-15 DIAGNOSIS — M6281 Muscle weakness (generalized): Secondary | ICD-10-CM | POA: Diagnosis not present

## 2016-01-15 DIAGNOSIS — R269 Unspecified abnormalities of gait and mobility: Secondary | ICD-10-CM | POA: Diagnosis not present

## 2016-01-15 DIAGNOSIS — R262 Difficulty in walking, not elsewhere classified: Secondary | ICD-10-CM | POA: Diagnosis not present

## 2016-01-15 DIAGNOSIS — G35 Multiple sclerosis: Secondary | ICD-10-CM | POA: Diagnosis not present

## 2016-01-23 ENCOUNTER — Telehealth: Payer: Self-pay | Admitting: *Deleted

## 2016-01-23 NOTE — Telephone Encounter (Signed)
Collected by [facility] on 01/02/16:  CBC w/ DIFF/PLATELET: Platelets: 385 Lymphs (Absolute): 0.4  UA: normal  Creatinine, Serum: 0.96 - normal range

## 2016-01-25 ENCOUNTER — Other Ambulatory Visit: Payer: Self-pay | Admitting: Neurology

## 2016-02-04 ENCOUNTER — Encounter: Payer: Self-pay | Admitting: Neurology

## 2016-02-04 ENCOUNTER — Ambulatory Visit (INDEPENDENT_AMBULATORY_CARE_PROVIDER_SITE_OTHER): Payer: PRIVATE HEALTH INSURANCE | Admitting: Neurology

## 2016-02-04 VITALS — BP 133/84 | HR 110 | Ht 71.0 in | Wt 193.0 lb

## 2016-02-04 DIAGNOSIS — G35 Multiple sclerosis: Secondary | ICD-10-CM

## 2016-02-04 DIAGNOSIS — R350 Frequency of micturition: Secondary | ICD-10-CM | POA: Diagnosis not present

## 2016-02-04 DIAGNOSIS — R269 Unspecified abnormalities of gait and mobility: Secondary | ICD-10-CM | POA: Diagnosis not present

## 2016-02-04 DIAGNOSIS — R531 Weakness: Secondary | ICD-10-CM | POA: Diagnosis not present

## 2016-02-04 NOTE — Progress Notes (Signed)
Chief Complaint  Patient presents with  . Multiple Sclerosis    He is here with his wife, Lawrence [facility].  He had his first year of [name] from July 10th - 17th.  Reports worsening of his blurred vision.  He is also still having problems with both gait and balance.  He is using a cane for assistance.      PATIENT: Lawrence Lamb DOB: May 21, 1970  Chief Complaint  Patient presents with  . Multiple Sclerosis    He is here with his wife, Lawrence [facility].  He had his first year of [name] from July 10th - 17th.  Reports worsening of his blurred vision.  He is also still having problems with both gait and balance.  He is using a cane for assistance.    HISTORICAL  History of Present Illness:  Lawrence Lamb is a 45 years old right-handed African American male, follow-up for relapsing remediating multiple sclerosis  He moved from [place] to [place] in 2013, was previously under the care of [place] [facility] specialist Lawrence [facility],  He was diagnosed with relapsing remitting multiple sclerosis in early 2007, presenting with right face, right leg weakness, has mild gait difficulty, diagnosis was confirmed by marked abnormal MRI of brain, and cervical lesions   He was initially treated with Betaseron for 18 months, clinically he was stable, but there was enhancing MRI lesions on Betaseron, he developed positive interferon antibodies.  Tysabri was started since February 2009, initially every 4 weeks, later stretching to every 6 weeks, JC virus antibody was negative in September 2009 and April 2010, but JC virus antibody began to be positive since September 2010, he tolerates the [name] infusion very well from Feb 2009 till Jan 2013. He has repeat MRI of brain every 6 months.    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  He was treated with [name]  from January 24th, 2014 for 6 months, but he continued to have slow progressive worsening gait difficulty, fell multiple times, had few rounds of steroid.  Abnormal visual evoked response test secondary to prolongation of the P100 waveform on the left.    He now complains of constant gait difficulty, bilateral lower extremity spasticity, also complains of urinary incontinence, double vision, he could no longer driving,  also complains of worsening memory trouble, difficulty focusing, could not take care of his young children by himself.     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, index value is 4.15 in June 2014, 2.66 in 11/22/2013   I have discussed treatment options with patient and his wife, he had progressive worsening gait difficulty, memory trouble, vision problem since he was off [name], taking [name] treatment,  His symptoms were relatively stable while taking Lawrence Lamb is 10/998 chance of PML,  He restarted on [name] infusion  since Oct 14th 2014   UPDATE Sept 2015 During followup visit, he continued to complain progressive worsening gait difficulty, spasticity of lower extremity muscles, clumsiness of bilateral hands, fatigue, memory trouble, difficulty focusing, double vision, he is no longer driving, walking with assistance, difficulty keeping his young children at home.   We have reviewed MRI of the brain with without contrast August 2015, extensive supratentorium, and posterior fossa lesions, no significant progression compared to previous scans in March 2015,  MRI cervical with without contrast also demonstrated prolonged segments of lesions involving midcervical cord, no contrast enhancement  UPDATE Dec 10th 2015: He was evaluated by Lawrence Lamb Lawrence Lamb  January 10 2014, who agreed on continue Dana treatment, patient likely enter into the degenerative stages of his MS, but he continued to complain gradual decline, Lawrence Lamb still is reasonable choice, He is forgetful, waking  up at evening, he complains of double vision. He has tried Ampyra 10 mg bid since 2015, did not have significant side effect, which did help his walking  UPDATE Feb 11th 2016: His walking has much improved with Ampyra twice a day, no significant side effect, he did not had MRI of brain, cervical spine as previously scheduled,    UPDATE October 10 2014: He was admitted to [facility] March 28-30 2016 for flulike illness, he had much worsening gait difficulty ever since, now rely on his forefoot cane, he also complains of worsening nocturia, wake up multiple times during night, depressed he could not contribute much to his family, he is still waiting for social disability applications.   Update August the second 2016: He continue complains of gait difficulty, right worse than left, rely on his cane, Effexor has been helpful for his depression, complains of worsening memory trouble Anti-[name] antibody was negative. He and his wife concerned about the side effect of lemtrada, wants to stay on Tysabri.  I have reviewed and compared MRI scan in June 2016 at [facility] to February 2016 MRIs, multiple supratentorium lesions, T1 black holes, no contrast enhancement, no significant change compared to previous scans,  UPDATE Feb 21 2015: He started to feel increaesd weakness since Nov 1st, right leg worse than left, he took a shower, his leg gave out, fell down, difficult to get out of bathtub, he also has chronic constipations   UPDATE May 09 2015: He was treated with one course of Medrol pack, his walking has improved some, but overall has slow progress in gait difficulty, worsening memory trouble, he can no longer babysit his young children at home, mild depression, Effexor has made his depression worse, worsening constipations, failed to improve by over-the-counter medications  UPDATE August 13 2015:   Last [facility] infusion was in July 09 2015, he was evaluated by Dr. Felecia Shelling in March 20 eighth  2017, he is at high risk to develop complication with continued [name] infusion, in addition, he continue have progressive worsening gait difficulty on [name], frequent flareups, we decided to proceed with [name]: He had first dose of lemtrada from July 10-14th, tolerate it well.  I have reviewed [facility] admission on November 04 2015 to [facility],  Patient woke up November 03 2015 was found to be confused, had a fever of 102, he just finished Lematrada infusion on July 14th 2017, upon presentation, blood pressure was 119/54, heart rate of 79, temperature 99.2, patient was documented as low alert, no skin rash, blood culture was sent, he was treated with Levaquin, next  CT of the chest without contrast showed minimum bilateral dependent subsegmental atelectasis, no acute abnormality, MRI of the brain with and without contrast on November 05 2015 showed advanced chronic demyelinating disease throughout the brain, unchanged from January 2017, no new lesion  X-rays was negative for pneumonia,  EKG showed sinus rhythm with heart rate of 97 Laboratory evaluation, glucose 94, creatinine 0.91, and Was mildly elevated 13 point 9, CPK was 282, WBC was 7.8, hemoglobin was 13 point 5, neutrophil was 6 5.6%, influenza a antibody was positive, UA showed negative leukoesterase, negative nitrate,  He was  treated with antibiotics, IV solution, his symptoms quickly improved over 4 days, he is now at his baseline, ambulate with a walker, he does not drive, his sister stays with him during the day, he watches TV,  He complains of worsening visual problem, he was seen by optometrist, he could see close vision, but could not read far away.   REVIEW OF SYSTEMS: Full 14 system review of systems performed and notable only for double vision, frequent awakening, walking difficulty, memory loss, depression hallucinations.  ALLERGIES: No Known Allergies  HOME MEDICATIONS: Current  Outpatient Prescriptions  Medication Sig Dispense Refill  . Alemtuzumab ([name]) 12 MG/1.2ML SOLN Inject into the vein.    . Alemtuzumab ([name]) 12 MG/1.2ML SOLN Inject into the vein.    . baclofen (LIORESAL) 10 MG tablet TAKE ONE TABLET BY MOUTH THREE TIMES DAILY 90 tablet 11  . Cholecalciferol (VITAMIN D PO) Take by mouth 2 (two) times daily.    Marland Kitchen docusate sodium (COLACE) 100 MG capsule Take 1 capsule (100 mg total) by mouth 2 (two) times daily. 60 capsule 11  . DULoxetine (CYMBALTA) 60 MG capsule Take 1 capsule (60 mg total) by mouth daily. 30 capsule 12  . loratadine (CLARITIN) 10 MG tablet Take 10 mg by mouth daily.    . methylPREDNISolone (MEDROL DOSEPAK) 4 MG TBPK tablet Take as instructed. 21 tablet 0  . multivitamin-iron-minerals-folic acid (CENTRUM) chewable tablet Chew 1 tablet by mouth daily.    Marland Kitchen omega-3 acid ethyl esters (LOVAZA) 1 G capsule Take 1,000 mg by mouth 2 (two) times daily.    Marland Kitchen oxybutynin (DITROPAN) 5 MG tablet Take 1 tablet (5 mg total) by mouth every 8 (eight) hours as needed for bladder spasms. 90 tablet 11  . valACYclovir (VALTREX) 500 MG tablet Take 1 tablet (500 mg total) by mouth 2 (two) times daily. 60 tablet 4  . zolpidem (AMBIEN) 5 MG tablet TAKE ONE TABLET BY MOUTH AT BEDTIME AS NEEDED FOR SLEEP 30 tablet 3   No current facility-administered medications for this visit.     PAST MEDICAL HISTORY: Past Medical History:  Diagnosis Date  . Multiple sclerosis ([place]) 2007    PAST SURGICAL HISTORY: Past Surgical History:  Procedure Laterality Date  . APPENDECTOMY  46 years old  . VASECTOMY  08/2013    FAMILY HISTORY: Family History  Problem Relation Age of Onset  . High blood pressure Mother   . High blood pressure Father     SOCIAL HISTORY:  Social History   Social History  . Marital status: Married    Spouse name: Eida  . Number of children: 2  . Years of education: 12   Occupational History  .      Disabled   Social History Main  Topics  . Smoking status: Never Smoker  . Smokeless tobacco: Never Used  . Alcohol use No  . Drug use: No  . Sexual activity: Not on file   Other Topics Concern  . Not on file   Social History Narrative   Patient lives at home with his wife Lawrence Lamb). Patient has two children. Patient is disabled.   Right handed.   Caffeine- one cup daily.     PHYSICAL EXAM   Vitals:   02/04/16 1155  BP: 133/84  Pulse: (!) 110  Weight: 193 lb (87.5 kg)  Height: 5\' 11"  (1.803 m)    Not recorded      Body mass index is 26.92 kg/m.  PHYSICAL EXAMNIATION:  Gen: NAD, conversant,  well nourised, obese, well groomed                     Cardiovascular: Regular rate rhythm, no peripheral edema, warm, nontender. Eyes: Conjunctivae clear without exudates or hemorrhage Neck: Supple, no carotid bruise. Pulmonary: Clear to auscultation bilaterally   NEUROLOGICAL EXAM:  MENTAL STATUS: Speech:    Speech is normal; fluent and spontaneous with normal comprehension.  Cognition:    The patient is oriented to person, place, and time;     CRANIAL NERVES: CN II: Visual fields are full to confrontation, pupils were equal round reactive to light, visual acuity left 20/70, right 20/100 CN III, IV, VI: extraocular movement are normal. End gaze horizontal nystagmus, No ptosis. CN V: Facial sensation is intact to pinprick in all 3 divisions bilaterally. Corneal responses are intact.  CN VII: Face is symmetric with normal eye closure and smile. CN VIII: Hearing is normal to rubbing fingers CN IX, X: Palate elevates symmetrically. Phonation is normal. CN XI: Head turning and shoulder shrug are intact CN XII: Tongue is midline with normal movements and no atrophy.  MOTOR: Moderate spasticity of bilateral lower extremity, right worse than right, bilateral hip flexion 4, ankle dorsiflexion4/5, mild right upper extremity fixation on rapid rotating movement  REFLEXES: Hyperreflexia on both side. Plantar  responses are flexor.  SENSORY: Length dependent decreased light touch, vibratory sensation to knee level   COORDINATION: Mild bilateral finger-to-nose dysmetria, mild to moderate bilateral heel-to-shin dysmetria  GAIT/STANCE: He need to push up to get up from seated position, wide-based, cautious, unsteady gait, needs assistance  DIAGNOSTIC DATA (LABS, IMAGING, TESTING) - I reviewed patient records, labs, notes, testing and imaging myself where available.  Jc virus positive, index 4.15 in July 2014, 2.66 in August 2015. Anti-Tysabri antibody: negative Feb 2016.  MRI brain w/wo in June 2016 at [facility] (canopy): Stable multiple supratentorium MS lesions, no contrast enhancement, no significant change compared to previous scan in February 2016.  Treatment:   Lawrence Lamb February 2009 till January 2013, Last Lawrence Lamb infusion July 09 2015.  Melchor Amour, Jan 2014 to Oct 2014.  Restart Tysabri since Oct 2014 till July 09 2015  [name] 1st dose July 10-14th 2017.      ASSESSMENT AND PLAN  Booker Zimmerli is a 45 y.o. male   Relapsing and meeting multiple sclerosis,  [name] July 10-14th 2017  Famciclovir 500mg  tid  Repeat MRi brain w/wo in June 2017,    Urinary incontinence: Insomnia; Ambien as needed Gait difficulty   Moderate exercise.   Memory loss Depression:   Effexor has made depression worse, I started Cymbalta 60 mg a day Gradual worsening blurry vision  Especially far vision, that is not corrected by glasses, this is most likely due to his nystagmus, optic neuritis  Chronic constipation  Colace 100 mg twice a day   Marcial Pacas, M.D. Ph.D.  [facility] [place], [place] [address] Phone: [phone] Fax:      [phone]      Marcial Pacas, M.D. Ph.D.  [facility] [address] [place], [place] [address] Ph: [phone] Fax: [phone]

## 2016-02-05 ENCOUNTER — Telehealth: Payer: Self-pay | Admitting: *Deleted

## 2016-02-05 NOTE — Telephone Encounter (Signed)
Collected by [facility] on 02/04/16:  HELPER T-LYMPH-CD4: Absolute CD 4 Helper: 74 % CD 4 Pos. Lymph.: 12.3  CBC w/ DIFF/PLATELET: Platelets: 289 Lymphs (Absolute): 0.6  UA: normal  Creatinine, Serum: 0.86 - normal range

## 2016-03-10 ENCOUNTER — Other Ambulatory Visit: Payer: Self-pay | Admitting: Neurology

## 2016-03-11 ENCOUNTER — Telehealth: Payer: Self-pay | Admitting: Neurology

## 2016-03-11 NOTE — Telephone Encounter (Signed)
Pt's wife called requesting a letter stating due to pts confusion pt needs help with his finances , to look over them, to help with passwords and such.She will need the letter for social security. Please call and advise [phone]

## 2016-03-11 NOTE — Telephone Encounter (Signed)
Spoke to his wife, [name] (on HIPAA) - she is aware that his letter will be ready for pick up on Monday, 11/27 when Dr. Krista Blue returns and is able to sign it.  She will come by to pick it up then.

## 2016-03-12 ENCOUNTER — Encounter: Payer: Self-pay | Admitting: *Deleted

## 2016-03-20 ENCOUNTER — Encounter: Payer: Self-pay | Admitting: Neurology

## 2016-04-03 ENCOUNTER — Encounter: Payer: Self-pay | Admitting: Neurology

## 2016-04-08 ENCOUNTER — Telehealth: Payer: Self-pay | Admitting: Neurology

## 2016-04-08 NOTE — Telephone Encounter (Signed)
Spoke to [employer] - says patient has been experiencing gradual, worsening of his blurred vision in his bilateral eyes.  She is going to make him an appt for an eye exam.  Dr. Krista Blue is aware of symptoms.

## 2016-04-08 NOTE — Telephone Encounter (Signed)
Pt's wife called says he is complaining that his vision is blurry, he can't focus. She is wanting to know if he should see optometrist or ophthalmologist

## 2016-04-16 ENCOUNTER — Telehealth: Payer: Self-pay | Admitting: Neurology

## 2016-04-16 NOTE — Telephone Encounter (Signed)
Spoke to patient's wife, Lawrence Lamb - states he is having increased weakness, slower gait, difficulty in speech, extreme dry mouth and more frequent urination.  States they were in [place] for Christmas but have been back for several days.  Says he was not in the heat and did not overexert himself during the trip.  Per Dr. Krista Blue, work-in with NP.

## 2016-04-16 NOTE — Telephone Encounter (Signed)
Pt's wife called said he is having slurred speech, dry mouth,difficulty walking x 2 day. She said they flew to [facility] over the holidays and not sure if this could have caused exacerbation

## 2016-04-17 ENCOUNTER — Encounter: Payer: Self-pay | Admitting: Nurse Practitioner

## 2016-04-17 ENCOUNTER — Ambulatory Visit (INDEPENDENT_AMBULATORY_CARE_PROVIDER_SITE_OTHER): Payer: PRIVATE HEALTH INSURANCE | Admitting: Nurse Practitioner

## 2016-04-17 VITALS — BP 117/69 | HR 86 | Ht 71.0 in | Wt 201.4 lb

## 2016-04-17 DIAGNOSIS — G35 Multiple sclerosis: Secondary | ICD-10-CM | POA: Diagnosis not present

## 2016-04-17 DIAGNOSIS — R350 Frequency of micturition: Secondary | ICD-10-CM

## 2016-04-17 DIAGNOSIS — R269 Unspecified abnormalities of gait and mobility: Secondary | ICD-10-CM

## 2016-04-17 DIAGNOSIS — F329 Major depressive disorder, single episode, unspecified: Secondary | ICD-10-CM

## 2016-04-17 DIAGNOSIS — F32A Depression, unspecified: Secondary | ICD-10-CM

## 2016-04-17 NOTE — Progress Notes (Signed)
[facility]  PATIENT: Lawrence Lamb DOB: [birth year]   REASON FOR VISIT: Follow-up for multiple sclerosis HISTORY FROM: Patient and wife    HISTORY OF PRESENT ILLNESS: Lawrence Lamb is a 45 years old right-handed African American male, follow-up for relapsing remediating multiple sclerosis  He moved from [place] to [place] in 2013, was previously under the care of [place] [facility] specialist Dr. [name],  He was diagnosed with relapsing remitting multiple sclerosis in early 2007, presenting with right face, right leg weakness, has mild gait difficulty, diagnosis was confirmed by marked abnormal MRI of brain, and cervical lesions   He was initially treated with Betaseron for 18 months, clinically he was stable, but there was enhancing MRI lesions on Betaseron, he developed positive interferon antibodies.  Tysabri was started since February 2009, initially every 4 weeks, later stretching to every 6 weeks, JC virus antibody was negative in September 2009 and April 2010, but JC virus antibody began to be positive since September 2010, he tolerates the [name] infusion very well from Feb 2009 till Jan 2013. He has repeat MRI of brain every 6 months.    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 He was treated with [name] from January 24th, 2014 for 6 months, but he continued to have slow progressive worsening gait difficulty, fell multiple times, had few rounds of steroid. Abnormal visual evoked response test secondary to prolongation of the P100 waveform on the left.    He now complains of constant gait difficulty, bilateral lower extremity spasticity, also complains of urinary incontinence, double vision, he could no longer driving,  also complains of worsening memory trouble, difficulty focusing, could  not take care of his young children by himself.     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, index value is 4.15 in June 2014, 2.66 in 11/22/2013   I have discussed treatment options with patient and his wife, he had progressive worsening gait difficulty, memory trouble, vision problem since he was off [name], taking [name] treatment,  His symptoms were relatively stable while taking [name] is 10/998 chance of PML, He restarted on [name] infusion  since Oct 14th 2014  UPDATE Feb 11th 2016:YY His walking has much improved with Ampyra twice a day, no significant side effect, he did not had MRI of brain, cervical spine as previously scheduled,    UPDATE October 10 2014:YY He was admitted to hospital March 28-30 2016 for flulike illness, he had much worsening gait difficulty ever since, now rely on his forefoot cane, he also complains of worsening nocturia, wake up multiple times during night, depressed he could not contribute much to his family, he is still waiting for social disability applications.   Update August the second 2016:YY He continue complains of gait difficulty, right worse than left, rely on his cane, Effexor has been helpful for his depression, complains of worsening memory trouble Anti-[name] antibody was negative. He and his wife concerned about the side effect of lemtrada, wants to stay on Tysabri.  I have reviewed and compared MRI scan in June 2016 at [facility] to February 2016 MRIs, multiple supratentorium lesions, T1 black holes, no contrast enhancement, no significant change compared to previous scans,  UPDATE Feb 21 2015:YY He started to feel increaesd weakness since Nov 1st, right leg worse than left, he took a shower, his leg gave out, fell down,  difficult to get out of bathtub, he also has chronic constipations   UPDATE May 09 2015:YY He was treated with one course of Medrol pack, his walking has improved some, but overall has slow progress in gait difficulty,  worsening memory trouble, he can no longer babysit his young children at home, mild depression, Effexor has made his depression worse, worsening constipations, failed to improve by over-the-counter medications  UPDATE August 13 2015:[name] [name] infusion was in July 09 2015, he was evaluated by Dr. Felecia Shelling in March 20 eighth 2017, he is at high risk to develop complication with continued [name] infusion, in addition, he continue have progressive worsening gait difficulty on [name], frequent flareups, we decided to proceed with [name]:YY He had first dose of lemtrada from July 10-14th, tolerate it well. I have reviewed hospital admission on November 04 2015 to [facility],  Patient woke up November 03 2015 was found to be confused, had a fever of 102, he just finished Lematrada infusion on July 14th 2017, upon presentation, blood pressure was 119/54, heart rate of 79, temperature 99.2, patient was documented as low alert, no skin rash, blood culture was sent, he was treated with Levaquin, next  CT of the chest without contrast showed minimum bilateral dependent subsegmental atelectasis, no acute abnormality, MRI of the brain with and without contrast on November 05 2015 showed advanced chronic demyelinating disease throughout the brain, unchanged from January 2017, no new lesion  X-rays was negative for pneumonia,  EKG showed sinus rhythm with heart rate of 97 Laboratory evaluation, glucose 94, creatinine 0.91, and Was mildly elevated 13 point 9, CPK was 282, WBC was 7.8, hemoglobin was 13 point 5, neutrophil was 6 5.6%, influenza a antibody was positive, UA showed negative leukoesterase, negative nitrate,  He was treated with antibiotics, IV solution, his symptoms quickly improved over 4 days, he is now at his baseline, ambulate with a walker, he does not drive, his sister stays with him during the day, he watches TV,  He complains of worsening visual problem,  he was seen by optometrist, he could see close vision, but could not read far away.  UPDATE 12/28/2017CM Lawrence Lamb, 45 year old male returns for follow-up with long history of multiple sclerosis. He had his first  lemtrada infusion from July 10-14th, tolerate it well. Patient went to [place] over the Christmas holiday and denies doing too much. Since his return he feels he has increased weakness and difficulty with gait extreme dry mouth and constipation. He has been on oxybutynin for urinary incontinence for quite some time. Unfortunately he does not take his Colace as directed. He ambulates with a quad cane but his recent fall occurred when he was not using the cane. Ms. recent MRI of the brain 09/14/2015 was Abnormal MRI scan of the brain showing scattered diffuse and confluent periventricular, subcortical and brainstem and cerebellar white matter hyperintensities compatible with chronic multiple sclerosis. No enhancing lesions are noted. Prominence of the ventricles and cerebral atrophy indicates chronic disease. Overall no significant change compared with previous MRI scan dated 06/16/2014. He returns for reevaluation. He has previously been seen by Dr. Krista Blue in this office records reviewed   REVIEW OF SYSTEMS: Full 14 system review of systems performed and notable only for those listed, all others are neg:  Constitutional: Fatigue Cardiovascular: neg Ear/Nose/Throat: neg  Skin: neg Eyes: Blurred vision Respiratory: neg Gastroitestinal: Constipation, urinary incontinence Hematology/Lymphatic: neg  Endocrine: neg Musculoskeletal:neg Allergy/Immunology: neg Neurological: Memory loss  Psychiatric: neg Sleep : neg   ALLERGIES: No Known Allergies  HOME MEDICATIONS: Outpatient Medications Prior to Visit  Medication Sig Dispense Refill  . Alemtuzumab ([name]) 12 MG/1.2ML SOLN Inject into the vein.    . baclofen (LIORESAL) 10 MG tablet TAKE ONE TABLET BY MOUTH THREE TIMES DAILY 270 tablet  2  . Cholecalciferol (VITAMIN D PO) Take by mouth 2 (two) times daily. Takes 2000units daily    . DULoxetine (CYMBALTA) 60 MG capsule Take 1 capsule (60 mg total) by mouth daily. 30 capsule 12  . loratadine (CLARITIN) 10 MG tablet Take 10 mg by mouth daily.    . multivitamin-iron-minerals-folic acid (CENTRUM) chewable tablet Chew 1 tablet by mouth daily.    Marland Kitchen omega-3 acid ethyl esters (LOVAZA) 1 G capsule Take 1,000 mg by mouth 2 (two) times daily.    Marland Kitchen oxybutynin (DITROPAN) 5 MG tablet Take 1 tablet (5 mg total) by mouth every 8 (eight) hours as needed for bladder spasms. 90 tablet 11  . valACYclovir (VALTREX) 500 MG tablet Take 1 tablet (500 mg total) by mouth 2 (two) times daily. 60 tablet 4  . zolpidem (AMBIEN) 5 MG tablet TAKE ONE TABLET BY MOUTH AT BEDTIME AS NEEDED FOR SLEEP 30 tablet 3  . Alemtuzumab ([name]) 12 MG/1.2ML SOLN Inject into the vein.    Marland Kitchen docusate sodium (COLACE) 100 MG capsule Take 1 capsule (100 mg total) by mouth 2 (two) times daily. 60 capsule 11  . methylPREDNISolone (MEDROL DOSEPAK) 4 MG TBPK tablet Take as instructed. 21 tablet 0   No facility-administered medications prior to visit.     PAST MEDICAL HISTORY: Past Medical History:  Diagnosis Date  . Multiple sclerosis ([place]) 2007    PAST SURGICAL HISTORY: Past Surgical History:  Procedure Laterality Date  . APPENDECTOMY  45 years old  . VASECTOMY  08/2013    FAMILY HISTORY: Family History  Problem Relation Age of Onset  . High blood pressure Mother   . High blood pressure Father     SOCIAL HISTORY: Social History   Social History  . Marital status: Married    Spouse name: Eida  . Number of children: 2  . Years of education: 12   Occupational History  .      Disabled   Social History Main Topics  . Smoking status: Never Smoker  . Smokeless tobacco: Never Used  . Alcohol use No  . Drug use: No  . Sexual activity: Not on file   Other Topics Concern  . Not on file   Social History  Narrative   Patient lives at home with his wife [name]). Patient has two children. Patient is disabled.   Right handed.   Caffeine- one cup daily.     PHYSICAL EXAM  Vitals:   04/17/16 1330  BP: 117/69  Pulse: 86  Weight: 201 lb 6.4 oz (91.4 kg)  Height: 5\' 11"  (1.803 m)   Body mass index is 28.09 kg/m.  Generalized: Well developed, Obese male in no acute distress  Head: normocephalic and atraumatic,. Oropharynx benign  Neck: Supple, no carotid bruits  Cardiac: Regular rate rhythm, no murmur  Musculoskeletal: No deformity   Neurological examination   Mentation: Alert oriented to time, place, history taking. Attention span and concentration appropriate. Recent and remote memory intact.  Follows all commands speech and language fluent.   Cranial nerve II-XII: Visual acuity 20/200 right 20/100 left with correction .Pupils were equal round reactive to light extraocular movements were full, end gaze  horizontal nystagmus no ptosis Visual field were full on confrontational test. Facial sensation and strength were normal. hearing was intact to finger rubbing bilaterally. Uvula tongue midline. head turning and shoulder shrug were normal and symmetric.Tongue protrusion into cheek strength was normal. Motor: normal bulk and tone, full strength in the BUE, moderate spasticity of bilateral lower extremity right worse than left bilateral hip flexion 4 out of 5 right  ankle dorsiflexion 4 out of 5 on the right Sensory: Length dependent decreased light touch and vibratory sensation to knee level Coordination: finger-nose-finger, mild dysmetria heel-to-shin bilaterally, moderate  dysmetria Reflexes: Hyperreflexia, plantar responses were flexor bilaterally. Gait and Station: Rising up from seated position without assistance, wide based cautious unsteady gait ambulates with quad cane  DIAGNOSTIC DATA (LABS, IMAGING, TESTING) - I reviewed patient records, labs, notes, testing and imaging myself  where available.  Lab Results  Component Value Date   WBC 8.2 10/15/2015   HGB 12.4 (L) 07/19/2014   HCT 42.4 10/15/2015   MCV 90 10/15/2015   PLT 347 10/15/2015      Component Value Date/Time   NA 140 10/15/2015 1630   K 4.8 10/15/2015 1630   CL 101 10/15/2015 1630   CO2 25 10/15/2015 1630   GLUCOSE 91 10/15/2015 1630   GLUCOSE 98 07/19/2014 0456   BUN 12 10/15/2015 1630   CREATININE 1.14 10/15/2015 1630   CALCIUM 9.4 10/15/2015 1630   PROT 6.9 10/15/2015 1630   ALBUMIN 4.4 10/15/2015 1630   AST 18 10/15/2015 1630   ALT 23 10/15/2015 1630   ALKPHOS 40 10/15/2015 1630   BILITOT 0.4 10/15/2015 1630   GFRNONAA 77 10/15/2015 1630   GFRAA 89 10/15/2015 1630     Lab Results  Component Value Date   TSH 0.605 10/15/2015      ASSESSMENT AND PLAN  45 y.o. year old male  has a past medical history of Multiple sclerosis ([place]) (2007). here with To follow-up for Relapsing and meeting multiple sclerosis, received [name] July 10-14th 2017 Famciclovir 500mg  tid Repeat MRi brain w/wo in May 2017, MRI of the brain 09/14/2015 was Abnormal MRI scan of the brain showing scattered diffuse and confluent periventricular, subcortical and brainstem and cerebellar white matter hyperintensities compatible with chronic multiple sclerosis. No enhancing lesions are noted. Prominence of the ventricles and cerebral atrophy indicates chronic disease. Overall no significant change compared with previous MRI scan dated 06/16/2014.    PLAN: For gait difficulty moderate exercise, use cane at all times Continue Cymbalta for depression Colace 100 mg twice daily for constipation Make sure you increase your fluid intake to 8-10 glasses daily Continue Ditropan for urinary frequency Follow-up with Dr.  Krista Blue February 8 at [facility] than 50% of time during this 45 minute visit was spent on counseling,explanation and education of diagnosis, planning of further management, discussion with patient and  coordination of care and reviewing previous medical record Lawrence Lamb, [facility], [facility], [facility] [address], [place], [place] [address] [phone]

## 2016-04-17 NOTE — Progress Notes (Signed)
I have read the note, and I agree with the clinical assessment and plan.  Sheniya Garciaperez KEITH   

## 2016-04-17 NOTE — Patient Instructions (Addendum)
For gait difficulty moderate exercise, use cane at all times Continue Cymbalta for depression Colace 100 mg twice daily for constipation Make sure you increase your fluid intake to 8-10 glasses daily Continue Ditropan for urinary incontinence Follow-up with Dr.  Krista Blue February 8 at [facility]

## 2016-04-20 ENCOUNTER — Other Ambulatory Visit: Payer: Self-pay | Admitting: Neurology

## 2016-04-28 ENCOUNTER — Other Ambulatory Visit: Payer: Self-pay | Admitting: Neurology

## 2016-05-06 ENCOUNTER — Encounter: Payer: Self-pay | Admitting: Neurology

## 2016-05-09 ENCOUNTER — Telehealth: Payer: Self-pay | Admitting: *Deleted

## 2016-05-09 NOTE — Telephone Encounter (Signed)
Collected by [facility] on 04/03/16:  HELPER T-LYMPH-CD4: Absolute CD 4 Helper: 97 % CD 4 Pos. Lymph.: 13.9  CBC w/ DIFF/PLATELET: wnl  UA: normal  Creatinine, Serum: 0.96 - normal range

## 2016-05-18 ENCOUNTER — Other Ambulatory Visit: Payer: Self-pay | Admitting: Neurology

## 2016-05-22 ENCOUNTER — Ambulatory Visit: Payer: Medicare Other | Admitting: Nurse Practitioner

## 2016-05-29 ENCOUNTER — Ambulatory Visit: Payer: Self-pay | Admitting: Neurology

## 2016-05-30 ENCOUNTER — Encounter: Payer: Self-pay | Admitting: Neurology

## 2016-06-03 ENCOUNTER — Encounter: Payer: Self-pay | Admitting: Neurology

## 2016-06-05 ENCOUNTER — Telehealth: Payer: Self-pay | Admitting: *Deleted

## 2016-06-05 NOTE — Telephone Encounter (Signed)
Collected by [facility] on 06/03/16:  HELPER T-LYMPH-CD4: Absolute CD 4 Helper: 141 % CD 4 Pos. Lymph.: 15.7  CBC w/ DIFF/PLATELET: wnl  UA: normal  Creatinine, Serum: 1.00- normal range

## 2016-06-10 ENCOUNTER — Ambulatory Visit: Payer: Medicare Other | Admitting: Nurse Practitioner

## 2016-06-24 DIAGNOSIS — R197 Diarrhea, unspecified: Secondary | ICD-10-CM | POA: Diagnosis not present

## 2016-07-01 ENCOUNTER — Encounter: Payer: Self-pay | Admitting: Neurology

## 2016-07-03 ENCOUNTER — Telehealth: Payer: Self-pay | Admitting: *Deleted

## 2016-07-03 NOTE — Telephone Encounter (Signed)
Collected by [facility] on 07/01/16:  HELPER T-LYMPH-CD4: Absolute CD 4 Helper: 197 % CD 4 Pos. Lymph.: 21.9  CBC w/ DIFF/PLATELET: Eos (Absolute) 0.5 High All other values wnl  UA: normal  Creatinine, Serum: 0.96- wnl  TSH: 1.010 - wnl

## 2016-07-24 ENCOUNTER — Telehealth: Payer: Self-pay | Admitting: *Deleted

## 2016-07-24 NOTE — Telephone Encounter (Signed)
Collected by [facility] on 07/23/16:  HELPER T-LYMPH-CD4: Absolute CD 4 Helper: 175 % CD 4 Pos. Lymph.: 19.4  CBC w/ DIFF/PLATELET: wnl  UA: normal  Creatinine, Serum: 0.97- wnl  TSH: 0.956 - wnl

## 2016-08-24 ENCOUNTER — Other Ambulatory Visit: Payer: Self-pay | Admitting: Neurology

## 2016-09-07 ENCOUNTER — Other Ambulatory Visit: Payer: Self-pay | Admitting: Neurology

## 2016-09-08 ENCOUNTER — Telehealth: Payer: Self-pay | Admitting: *Deleted

## 2016-09-08 NOTE — Telephone Encounter (Signed)
Collected by [facility] on 09/02/16:  HELPER T-LYMPH-CD4: Absolute CD 4 Helper: 209 % CD 4 Pos. Lymph.: 20.9  CBC w/ DIFF/PLATELET: wnl  UA: normal  Creatinine, Serum: 0.92- wnl  TSH: 0.592 - wnl

## 2016-09-30 ENCOUNTER — Encounter: Payer: Self-pay | Admitting: Neurology

## 2016-10-02 ENCOUNTER — Telehealth: Payer: Self-pay | Admitting: Neurology

## 2016-10-02 ENCOUNTER — Ambulatory Visit (INDEPENDENT_AMBULATORY_CARE_PROVIDER_SITE_OTHER): Payer: Medicare Other | Admitting: Neurology

## 2016-10-02 ENCOUNTER — Encounter: Payer: Self-pay | Admitting: Neurology

## 2016-10-02 VITALS — BP 123/84 | HR 76 | Ht 71.0 in | Wt 194.5 lb

## 2016-10-02 DIAGNOSIS — G35 Multiple sclerosis: Secondary | ICD-10-CM | POA: Diagnosis not present

## 2016-10-02 DIAGNOSIS — R350 Frequency of micturition: Secondary | ICD-10-CM

## 2016-10-02 DIAGNOSIS — R269 Unspecified abnormalities of gait and mobility: Secondary | ICD-10-CM | POA: Diagnosis not present

## 2016-10-02 MED ORDER — LACTULOSE 10 G PO PACK: 10.0000 g | PACK | Freq: Three times a day (TID) | ORAL | 6 refills | Status: DC

## 2016-10-02 MED ORDER — VALACYCLOVIR HCL 500 MG PO TABS: 500.0000 mg | ORAL_TABLET | Freq: Two times a day (BID) | ORAL | 11 refills | Status: DC

## 2016-10-02 NOTE — Telephone Encounter (Addendum)
He is due for his second him try to infusion in July 2018  Last infusion was on July 10-14 2017, he developed fever, generalized weakness 2 days post infusion require hospitalization in 2017, I have talked with Dr. Felecia Shelling, please remind interfusion to give him extra fluid [phone] mL of saline at each infusioin  Please give him a follow-up appointment shortly after this infusion,

## 2016-10-02 NOTE — Progress Notes (Signed)
[facility]  PATIENT: Lawrence Lamb DOB: [birth year]   REASON FOR VISIT: Follow-up for multiple sclerosis HISTORY FROM: Patient and wife    HISTORY OF PRESENT ILLNESS: Mr. Bathe is a 46 years old right-handed African American male, follow-up for relapsing remediating multiple sclerosis  He moved from [place] to [place] in 2013, was previously under the care of [place] [facility] specialist Dr. [name],  He was diagnosed with relapsing remitting multiple sclerosis in early 2007, presenting with right face, right leg weakness, has mild gait difficulty, diagnosis was confirmed by marked abnormal MRI of brain, and cervical lesions   He was initially treated with Betaseron for 18 months, clinically he was stable, but there was enhancing MRI lesions on Betaseron, he developed positive interferon antibodies.  Tysabri was started since February 2009, initially every 4 weeks, later stretching to every 6 weeks, JC virus antibody was negative in September 2009 and April 2010, but JC virus antibody began to be positive since September 2010, he tolerates the [name] infusion very well from Feb 2009 till Jan 2013. He has repeat MRI of brain every 6 months.    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 He was treated with [name] from January 24th, 2014 for 6 months, but he continued to have slow progressive worsening gait difficulty, fell multiple times, had few rounds of steroid. Abnormal visual evoked response test secondary to prolongation of the P100 waveform on the left.    He now complains of constant gait difficulty, bilateral lower extremity spasticity, also complains of urinary incontinence, double vision, he could no longer driving,  also complains of worsening memory trouble, difficulty focusing, could  not take care of his young children by himself.     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, index value is 4.15 in June 2014, 2.66 in 11/22/2013   I have discussed treatment options with patient and his wife, he had progressive worsening gait difficulty, memory trouble, vision problem since he was off [name], taking [name] treatment,  His symptoms were relatively stable while taking [name] is 10/998 chance of PML, He restarted on [name] infusion  since Oct 14th 2014  UPDATE Feb 11th 2016:YY His walking has much improved with Ampyra twice a day, no significant side effect, he did not had MRI of brain, cervical spine as previously scheduled,    UPDATE October 10 2014:YY He was admitted to hospital March 28-30 2016 for flulike illness, he had much worsening gait difficulty ever since, now rely on his forefoot cane, he also complains of worsening nocturia, wake up multiple times during night, depressed he could not contribute much to his family, he is still waiting for social disability applications.   Update August the second 2016:YY He continue complains of gait difficulty, right worse than left, rely on his cane, Effexor has been helpful for his depression, complains of worsening memory trouble Anti-[name] antibody was negative. He and his wife concerned about the side effect of lemtrada, wants to stay on Tysabri.  I have reviewed and compared MRI scan in June 2016 at [facility] to February 2016 MRIs, multiple supratentorium lesions, T1 black holes, no contrast enhancement, no significant change compared to previous scans,  UPDATE Feb 21 2015:YY He started to feel increaesd weakness since Nov 1st, right leg worse than left, he took a shower, his leg gave out, fell down,  difficult to get out of bathtub, he also has chronic constipations   UPDATE May 09 2015:YY He was treated with one course of Medrol pack, his walking has improved some, but overall has slow progress in gait difficulty,  worsening memory trouble, he can no longer babysit his young children at home, mild depression, Effexor has made his depression worse, worsening constipations, failed to improve by over-the-counter medications  UPDATE August 13 2015:[name] [name] infusion was in July 09 2015, he was evaluated by Dr. Felecia Shelling in March 20 eighth 2017, he is at high risk to develop complication with continued [name] infusion, in addition, he continue have progressive worsening gait difficulty on [name], frequent flareups, we decided to proceed with [name]:YY He had first dose of lemtrada from July 10-14th, tolerate it well. I have reviewed hospital admission on November 04 2015 to [facility],  Patient woke up November 03 2015 was found to be confused, had a fever of 102, he just finished Lematrada infusion on July 14th 2017, upon presentation, blood pressure was 119/54, heart rate of 79, temperature 99.2, patient was documented as low alert, no skin rash, blood culture was sent, he was treated with Levaquin, next  CT of the chest without contrast showed minimum bilateral dependent subsegmental atelectasis, no acute abnormality, MRI of the brain with and without contrast on November 05 2015 showed advanced chronic demyelinating disease throughout the brain, unchanged from January 2017, no new lesion  X-rays was negative for pneumonia,  EKG showed sinus rhythm with heart rate of 97 Laboratory evaluation, glucose 94, creatinine 0.91, and Was mildly elevated 13 point 9, CPK was 282, WBC was 7.8, hemoglobin was 13 point 5, neutrophil was 6 5.6%, influenza a antibody was positive, UA showed negative leukoesterase, negative nitrate,  He was treated with antibiotics, IV solution, his symptoms quickly improved over 4 days, he is now at his baseline, ambulate with a walker, he does not drive, his sister stays with him during the day, he watches TV,  He complains of worsening visual problem,  he was seen by optometrist, he could see close vision, but could not read far away.   UPDATE October 02 2016: He denies any significant change, he ambulate with a cane, he continue have significant memory loss, denies significant depression, mild fatigue, he attend adult day program.  He continues to have mild to moderate constipation, bladder urgency.  He will have repeat Lematrada infusion in July 2018, 2 days following previous infusion on November 03 2016, he was admitted to the hospital for fever, symptoms quickly improved with antibiotic treatment, but UA showed negative leukoesterase, negative nitrate, CT chest showed minimum bilateral dependent actelesis, no acute abnormality  He has monthly lab by [facility]etr[facility] at the middle of month.  REVIEW OF SYSTEMS: Full 14 system review of systems performed and notable only for those listed, all others are neg:     ALLERGIES: No Known Allergies  HOME MEDICATIONS: Outpatient Medications Prior to Visit  Medication Sig Dispense Refill  . Alemtuzumab ([name]) 12 MG/1.2ML SOLN Inject into the vein.    . baclofen (LIORESAL) 10 MG tablet TAKE ONE TABLET BY MOUTH THREE TIMES DAILY 270 tablet 2  . Cholecalciferol (VITAMIN D PO) Take by mouth 2 (two) times daily. Takes 2000units daily    . DULoxetine (CYMBALTA) 60 MG capsule TAKE ONE CAPSULE BY MOUTH ONCE DAILY 90 capsule 3  . loratadine (CLARITIN) 10 MG tablet Take 10 mg by mouth daily.    Marland Kitchen  multivitamin-iron-minerals-folic acid (CENTRUM) chewable tablet Chew 1 tablet by mouth daily.    Marland Kitchen omega-3 acid ethyl esters (LOVAZA) 1 G capsule Take 1,000 mg by mouth 2 (two) times daily.    Marland Kitchen oxybutynin (DITROPAN) 5 MG tablet TAKE ONE TABLET BY MOUTH EVERY 8 HOURS AS NEEDED FOR BLADDER SPASMS 90 tablet 11  . valACYclovir (VALTREX) 500 MG tablet TAKE ONE CAPLET BY MOUTH TWICE DAILY 60 tablet 3  . zolpidem (AMBIEN) 5 MG tablet TAKE ONE TABLET BY MOUTH AT BEDTIME AS NEEDED FOR SLEEP 30 tablet 3  . psyllium (REGULOID)  0.52 g capsule Take 0.52 g by mouth 2 (two) times daily.     No facility-administered medications prior to visit.     PAST MEDICAL HISTORY: Past Medical History:  Diagnosis Date  . Multiple sclerosis ([place]) 2007    PAST SURGICAL HISTORY: Past Surgical History:  Procedure Laterality Date  . APPENDECTOMY  46 years old  . VASECTOMY  08/2013    FAMILY HISTORY: Family History  Problem Relation Age of Onset  . High blood pressure Mother   . High blood pressure Father     SOCIAL HISTORY: Social History   Social History  . Marital status: Married    Spouse name: Eida  . Number of children: 2  . Years of education: 12   Occupational History  .      Disabled   Social History Main Topics  . Smoking status: Never Smoker  . Smokeless tobacco: Never Used  . Alcohol use No  . Drug use: No  . Sexual activity: Not on file   Other Topics Concern  . Not on file   Social History Narrative   Patient lives at home with his wife [name]). Patient has two children. Patient is disabled.   Right handed.   Caffeine- one cup daily.     PHYSICAL EXAM  Vitals:   10/02/16 0920  BP: 123/84  Pulse: 76  Weight: 194 lb 8 oz (88.2 kg)  Height: 5\' 11"  (1.803 m)   Body mass index is 27.13 kg/m.  Generalized: Well developed, Obese male in no acute distress  Head: normocephalic and atraumatic,. Oropharynx benign  Neck: Supple, no carotid bruits  Cardiac: Regular rate rhythm, no murmur  Musculoskeletal: No deformity   Neurological examination   Mentation: Alert oriented to time, place, history taking. Attention span and concentration appropriate. Recent and remote memory intact.  Follows all commands speech and language fluent.   Cranial nerve II-XII: Visual acuity 20/200 right 20/100 left with correction .Pupils were equal round reactive to light extraocular movements were full, end gaze  horizontal nystagmus no ptosis Visual field were full on confrontational test. Facial sensation  and strength were normal. hearing was intact to finger rubbing bilaterally. Uvula tongue midline. head turning and shoulder shrug were normal and symmetric.Tongue protrusion into cheek strength was normal. Motor: normal bulk and tone, full strength in the BUE, moderate spasticity of bilateral lower extremity right worse than left bilateral hip flexion 4 out of 5 right  ankle dorsiflexion 4 out of 5 on the right Sensory: Length dependent decreased light touch and vibratory sensation to knee level Coordination: finger-nose-finger, mild dysmetria heel-to-shin bilaterally, moderate  dysmetria Reflexes: Hyperreflexia, plantar responses were flexor bilaterally. Gait and Station: Rising up from seated position without assistance, wide based cautious unsteady gait ambulates with quad cane  DIAGNOSTIC DATA (LABS, IMAGING, TESTING) - I reviewed patient records, labs, notes, testing and imaging myself where available.  Lab Results  Component Value Date   WBC 8.2 10/15/2015   HGB 14.1 10/15/2015   HCT 42.4 10/15/2015   MCV 90 10/15/2015   PLT 347 10/15/2015      Component Value Date/Time   NA 140 10/15/2015 1630   K 4.8 10/15/2015 1630   CL 101 10/15/2015 1630   CO2 25 10/15/2015 1630   GLUCOSE 91 10/15/2015 1630   GLUCOSE 98 07/19/2014 0456   BUN 12 10/15/2015 1630   CREATININE 1.14 10/15/2015 1630   CALCIUM 9.4 10/15/2015 1630   PROT 6.9 10/15/2015 1630   ALBUMIN 4.4 10/15/2015 1630   AST 18 10/15/2015 1630   ALT 23 10/15/2015 1630   ALKPHOS 40 10/15/2015 1630   BILITOT 0.4 10/15/2015 1630   GFRNONAA 77 10/15/2015 1630   GFRAA 89 10/15/2015 1630     Lab Results  Component Value Date   TSH 0.605 10/15/2015      ASSESSMENT AND PLAN  46 y.o. year old male  has a past medical history of Multiple sclerosis ([place]) (2007). here with To follow-up for Relapsing and meeting multiple sclerosis, received [name] July 10-14th 2017 Famciclovir 500mg  tid Repeat MRi brain w/wo in May 2017,  MRI of the brain 09/14/2015 was Abnormal MRI scan of the brain showing scattered diffuse and confluent periventricular, subcortical and brainstem and cerebellar white matter hyperintensities compatible with chronic multiple sclerosis. No enhancing lesions are noted. Prominence of the ventricles and cerebral atrophy indicates chronic disease. Overall no significant change compared with previous MRI scan dated 06/16/2014.    PLAN: For gait difficulty moderate exercise, use cane at all times Continue Cymbalta for depression Colace 100 mg twice daily for constipation Make sure you increase your fluid intake to 8-10 glasses daily Continue Ditropan for urinary frequency Follow-up with Dr.  Krista Blue February 8 at [facility] than 50% of time during this 45 minute visit was spent on counseling,explanation and education of diagnosis, planning of further management, discussion with patient and coordination of care and reviewing previous medical record Dennie Bible, [facility], [facility], [facility] [address] [place], [place] [id number] [phone]

## 2016-10-03 ENCOUNTER — Other Ambulatory Visit: Payer: Self-pay

## 2016-10-03 ENCOUNTER — Telehealth: Payer: Self-pay | Admitting: Neurology

## 2016-10-03 DIAGNOSIS — H532 Diplopia: Secondary | ICD-10-CM | POA: Diagnosis not present

## 2016-10-03 DIAGNOSIS — Z9181 History of falling: Secondary | ICD-10-CM | POA: Diagnosis not present

## 2016-10-03 DIAGNOSIS — G35 Multiple sclerosis: Secondary | ICD-10-CM | POA: Diagnosis not present

## 2016-10-03 DIAGNOSIS — F329 Major depressive disorder, single episode, unspecified: Secondary | ICD-10-CM | POA: Diagnosis not present

## 2016-10-03 DIAGNOSIS — N3281 Overactive bladder: Secondary | ICD-10-CM | POA: Diagnosis not present

## 2016-10-03 MED ORDER — LACTULOSE 10 GM/15ML PO SOLN: 10.0000 g | Freq: Three times a day (TID) | ORAL | 5 refills | Status: DC

## 2016-10-03 NOTE — Telephone Encounter (Signed)
Amy from [facility] calling for verbal orders  For pt she can be reached at [phone]

## 2016-10-03 NOTE — Telephone Encounter (Signed)
New rx e-scribed for liquid instead of packets per request from pharmacy in order for med to be covered by pt's insurance.

## 2016-10-03 NOTE — Telephone Encounter (Signed)
LMVM for  Amy that was returning her call.  Will need to touch base on Monday as office closed now.

## 2016-10-06 NOTE — Telephone Encounter (Signed)
His second year [name] is in process of insurance approval.  This case has been pending since May 2018 and [facility] will expedite the request.  Also, she has noted the increase in saline with each infusion.  They will give me appt dates and he will be given a follow up shortly after his infusion is complete.

## 2016-10-06 NOTE — Telephone Encounter (Signed)
[facility] with rehab suggestions

## 2016-10-06 NOTE — Telephone Encounter (Signed)
Noted  

## 2016-10-06 NOTE — Telephone Encounter (Signed)
Spoke to Amy at [facility], relating to orders from PT evaluation.  Recommending visits 2 times week for 3 wks, then one time for one week.  (coordination, gait, strengthening, establish home exercise program).  I gave her verbal ok, if any change to this let me know.

## 2016-10-07 DIAGNOSIS — Z9181 History of falling: Secondary | ICD-10-CM | POA: Diagnosis not present

## 2016-10-07 DIAGNOSIS — H532 Diplopia: Secondary | ICD-10-CM | POA: Diagnosis not present

## 2016-10-07 DIAGNOSIS — F329 Major depressive disorder, single episode, unspecified: Secondary | ICD-10-CM | POA: Diagnosis not present

## 2016-10-07 DIAGNOSIS — G35 Multiple sclerosis: Secondary | ICD-10-CM | POA: Diagnosis not present

## 2016-10-07 DIAGNOSIS — N3281 Overactive bladder: Secondary | ICD-10-CM | POA: Diagnosis not present

## 2016-10-09 DIAGNOSIS — N3281 Overactive bladder: Secondary | ICD-10-CM | POA: Diagnosis not present

## 2016-10-09 DIAGNOSIS — G35 Multiple sclerosis: Secondary | ICD-10-CM | POA: Diagnosis not present

## 2016-10-09 DIAGNOSIS — F329 Major depressive disorder, single episode, unspecified: Secondary | ICD-10-CM | POA: Diagnosis not present

## 2016-10-09 DIAGNOSIS — H532 Diplopia: Secondary | ICD-10-CM | POA: Diagnosis not present

## 2016-10-09 DIAGNOSIS — Z9181 History of falling: Secondary | ICD-10-CM | POA: Diagnosis not present

## 2016-10-15 DIAGNOSIS — Z9181 History of falling: Secondary | ICD-10-CM | POA: Diagnosis not present

## 2016-10-15 DIAGNOSIS — F329 Major depressive disorder, single episode, unspecified: Secondary | ICD-10-CM | POA: Diagnosis not present

## 2016-10-15 DIAGNOSIS — N3281 Overactive bladder: Secondary | ICD-10-CM | POA: Diagnosis not present

## 2016-10-15 DIAGNOSIS — H532 Diplopia: Secondary | ICD-10-CM | POA: Diagnosis not present

## 2016-10-15 DIAGNOSIS — G35 Multiple sclerosis: Secondary | ICD-10-CM | POA: Diagnosis not present

## 2016-10-17 DIAGNOSIS — G35 Multiple sclerosis: Secondary | ICD-10-CM | POA: Diagnosis not present

## 2016-10-17 DIAGNOSIS — F329 Major depressive disorder, single episode, unspecified: Secondary | ICD-10-CM | POA: Diagnosis not present

## 2016-10-17 DIAGNOSIS — H532 Diplopia: Secondary | ICD-10-CM | POA: Diagnosis not present

## 2016-10-17 DIAGNOSIS — N3281 Overactive bladder: Secondary | ICD-10-CM | POA: Diagnosis not present

## 2016-10-17 DIAGNOSIS — Z9181 History of falling: Secondary | ICD-10-CM | POA: Diagnosis not present

## 2016-10-21 DIAGNOSIS — F329 Major depressive disorder, single episode, unspecified: Secondary | ICD-10-CM | POA: Diagnosis not present

## 2016-10-21 DIAGNOSIS — H532 Diplopia: Secondary | ICD-10-CM | POA: Diagnosis not present

## 2016-10-21 DIAGNOSIS — G35 Multiple sclerosis: Secondary | ICD-10-CM | POA: Diagnosis not present

## 2016-10-21 DIAGNOSIS — N3281 Overactive bladder: Secondary | ICD-10-CM | POA: Diagnosis not present

## 2016-10-21 DIAGNOSIS — Z9181 History of falling: Secondary | ICD-10-CM | POA: Diagnosis not present

## 2016-10-23 ENCOUNTER — Telehealth: Payer: Self-pay | Admitting: *Deleted

## 2016-10-23 DIAGNOSIS — H532 Diplopia: Secondary | ICD-10-CM | POA: Diagnosis not present

## 2016-10-23 DIAGNOSIS — Z6827 Body mass index (BMI) 27.0-27.9, adult: Secondary | ICD-10-CM | POA: Diagnosis not present

## 2016-10-23 DIAGNOSIS — R2689 Other abnormalities of gait and mobility: Secondary | ICD-10-CM | POA: Diagnosis not present

## 2016-10-23 DIAGNOSIS — Z0000 Encounter for general adult medical examination without abnormal findings: Secondary | ICD-10-CM | POA: Diagnosis not present

## 2016-10-23 DIAGNOSIS — N3281 Overactive bladder: Secondary | ICD-10-CM | POA: Diagnosis not present

## 2016-10-23 DIAGNOSIS — F329 Major depressive disorder, single episode, unspecified: Secondary | ICD-10-CM | POA: Diagnosis not present

## 2016-10-23 DIAGNOSIS — G35 Multiple sclerosis: Secondary | ICD-10-CM | POA: Diagnosis not present

## 2016-10-23 DIAGNOSIS — Z111 Encounter for screening for respiratory tuberculosis: Secondary | ICD-10-CM | POA: Diagnosis not present

## 2016-10-23 DIAGNOSIS — Z9181 History of falling: Secondary | ICD-10-CM | POA: Diagnosis not present

## 2016-10-23 NOTE — Telephone Encounter (Signed)
Collected by [facility] on 09/30/16:  HELPER T-LYMPH-CD4: Absolute CD 4 Helper: 237 (L) % CD 4 Pos. Lymph.: 21.5 (L)  CBC w/ DIFF/PLATELET: wnl  UA: normal  Creatinine, Serum: 0.97- wnl  TSH: 0.771- wnl

## 2016-10-31 DIAGNOSIS — H532 Diplopia: Secondary | ICD-10-CM | POA: Diagnosis not present

## 2016-10-31 DIAGNOSIS — G35 Multiple sclerosis: Secondary | ICD-10-CM | POA: Diagnosis not present

## 2016-10-31 DIAGNOSIS — N3281 Overactive bladder: Secondary | ICD-10-CM | POA: Diagnosis not present

## 2016-10-31 DIAGNOSIS — F329 Major depressive disorder, single episode, unspecified: Secondary | ICD-10-CM | POA: Diagnosis not present

## 2016-10-31 DIAGNOSIS — Z9181 History of falling: Secondary | ICD-10-CM | POA: Diagnosis not present

## 2016-11-10 ENCOUNTER — Telehealth: Payer: Self-pay | Admitting: *Deleted

## 2016-11-10 NOTE — Telephone Encounter (Signed)
Collected by [facility] on 11/03/16:  HELPER T-LYMPH-CD4: Absolute CD 4 Helper: 185 (L) % CD 4 Pos. Lymph.: 20.5 (L)  CBC w/ DIFF/PLATELET: wnl  UA: normal  Creatinine, Serum: 1.10- wnl  TSH: 0.501- wnl

## 2016-11-13 ENCOUNTER — Telehealth: Payer: Self-pay | Admitting: Neurology

## 2016-11-13 NOTE — Telephone Encounter (Signed)
Pt said he was contacted by [employer], he has been approved for Alemtuzumab ([name]) 12 MG/1.2ML SOLN .   FYI

## 2016-11-13 NOTE — Telephone Encounter (Signed)
Provided this information to [facility].  They are working with patient to schedule his second year [name] infusion.

## 2016-11-26 NOTE — Telephone Encounter (Signed)
Pt base line lab form needs to be updated to have pt address show [address] [place], [place] [address]. This Is needed ASAP. Pt status form due tomorrow 11-27-2016  Make sure it is complete. Section with question is pt still under care of Dr Krista Blue please check yes. If address is asked for it has to all be his [address] [place], [place] [address] .  If there are questions rep can be reached  Until 6pm [place] Standard. Pt status form due by tomorrow if not received in time will be a deviation.

## 2016-11-26 NOTE — Telephone Encounter (Signed)
Form filled out, to be signed by Dr. Krista Blue.

## 2016-11-27 ENCOUNTER — Telehealth: Payer: Self-pay | Admitting: Neurology

## 2016-11-27 NOTE — Telephone Encounter (Signed)
Patient developed fever after 5 days of [name] infusion in July 2017, require hospital admission, no infectious etiology found, please remind me to talk with [facility] nurse give him higher dose of steroid during this round of infusion

## 2016-11-27 NOTE — Telephone Encounter (Signed)
Form completed and signed.  Faxed to [facility] with confirmation.  [phone].

## 2016-12-02 ENCOUNTER — Encounter: Payer: Self-pay | Admitting: Neurology

## 2016-12-02 NOTE — Telephone Encounter (Signed)
Patient is schedule for his second year [name] infusion on August 27th, 28th, 29th.  He has a pending appt with Dr. Krista Blue on 12/25/16.   During his first year infusion, he received Solu-Medrol, 1 gram all five days.  Dr. Krista Blue would like him to have Solu-Medrol, 1gram all three days of his second year infusion.  Also, she plans to send him home on an oral, tapering dose of steroids to help prevent adverse side effects.

## 2016-12-03 ENCOUNTER — Telehealth: Payer: Self-pay | Admitting: *Deleted

## 2016-12-03 ENCOUNTER — Ambulatory Visit: Payer: Medicare Other | Admitting: Neurology

## 2016-12-03 NOTE — Telephone Encounter (Signed)
Collected by [facility] on 12/02/16:  HELPER T-LYMPH-CD4: Absolute CD 4 Helper: 212 (L) % CD 4 Pos. Lymph.: 19.3 (L)  CBC w/ DIFF/PLATELET: wnl  UA: normal  Creatinine, Serum: 1.02- wnl  TSH: 0.886- wnl

## 2016-12-15 DIAGNOSIS — G35 Multiple sclerosis: Secondary | ICD-10-CM | POA: Diagnosis not present

## 2016-12-16 DIAGNOSIS — G35 Multiple sclerosis: Secondary | ICD-10-CM | POA: Diagnosis not present

## 2016-12-17 DIAGNOSIS — G35 Multiple sclerosis: Secondary | ICD-10-CM | POA: Diagnosis not present

## 2016-12-25 ENCOUNTER — Ambulatory Visit (INDEPENDENT_AMBULATORY_CARE_PROVIDER_SITE_OTHER): Payer: Medicare Other | Admitting: Neurology

## 2016-12-25 ENCOUNTER — Encounter: Payer: Self-pay | Admitting: Neurology

## 2016-12-25 VITALS — Ht 71.0 in | Wt 190.8 lb

## 2016-12-25 DIAGNOSIS — G35 Multiple sclerosis: Secondary | ICD-10-CM

## 2016-12-25 DIAGNOSIS — R269 Unspecified abnormalities of gait and mobility: Secondary | ICD-10-CM

## 2016-12-25 DIAGNOSIS — F329 Major depressive disorder, single episode, unspecified: Secondary | ICD-10-CM

## 2016-12-25 DIAGNOSIS — F32A Depression, unspecified: Secondary | ICD-10-CM

## 2016-12-25 MED ORDER — DALFAMPRIDINE ER 10 MG PO TB12: 10.0000 mg | ORAL_TABLET | Freq: Two times a day (BID) | ORAL | 11 refills | Status: DC

## 2016-12-25 NOTE — Progress Notes (Signed)
[facility]  PATIENT: Lawrence [facility] DOB: [birth year]   REASON FOR VISIT: Follow-up for multiple sclerosis HISTORY FROM: Patient and wife    HISTORY OF PRESENT ILLNESS: Lawrence Lamb is a 46 years old right-handed African American male, follow-up for relapsing remediating multiple sclerosis  He moved from [place] to [place] in 2013, was previously under the care of [facility],  He was diagnosed with relapsing remitting multiple sclerosis in early 2007, presenting with right face, right leg weakness, has mild gait difficulty, diagnosis was confirmed by marked abnormal MRI of brain, and cervical lesions   He was initially treated with Betaseron for 18 months, clinically he was stable, but there was enhancing MRI lesions on Betaseron, he developed positive interferon antibodies.  Tysabri was started since February 2009, initially every 4 weeks, later stretching to every 6 weeks, JC virus antibody was negative in September 2009 and April 2010, but JC virus antibody began to be positive since September 2010, he tolerates the [facility] infusion very well from Feb 2009 till Jan 2013. He has repeat MRI of brain every 6 months.    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 He was treated with [facility] from January 24th, 2014 for 6 months, but he continued to have slow progressive worsening gait difficulty, fell multiple times, had few rounds of steroid. Abnormal visual evoked response test secondary to prolongation of the P100 waveform on the left.    He now complains of constant gait difficulty, bilateral lower extremity spasticity, also complains of urinary incontinence, double vision, he could no longer driving,  also complains of worsening memory trouble, difficulty focusing, could  not take care of his young children by himself.     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, index value is 4.15 in June 2014, 2.66 in 11/22/2013   I have discussed treatment options with patient and his wife, he had progressive worsening gait difficulty, memory trouble, vision problem since he was off [facility], taking [facility] treatment,  His symptoms were relatively stable while taking [facility] Lamb is 10/998 chance of PML, He restarted on [facility] infusion  since Oct 14th 2014  UPDATE Feb 11th 2016:YY His walking has much improved with [facility] twice a day, no significant side effect, he did not had MRI of brain, cervical spine as previously scheduled,    UPDATE October 10 2014:YY He was admitted to [facility] March 28-30 2016 for flulike illness, he had much worsening gait difficulty ever since, now rely on his forefoot cane, he also complains of worsening nocturia, wake up multiple times during night, depressed he could not contribute much to his family, he is still waiting for social disability applications.   Update August the second 2016:YY He continue complains of gait difficulty, right worse than left, rely on his cane, Effexor has been helpful for his depression, complains of worsening memory trouble Anti-[facility] antibody was negative. He and his wife concerned about the side effect of lemtrada, wants to stay on Tysabri.  I have reviewed and compared MRI scan in June 2016 at [facility] to February 2016 MRIs, multiple supratentorium lesions, T1 black holes, no contrast enhancement, no significant change compared to previous scans,  UPDATE Feb 21 2015:YY He started to feel increaesd weakness since Nov 1st, right leg worse than left, he took a shower, his leg gave out, fell down,  difficult to get out of bathtub, he also has chronic constipations   UPDATE May 09 2015:YY He was treated with one course of Medrol pack, his walking has improved some, but overall has slow progress in gait difficulty,  worsening memory trouble, he can no longer babysit his young children at home, mild depression, Effexor has made his depression worse, worsening constipations, failed to improve by over-the-counter medications  UPDATE August 13 2015:[facility] infusion was in July 09 2015, he was evaluated by [facility] in March 20 eighth 2017, he is at high risk to develop complication with continued [facility] infusion, in addition, he continue have progressive worsening gait difficulty on [facility], frequent flareups, we decided to proceed with [facility] OCt 16th 2017:YY He had first dose of lemtrada from July 10-14th, tolerate it well. I have reviewed [facility] admission on November 04 2015 to [facility],  Patient woke up November 03 2015 was found to be confused, had a fever of 102, he just finished [facility] infusion on July 14th 2017, upon presentation, blood pressure was 119/54, heart rate of 79, temperature 99.2, patient was documented as low alert, no skin rash, blood culture was sent, he was treated with Levaquin, next  CT of the chest without contrast showed minimum bilateral dependent subsegmental atelectasis, no acute abnormality, MRI of the brain with and without contrast on November 05 2015 showed advanced chronic demyelinating disease throughout the brain, unchanged from January 2017, no new lesion  X-rays was negative for pneumonia,  EKG showed sinus rhythm with heart rate of 97 Laboratory evaluation, glucose 94, creatinine 0.91, and Was mildly elevated 13 point 9, CPK was 282, WBC was 7.8, hemoglobin was 13 point 5, neutrophil was 6 5.6%, influenza a antibody was positive, UA showed negative leukoesterase, negative nitrate,  He was treated with antibiotics, IV solution, his symptoms quickly improved over 4 days, he is now at his baseline, ambulate with a walker, he does not drive, his sister stays with him during the day, he watches TV,  He complains of worsening visual problem,  he was seen by optometrist, he could see close vision, but could not read far away.   UPDATE October 02 2016: He denies any significant change, he ambulate with a cane, he continue have significant memory loss, denies significant depression, mild fatigue, he attend adult day program.  He continues to have mild to moderate constipation, bladder urgency.  He will have repeat [facility] infusion in July 2018, 2 days following previous infusion on November 03 2016, he was admitted to the [facility] for fever, symptoms quickly improved with antibiotic treatment, but UA showed negative leukoesterase, negative nitrate, CT chest showed minimum bilateral dependent actelesis, no acute abnormality  He has monthly lab by [facility] at the middle of month.  UPDATE Sept 6th 2018: He tolerates second round of Lemetrada well, finished infusion last week, no fever noted. He has baseline gait abnormality, no significant worsening, also complains of mild depression anxiety insomnia taking melatonin, Ambien as needed   REVIEW OF SYSTEMS: Full 14 system review of systems performed and notable only for those listed, all others are neg: Double vision, memory loss, confusion    ALLERGIES: No Known Allergies  HOME MEDICATIONS: Outpatient Medications Prior to Visit  Medication Sig Dispense Refill  . Alemtuzumab ([name]) 12 MG/1.2ML SOLN Inject into the vein.    . baclofen (LIORESAL) 10 MG tablet TAKE ONE TABLET BY MOUTH THREE TIMES DAILY 270 tablet 2  . Cholecalciferol (VITAMIN D PO)  Take by mouth 2 (two) times daily. Takes 2000units daily    . Docusate Calcium (STOOL SOFTENER PO) Take by mouth 2 (two) times daily as needed.    . DULoxetine (CYMBALTA) 60 MG capsule TAKE ONE CAPSULE BY MOUTH ONCE DAILY 90 capsule 3  . lactulose (CHRONULAC) 10 GM/15ML solution Take 15 mLs (10 g total) by mouth 3 (three) times daily. 473 mL 5  . loratadine (CLARITIN) 10 MG tablet Take 10 mg by mouth daily.    .  multivitamin-iron-minerals-folic acid (CENTRUM) chewable tablet Chew 1 tablet by mouth daily.    Marland Kitchen omega-3 acid ethyl esters (LOVAZA) 1 G capsule Take 1,000 mg by mouth 2 (two) times daily.    Marland Kitchen oxybutynin (DITROPAN) 5 MG tablet TAKE ONE TABLET BY MOUTH EVERY 8 HOURS AS NEEDED FOR BLADDER SPASMS 90 tablet 11  . valACYclovir (VALTREX) 500 MG tablet Take 1 tablet (500 mg total) by mouth 2 (two) times daily. 60 tablet 11  . zolpidem (AMBIEN) 5 MG tablet TAKE ONE TABLET BY MOUTH AT BEDTIME AS NEEDED FOR SLEEP 30 tablet 3   No facility-administered medications prior to visit.     PAST MEDICAL HISTORY: Past Medical History:  Diagnosis Date  . Multiple sclerosis ([place]) 2007    PAST SURGICAL HISTORY: Past Surgical History:  Procedure Laterality Date  . APPENDECTOMY  46 years old  . VASECTOMY  08/2013    FAMILY HISTORY: Family History  Problem Relation Age of Onset  . High blood pressure Mother   . High blood pressure Father     SOCIAL HISTORY: Social History   Social History  . Marital status: Married    Spouse name: Eida  . Number of children: 2  . Years of education: 12   Occupational History  .      Disabled   Social History Main Topics  . Smoking status: Never Smoker  . Smokeless tobacco: Never Used  . Alcohol use No  . Drug use: No  . Sexual activity: Not on file   Other Topics Concern  . Not on file   Social History Narrative   Patient lives at home with his wife [name]). Patient has two children. Patient is disabled.   Right handed.   Caffeine- one cup daily.     PHYSICAL EXAM  Vitals:   12/25/16 0915  Weight: 190 lb 12 oz (86.5 kg)  Height: 5\' 11"  (1.803 m)   Body mass index is 26.6 kg/m.  Generalized: Well developed, Obese male in no acute distress  Head: normocephalic and atraumatic,. Oropharynx benign  Neck: Supple, no carotid bruits  Cardiac: Regular rate rhythm, no murmur  Musculoskeletal: No deformity   Neurological examination    Mentation: Alert oriented to time, place, history taking. Attention span and concentration appropriate. Recent and remote memory intact.  Follows all commands speech and language fluent.   Cranial nerve II-XII: Visual acuity 20/200 right 20/100 left with correction .Pupils were equal round reactive to light extraocular movements were full, end gaze  horizontal nystagmus no ptosis Visual field were full on confrontational test. Facial sensation and strength were normal. hearing was intact to finger rubbing bilaterally. Uvula tongue midline. head turning and shoulder shrug were normal and symmetric.Tongue protrusion into cheek strength was normal. Motor: normal bulk and tone, full strength in the BUE, moderate spasticity of bilateral lower extremity right worse than left bilateral hip flexion 4 out of 5 right  ankle dorsiflexion 4 out of 5 on the right Sensory: Length dependent  decreased light touch and vibratory sensation to knee level Coordination: finger-nose-finger, mild dysmetria heel-to-shin bilaterally, moderate  dysmetria Reflexes: Hyperreflexia, plantar responses were flexor bilaterally. Gait and Station: Rising up from seated position by pushing on chair arm, stiff unsteady gait, right side is worse,  We have performed 25 feet walking, 9,11 DIAGNOSTIC DATA (LABS, IMAGING, TESTING) - I reviewed patient records, labs, notes, testing and imaging myself where available.  Lab Results  Component Value Date   WBC 8.2 10/15/2015   HGB 14.1 10/15/2015   HCT 42.4 10/15/2015   MCV 90 10/15/2015   PLT 347 10/15/2015      Component Value Date/Time   NA 140 10/15/2015 1630   K 4.8 10/15/2015 1630   CL 101 10/15/2015 1630   CO2 25 10/15/2015 1630   GLUCOSE 91 10/15/2015 1630   GLUCOSE 98 07/19/2014 0456   BUN 12 10/15/2015 1630   CREATININE 1.14 10/15/2015 1630   CALCIUM 9.4 10/15/2015 1630   PROT 6.9 10/15/2015 1630   ALBUMIN 4.4 10/15/2015 1630   AST 18 10/15/2015 1630   ALT 23  10/15/2015 1630   ALKPHOS 40 10/15/2015 1630   BILITOT 0.4 10/15/2015 1630   GFRNONAA 77 10/15/2015 1630   GFRAA 89 10/15/2015 1630     Lab Results  Component Value Date   TSH 0.605 10/15/2015      ASSESSMENT AND PLAN  46 y.o. year old male  Relapsing remitting multiple sclerosis  Complete second round of [name] in September 2018.  Continue monthly lab monitoring  MRI brain w/wo  Gait abnormality  Start [facility] 10mg  bid.  Water aerobid Depression  Cymbalta 60mg  daily     Marcial Pacas, M.D. Ph.D.  [facility] [place], [place] [address] Phone: [phone] Fax:      [phone]

## 2016-12-29 ENCOUNTER — Other Ambulatory Visit: Payer: Self-pay | Admitting: Neurology

## 2016-12-30 DIAGNOSIS — L821 Other seborrheic keratosis: Secondary | ICD-10-CM | POA: Diagnosis not present

## 2016-12-30 DIAGNOSIS — D2370 Other benign neoplasm of skin of unspecified lower limb, including hip: Secondary | ICD-10-CM | POA: Diagnosis not present

## 2017-01-05 ENCOUNTER — Encounter: Payer: Self-pay | Admitting: Neurology

## 2017-01-06 ENCOUNTER — Telehealth: Payer: Self-pay | Admitting: *Deleted

## 2017-01-06 DIAGNOSIS — H524 Presbyopia: Secondary | ICD-10-CM | POA: Diagnosis not present

## 2017-01-06 DIAGNOSIS — H469 Unspecified optic neuritis: Secondary | ICD-10-CM | POA: Diagnosis not present

## 2017-01-06 NOTE — Telephone Encounter (Signed)
Collected by [facility] on 01/05/17:  HELPER T-LYMPH-CD4: Absolute CD 4 Helper: 18(L) % CD 4 Pos. Lymph.: 9.2(L)  CBC w/ DIFF/PLATELET:  RBC: 3.90 (L) Hemoglobin: 12.2 (L) Hematocrit: 37.0 (L) Lymphs (Absolute): 0.2 (L)  UA: normal  Creatinine, Serum: 0.92- wnl  TSH: 0.442 (L)

## 2017-01-13 ENCOUNTER — Telehealth: Payer: Self-pay | Admitting: Neurology

## 2017-01-13 NOTE — Telephone Encounter (Signed)
Spoke to Lawrence [facility] - he is aware that he must call [employer] back to provided verbal consent and set up delivery.  I provided the number for him to call.  He will let me know if he has any problems.

## 2017-01-13 NOTE — Telephone Encounter (Signed)
Lawrence [facility] with [facility] presented to the lobby regarding the patient needs to sign Hippa with them [phone]. They have not been able to reach him. They tried 3 times. You can call Vicente Males for more info [phone]

## 2017-03-01 ENCOUNTER — Other Ambulatory Visit: Payer: Self-pay | Admitting: Neurology

## 2017-03-02 ENCOUNTER — Telehealth: Payer: Self-pay

## 2017-03-02 MED ORDER — LACTULOSE 10 GM/15ML PO SOLN: 10.0000 g | Freq: Three times a day (TID) | ORAL | 5 refills | Status: DC

## 2017-03-02 NOTE — Telephone Encounter (Signed)
Refill sent to pharmacy.   

## 2017-03-19 ENCOUNTER — Encounter: Payer: Self-pay | Admitting: Neurology

## 2017-03-23 ENCOUNTER — Telehealth: Payer: Self-pay | Admitting: *Deleted

## 2017-03-23 NOTE — Telephone Encounter (Signed)
Collected by [facility] on 03/19/17:  HELPER T-LYMPH-CD4: Absolute CD 4 Helper: 85(L) % CD 4 Pos. Lymph.: 12.2(L)  CBC w/ DIFF/PLATELET:  WBC: 3.2 (L)  UA: normal  Creatinine, Serum: 0.97- wnl  TSH: 0.761 - wnl  Lab results provided to Dr. Krista Blue for review.

## 2017-03-25 NOTE — Progress Notes (Signed)
[facility]  PATIENT: Demetria Lightsey DOB: 1971/03/28   REASON FOR VISIT: Follow-up for relapsing remitting multiple sclerosis, gait abnormality, depression HISTORY FROM: Patient   HISTORY OF PRESENT ILLNESS:Mr. Booher is a 46 years old right-handed [place] American male, follow-up for relapsing remediating multiple sclerosis  He moved from [place] to [place] in 2013, was previously under the care of [place] [facility] specialist Dr. [name],  He was diagnosed with relapsing remitting multiple sclerosis in early 2007, presenting with right face, right leg weakness, has mild gait difficulty, diagnosis was confirmed by marked abnormal MRI of brain, and cervical lesions   He was initially treated with Betaseron for 18 months, clinically he was stable, but there was enhancing MRI lesions on Betaseron, he developed positive interferon antibodies. Tysabri was started since February 2009, initially every 4 weeks, later stretching to every 6 weeks, JC virus antibody was negative in September 2009 and April 2010, but JC virus antibody began to be positive since September 2010,he tolerates the [name] infusion very well from Feb 2009 till Jan 2013. He has repeat MRI of brain every 6 months.   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 He was treated withTecfidera from January 24th, 2014 for 6 months, but he continued to have slow progressive worsening gait difficulty, fell multiple times, had few rounds of steroid. Abnormal visual evoked response test secondary to prolongation of the P100 waveform on the left.   He nowcomplains of constant gait difficulty, bilateral lower extremity spasticity, also complains of urinary incontinence, double vision, he could no longer driving, also complains of worsening  memory trouble, difficulty focusing, could not take care of his young children by himself.   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,index value is 4.15 in June 2014, 2.66 in 11/22/2013  I have discussed treatment options with patient and his wife, he had progressive worsening gait difficulty, memory trouble, vision problem since he was off [name], taking [name] treatment, His symptoms were relatively stable while taking [name] is 10/998 chance of PML, He restarted on [name] infusion since Oct 14th 2014 UPDATE Feb 11th 2016:YY His walking has much improved with Ampyra twice a day, no significant side effect, he did not had MRI of brain, cervical spine as previously scheduled,   UPDATE October 10 2014:YY He was admitted to hospital March 28-30 2016 for flulike illness, he had much worsening gait difficulty ever since, now rely on his forefoot cane, he also complains of worsening nocturia, wake up multiple times during night, depressed he could not contribute much to his family, he is still waiting for social disability applications.   Update August the second 2016:YY He continue complains of gait difficulty, right worse than left, rely on his cane, Effexor has been helpful for his depression, complains of worsening memory trouble Anti-[name] antibody was negative. He and his wife concerned about the side effect of lemtrada, wants to stay on Tysabri.  I have reviewed and compared MRI scan in June 2016 at [facility] to February 2016 MRIs, multiple supratentorium lesions, T1 black holes, no contrast enhancement, no significant change compared to previous scans,  UPDATE Feb 21 2015:YY He started to feel increaesd weakness since Nov 1st, right leg worse than left, he took a shower, his leg gave out, fell down, difficult to get out of bathtub, he also has chronic constipations   UPDATE  May 09 2015:YY He was treated with one course of Medrol pack, his walking has improved some, but  overall has slow progress in gait difficulty, worsening memory trouble, he can no longer babysit his young children at home, mild depression, Effexor has made his depression worse, worsening constipations, failed to improve by over-the-counter medications  UPDATE August 13 2015:[name] [name] infusion was in July 09 2015, he was evaluated by Dr. Felecia Shelling in March 20 eighth 2017, he is at high risk to develop complication with continued [name] infusion, in addition, he continue have progressive worsening gait difficulty on [name], frequent flareups, we decided to proceed with [name]:YY He had first dose of lemtrada from July 10-14th, tolerate it well. I have reviewed hospital admission on November 04 2015 to [facility],  Patient woke up November 03 2015 was found to be confused, had a fever of 102, he just finished Lematrada infusion on July 14th2017, upon presentation, blood pressure was 119/54, heart rate of 79, temperature 99.2, patient was documented as low alert, no skin rash, blood culture was sent, he was treated with Levaquin, next  CT of the chest without contrast showed minimum bilateral dependent subsegmental atelectasis, no acute abnormality, MRI of the brain with and without contrast on November 05 2015 showed advanced chronic demyelinating disease throughout the brain, unchanged from January 2017, no new lesion  X-rays was negative for pneumonia,  EKG showed sinus rhythm with heart rate of 97 Laboratory evaluation, glucose 94, creatinine 0.91, and Was mildly elevated 13 point 9, CPK was 282, WBC was 7.8, hemoglobin was 13 point 5, neutrophil was 6 5.6%, influenza a antibody was positive, UA showed negative leukoesterase, negative nitrate,  He was treated with antibiotics, IV solution, his symptoms quickly improved over 4 days, he is now at his baseline, ambulate with a walker, he does not drive, his sister stays with him during the day, he watches  TV, He complains of worsening visual problem, he was seen by optometrist, he could see close vision, but could not read far away.   UPDATE October 02 2016:YY He denies any significant change, he ambulate with a cane, he continue have significant memory loss, denies significant depression, mild fatigue, he attend adult day program. He continues to have mild to moderate constipation, bladder urgency.  He will have repeat Lematrada infusion in July 2018, 2 days following previous infusion on November 03 2016, he was admitted to the hospital for fever, symptoms quickly improved with antibiotic treatment, but UA showed negative leukoesterase, negative nitrate, CT chest showed minimum bilateral dependent actelesis, no acute abnormality He has monthly lab by Lemetrada at the middle of month. UPDATE Sept 6th 2018: He tolerates second round of Lemetrada well, finished infusion last week, no fever noted. He has baseline gait abnormality, no significant worsening, also complains of mild depression anxiety insomnia taking melatonin, Ambien as needed  UPDATE 12/6/2018CM Mr. Altier, 46 year old male returns for follow-up with history of relapsing remitting multiple sclerosis he has gait abnormality at baseline and ambulates with a quad cane.  This is not gotten worse.  He has depression and is on Cymbalta.  He is on [place] with monthly labs.  His last infusion was in September.  He did not return phone calls about his [place].  He has not scheduled his MRI of the brain.  He returns for reevaluation REVIEW OF SYSTEMS: Full 14 system review of systems performed and notable only for those listed, all others are neg:  Constitutional: neg  Cardiovascular: neg Ear/Nose/Throat: neg  Skin: neg Eyes: neg Respiratory: neg Gastroitestinal: neg  Hematology/Lymphatic: neg  Endocrine: neg Musculoskeletal: Gait abnormality Allergy/Immunology: neg Neurological: Memory loss Psychiatric: Depression Sleep :  neg   ALLERGIES: No Known Allergies  HOME MEDICATIONS: Outpatient Medications Prior to Visit  Medication Sig Dispense Refill  . Alemtuzumab ([name]) 12 MG/1.2ML SOLN Inject into the vein.    . baclofen (LIORESAL) 10 MG tablet TAKE ONE TABLET BY MOUTH THREE TIMES DAILY 270 tablet 3  . Cholecalciferol (VITAMIN D PO) Take by mouth 2 (two) times daily. Takes 2000units daily    . dalfampridine (AMPYRA) 10 MG TB12 Take 1 tablet (10 mg total) by mouth 2 (two) times daily. 60 tablet 11  . Docusate Calcium (STOOL SOFTENER PO) Take by mouth 2 (two) times daily as needed.    . DULoxetine (CYMBALTA) 60 MG capsule TAKE ONE CAPSULE BY MOUTH ONCE DAILY 90 capsule 3  . lactulose (CHRONULAC) 10 GM/15ML solution Take 15 mLs (10 g total) 3 (three) times daily by mouth. 473 mL 5  . loratadine (CLARITIN) 10 MG tablet Take 10 mg by mouth daily.    . multivitamin-iron-minerals-folic acid (CENTRUM) chewable tablet Chew 1 tablet by mouth daily.    Marland Kitchen omega-3 acid ethyl esters (LOVAZA) 1 G capsule Take 1,000 mg by mouth 2 (two) times daily.    Marland Kitchen oxybutynin (DITROPAN) 5 MG tablet TAKE ONE TABLET BY MOUTH EVERY 8 HOURS AS NEEDED FOR BLADDER SPASMS 90 tablet 11  . valACYclovir (VALTREX) 500 MG tablet Take 1 tablet (500 mg total) by mouth 2 (two) times daily. 60 tablet 11  . zolpidem (AMBIEN) 5 MG tablet TAKE 1 TABLET BY MOUTH AT BEDTIME AS NEEDED FOR SLEEP 30 tablet 5   No facility-administered medications prior to visit.     PAST MEDICAL HISTORY: Past Medical History:  Diagnosis Date  . Multiple sclerosis ([place]) 2007    PAST SURGICAL HISTORY: Past Surgical History:  Procedure Laterality Date  . APPENDECTOMY  46 years old  . VASECTOMY  08/2013    FAMILY HISTORY: Family History  Problem Relation Age of Onset  . High blood pressure Mother   . High blood pressure Father     SOCIAL HISTORY: Social History   Socioeconomic History  . Marital status: Married    Spouse name: Eida  . Number of children:  2  . Years of education: 69  . Highest education level: Not on file  Social Needs  . Financial resource strain: Not on file  . Food insecurity - worry: Not on file  . Food insecurity - inability: Not on file  . Transportation needs - medical: Not on file  . Transportation needs - non-medical: Not on file  Occupational History    Comment: Disabled  Tobacco Use  . Smoking status: Never Smoker  . Smokeless tobacco: Never Used  Substance and Sexual Activity  . Alcohol use: No  . Drug use: No  . Sexual activity: Not on file  Other Topics Concern  . Not on file  Social History Narrative   Patient lives at home with his wife [name]). Patient has two children. Patient is disabled.   Right handed.   Caffeine- one cup daily.     PHYSICAL EXAM  Vitals:   03/26/17 0900  BP: 127/78  Pulse: 100  Weight: 199 lb 9.6 oz (90.5 kg)  Height: 5\' 11"  (1.803 m)   Body mass index is 27.84 kg/m.  Generalized: Well developed, in no  acute distress  Head: normocephalic and atraumatic,. Oropharynx benign  Neck: Supple,  Cardiac: Regular rate rhythm, no murmur  Musculoskeletal: No deformity   Neurological examination   Mentation: Alert oriented to time, place, history taking. Attention span and concentration appropriate. Recent and remote memory intact.  Follows all commands speech and language fluent.   Cranial nerve II-XII: Visual acuity 20/200 right 20/70 left .Pupils were equal round reactive to light extraocular movements were full, and gaze horizontal nystagmus no ptosis visual field were full on confrontational test. Facial sensation and strength were normal. hearing was intact to finger rubbing bilaterally. Uvula tongue midline. head turning and shoulder shrug were normal and symmetric.Tongue protrusion into cheek strength was normal. Motor: normal bulk and tone, full strength in the BUE, moderate spasticity of bilateral lower extremities right worse than left ankle dorsiflexion 4 out of 5  on the right  Sensory: Length dependent decreased vibratory and light touch to knee level.  Coordination: finger-nose-finger, mild dysmetria heel to shin hyperreflexia  Reflexes: Hyperreflexia plantar responses were flexor bilaterally. Gait and Station: Rising up from seated position with push off, stiff unsteady gait with quad cane ambulated 25 feet and 28 seconds   DIAGNOSTIC DATA (LABS, IMAGING, TESTING) - I reviewed patient records, labs, notes, testing and imaging myself where available.  Lab Results  Component Value Date   WBC 8.2 10/15/2015   HGB 14.1 10/15/2015   HCT 42.4 10/15/2015   MCV 90 10/15/2015   PLT 347 10/15/2015      Component Value Date/Time   NA 140 10/15/2015 1630   K 4.8 10/15/2015 1630   CL 101 10/15/2015 1630   CO2 25 10/15/2015 1630   GLUCOSE 91 10/15/2015 1630   GLUCOSE 98 07/19/2014 0456   BUN 12 10/15/2015 1630   CREATININE 1.14 10/15/2015 1630   CALCIUM 9.4 10/15/2015 1630   PROT 6.9 10/15/2015 1630   ALBUMIN 4.4 10/15/2015 1630   AST 18 10/15/2015 1630   ALT 23 10/15/2015 1630   ALKPHOS 40 10/15/2015 1630   BILITOT 0.4 10/15/2015 1630   GFRNONAA 77 10/15/2015 1630   GFRAA 89 10/15/2015 1630      Lab Results  Component Value Date   TSH 0.605 10/15/2015     ASSESSMENT AND PLAN 46 y.o. year old male  with relapsing remitting multiple sclerosis, just completed second round of [name] in September 2018.  Monthly lab monitoring continues.  Gait abnormality and depression   PLAN: Continue Cymbalta 60 mg daily for depression Ampyra 10 mg twice daily form signed walk test completed this is to help with your date MRI of the brain with and without needs to be scheduled patient to call brings for imaging, given the phone number Continue [name] last dose September 2018 Continue monthly lab work Follow-up in 3 months I spent 25 minutes in total face to face time with the patient more than 50% of which was spent counseling and coordination  of care, reviewing test results reviewing medications and discussing and reviewing the diagnosis of multiple sclerosis and his gait abnormality and the reasoning behind ordering Ampyra.Rayburn Ma, [facility], APRN  [facility] [address], [place], [place] [address] [phone]

## 2017-03-26 ENCOUNTER — Telehealth: Payer: Self-pay | Admitting: Neurology

## 2017-03-26 ENCOUNTER — Encounter: Payer: Self-pay | Admitting: Nurse Practitioner

## 2017-03-26 ENCOUNTER — Ambulatory Visit (INDEPENDENT_AMBULATORY_CARE_PROVIDER_SITE_OTHER): Payer: Medicare Other | Admitting: Nurse Practitioner

## 2017-03-26 VITALS — BP 127/78 | HR 100 | Ht 71.0 in | Wt 199.6 lb

## 2017-03-26 DIAGNOSIS — F32A Depression, unspecified: Secondary | ICD-10-CM

## 2017-03-26 DIAGNOSIS — R269 Unspecified abnormalities of gait and mobility: Secondary | ICD-10-CM | POA: Diagnosis not present

## 2017-03-26 DIAGNOSIS — F329 Major depressive disorder, single episode, unspecified: Secondary | ICD-10-CM

## 2017-03-26 DIAGNOSIS — G35 Multiple sclerosis: Secondary | ICD-10-CM

## 2017-03-26 NOTE — Patient Instructions (Signed)
Continue Cymbalta 60 mg daily Ampyra 10 mg twice daily form signed walk test completed MRI of the brain with and without needs to be scheduled Continue [name] last dose September 2018 Continue monthly lab work Follow-up in 3 months

## 2017-03-26 NOTE — Telephone Encounter (Signed)
Patient is schedule to have his MRI Brain done at [facility] on 04/01/17 arrival time is 8;00 AM. I left a voicemail on patient wife phone number and his phone number to inform her of all this information.

## 2017-03-26 NOTE — Progress Notes (Signed)
I have reviewed and agreed above plan. 

## 2017-03-28 DIAGNOSIS — F339 Major depressive disorder, recurrent, unspecified: Secondary | ICD-10-CM | POA: Diagnosis not present

## 2017-03-28 DIAGNOSIS — Z1379 Encounter for other screening for genetic and chromosomal anomalies: Secondary | ICD-10-CM | POA: Diagnosis not present

## 2017-03-29 DIAGNOSIS — Z1509 Genetic susceptibility to other malignant neoplasm: Secondary | ICD-10-CM | POA: Diagnosis not present

## 2017-03-29 DIAGNOSIS — Z1371 Encounter for nonprocreative screening for genetic disease carrier status: Secondary | ICD-10-CM | POA: Diagnosis not present

## 2017-03-29 DIAGNOSIS — Z800 Family history of malignant neoplasm of digestive organs: Secondary | ICD-10-CM | POA: Diagnosis not present

## 2017-03-29 DIAGNOSIS — Z809 Family history of malignant neoplasm, unspecified: Secondary | ICD-10-CM | POA: Diagnosis not present

## 2017-04-01 ENCOUNTER — Encounter: Payer: Self-pay | Admitting: Neurology

## 2017-04-02 ENCOUNTER — Telehealth: Payer: Self-pay | Admitting: *Deleted

## 2017-04-02 NOTE — Telephone Encounter (Signed)
Collected by [facility] on 04/01/17:  HELPER T-LYMPH-CD4: Absolute CD 4 Helper:70(L) % CD 4 Pos. Lymph.:13.9(L) Lymphs: 0.5 (L)  UA:  Specific Gravity >=1.030  Creatinine, Serum:0.93- wnl  TSH:0.719 - wnl  **all other labs wnl**  Lab results provided to Dr. Krista Blue for review.

## 2017-04-03 ENCOUNTER — Encounter: Payer: Self-pay | Admitting: Neurology

## 2017-04-03 DIAGNOSIS — G35 Multiple sclerosis: Secondary | ICD-10-CM | POA: Diagnosis not present

## 2017-04-07 DIAGNOSIS — Z23 Encounter for immunization: Secondary | ICD-10-CM | POA: Diagnosis not present

## 2017-05-07 DIAGNOSIS — H47093 Other disorders of optic nerve, not elsewhere classified, bilateral: Secondary | ICD-10-CM | POA: Diagnosis not present

## 2017-05-07 DIAGNOSIS — G35 Multiple sclerosis: Secondary | ICD-10-CM | POA: Diagnosis not present

## 2017-05-07 DIAGNOSIS — H468 Other optic neuritis: Secondary | ICD-10-CM | POA: Diagnosis not present

## 2017-05-27 ENCOUNTER — Other Ambulatory Visit: Payer: Self-pay | Admitting: Neurology

## 2017-06-12 ENCOUNTER — Telehealth: Payer: Self-pay | Admitting: *Deleted

## 2017-06-12 NOTE — Telephone Encounter (Addendum)
Collected by [facility] on 06/09/17:  HELPER T-LYMPH-CD4: Absolute CD 4 Helper: 146(L) % CD 4 Pos. Lymph.:20.8(L)  UA: normal   Creatinine, Serum: 1.04- wnl  TSH:0.533 - wnl  **all other labs wnl**  Lab results provided to Dr. Krista Blue for review.

## 2017-06-16 DIAGNOSIS — H469 Unspecified optic neuritis: Secondary | ICD-10-CM | POA: Diagnosis not present

## 2017-06-16 DIAGNOSIS — H532 Diplopia: Secondary | ICD-10-CM | POA: Diagnosis not present

## 2017-06-16 DIAGNOSIS — H5213 Myopia, bilateral: Secondary | ICD-10-CM | POA: Diagnosis not present

## 2017-06-24 NOTE — Progress Notes (Signed)
[facility]  PATIENT: Lawrence Lamb DOB: 09/27/1970   REASON FOR VISIT: Follow-up for relapsing remitting multiple sclerosis, gait abnormality, depression HISTORY FROM: Patient and wife Lawrence Lamb ILLNESS:Lawrence Lamb is a 47 years old right-handed [place] American male, follow-up for relapsing remediating multiple sclerosis  He moved from [place] to [place] in 2013, was previously under the care of [place] [facility] specialist Dr. Stephani [facility],  He was diagnosed with relapsing remitting multiple sclerosis in early 2007, presenting with right face, right leg weakness, has mild gait difficulty, diagnosis was confirmed by marked abnormal MRI of brain, and cervical lesions   He was initially treated with Betaseron for 18 months, clinically he was stable, but there was enhancing MRI lesions on Betaseron, he developed positive interferon antibodies. Tysabri was started since February 2009, initially every 4 weeks, later stretching to every 6 weeks, JC virus antibody was negative in September 2009 and April 2010, but JC virus antibody began to be positive since September 2010,he tolerates the [name] infusion very well from Feb 2009 till Jan 2013. He has repeat MRI of brain every 6 months.   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 He was treated withTecfidera from January 24th, 2014 for 6 months, but he continued to have slow progressive worsening gait difficulty, fell multiple times, had few rounds of steroid. Abnormal visual evoked response test secondary to prolongation of the P100 waveform on the left.   He nowcomplains of constant gait difficulty, bilateral lower extremity spasticity, also complains of urinary incontinence, double vision, he could no longer driving, also complains of  worsening memory trouble, difficulty focusing, could not take care of his young children by himself.   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,index value is 4.15 in June 2014, 2.66 in 11/22/2013  I have discussed treatment options with patient and his wife, he had progressive worsening gait difficulty, memory trouble, vision problem since he was off [name], taking [name] treatment, His symptoms were relatively stable while taking Lawrence Lamb is 10/998 chance of PML, He restarted on [name] infusion since Oct 14th 2014 UPDATE Feb 11th 2016:YY His walking has much improved with Ampyra twice a day, no significant side effect, he did not had MRI of brain, cervical spine as previously scheduled,   UPDATE October 10 2014:YY He was admitted to [facility] March 28-30 2016 for flulike illness, he had much worsening gait difficulty ever since, now rely on his forefoot cane, he also complains of worsening nocturia, wake up multiple times during night, depressed he could not contribute much to his family, he is still waiting for social disability applications.   Update August the second 2016:YY He continue complains of gait difficulty, right worse than left, rely on his cane, Effexor has been helpful for his depression, complains of worsening memory trouble Anti-[name] antibody was negative. He and his wife concerned about the side effect of lemtrada, wants to stay on Tysabri.  I have reviewed and compared MRI scan in June 2016 at [facility] to February 2016 MRIs, multiple supratentorium lesions, T1 black holes, no contrast enhancement, no significant change compared to previous scans,  UPDATE Feb 21 2015:YY He started to feel increaesd weakness since Nov 1st, right leg worse than left, he took a shower, his leg gave out, fell down, difficult to get out of bathtub, he also has chronic constipations  UPDATE May 09 2015:YY He was treated with one course of Medrol pack, his walking has improved  some, but overall has slow progress in gait difficulty, worsening memory trouble, he can no longer babysit his young children at home, mild depression, Effexor has made his depression worse, worsening constipations, failed to improve by over-the-counter medications  UPDATE August 13 2015:Lawrence Lamb [name] infusion was in July 09 2015, he was evaluated by Dr. Felecia Lamb in March 20 eighth 2017, he is at high risk to develop complication with continued [name] infusion, in addition, he continue have progressive worsening gait difficulty on [name], frequent flareups, we decided to proceed with [facility] OCt 16th 2017:YY He had first dose of lemtrada from July 10-14th, tolerate it well. I have reviewed [facility] admission on November 04 2015 to [facility],  Patient woke up November 03 2015 was found to be confused, had a fever of 102, he just finished [facility] infusion on July 14th2017, upon presentation, blood pressure was 119/54, heart rate of 79, temperature 99.2, patient was documented as low alert, no skin rash, blood culture was sent, he was treated with Levaquin, next  CT of the chest without contrast showed minimum bilateral dependent subsegmental atelectasis, no acute abnormality, MRI of the brain with and without contrast on November 05 2015 showed advanced chronic demyelinating disease throughout the brain, unchanged from January 2017, no new lesion  X-rays was negative for pneumonia,  EKG showed sinus rhythm with heart rate of 97 Laboratory evaluation, glucose 94, creatinine 0.91, and Was mildly elevated 13 point 9, CPK was 282, WBC was 7.8, hemoglobin was 13 point 5, neutrophil was 6 5.6%, influenza a antibody was positive, UA showed negative leukoesterase, negative nitrate,  He was treated with antibiotics, IV solution, his symptoms quickly improved over 4 days, he is now at his baseline, ambulate with a walker, he does not drive, his sister stays with him during the day, he  watches TV, He complains of worsening visual problem, he was seen by optometrist, he could see close vision, but could not read far away.   UPDATE October 02 2016:YY He denies any significant change, he ambulate with a cane, he continue have significant memory loss, denies significant depression, mild fatigue, he attend adult day program. He continues to have mild to moderate constipation, bladder urgency.  He will have repeat [facility] infusion in July 2018, 2 days following previous infusion on November 03 2016, he was admitted to the [facility] for fever, symptoms quickly improved with antibiotic treatment, but UA showed negative leukoesterase, negative nitrate, CT chest showed minimum bilateral dependent actelesis, no acute abnormality He has monthly lab by Lemetrada at the middle of month. UPDATE Sept 6th 2018: He tolerates second round of Lemetrada well, finished infusion last week, no fever noted. He has baseline gait abnormality, no significant worsening, also complains of mild depression anxiety insomnia taking melatonin, Ambien as needed  UPDATE 12/6/2018CM Lawrence [facility], 47 year old male returns for follow-up with history of relapsing remitting multiple sclerosis he has gait abnormality at baseline and ambulates with a quad cane.  This is not gotten worse.  He has depression and is on Cymbalta.  He is on [place] with monthly labs.  His last infusion was in September.  He did not return phone calls about his [place].  He has not scheduled his MRI of the brain.  He returns for reevaluation UPDATE 3/7/2019CM Lawrence [facility] 47 year old male returns for follow-up with history of relapsing remitting multiple sclerosis gait abnormality at  baseline and depression.  He is on [place] with last infusion in September.  He gets his monthly lab work.  MRI of the brain 04/03/2017 unchanged with severe burden of demyelinating disease without active demyelinating lesions when compared to MRI in 2017.  He has not  followed up with returning phone calls to [place].  He is currently taking Cymbalta for depression with good results.  He ambulates with a quad cane and denies any recent falls.  He is on baclofen for spasticity.  He was recently placed on Zocor 20 mg at the [place]. No new neurologic symptoms he returns for reevaluation REVIEW OF SYSTEMS: Full 14 system review of systems performed and notable only for those listed, all others are neg:  Constitutional: neg  Cardiovascular: neg Ear/Nose/Throat: neg  Skin: neg Eyes: neg Respiratory: neg Gastroitestinal: neg  Hematology/Lymphatic: neg  Endocrine: neg Musculoskeletal: Gait abnormality Allergy/Immunology: neg Neurological: Memory loss Psychiatric: Depression Sleep : neg   ALLERGIES: No Known Allergies  HOME MEDICATIONS: Outpatient Medications Prior to Visit  Medication Sig Dispense Refill  . Alemtuzumab ([name]) 12 MG/1.2ML SOLN Inject into the vein.    . baclofen (LIORESAL) 10 MG tablet TAKE ONE TABLET BY MOUTH THREE TIMES DAILY 270 tablet 3  . Cholecalciferol (VITAMIN D PO) Take by mouth 2 (two) times daily. Takes 2000units daily    . Docusate Calcium (STOOL SOFTENER PO) Take by mouth 2 (two) times daily as needed.    . DULoxetine (CYMBALTA) 60 MG capsule TAKE ONE CAPSULE BY MOUTH ONCE DAILY 90 capsule 3  . lactulose (CHRONULAC) 10 GM/15ML solution Take 15 mLs (10 g total) 3 (three) times daily by mouth. 473 mL 5  . loratadine (CLARITIN) 10 MG tablet Take 10 mg by mouth daily.    . multivitamin-iron-minerals-folic acid (CENTRUM) chewable tablet Chew 1 tablet by mouth daily.    Marland Kitchen omega-3 acid ethyl esters (LOVAZA) 1 G capsule Take 1,000 mg by mouth 2 (two) times daily.    Marland Kitchen oxybutynin (DITROPAN) 5 MG tablet TAKE ONE TABLET BY MOUTH EVERY 8 HOURS AS NEEDED FOR BLADDER SPASMS 90 tablet 11  . simvastatin (ZOCOR) 40 MG tablet Take 20 mg by mouth daily.    . valACYclovir (VALTREX) 500 MG tablet Take 1 tablet (500 mg total) by mouth 2 (two)  times daily. 60 tablet 11  . zolpidem (AMBIEN) 5 MG tablet TAKE 1 TABLET BY MOUTH AT BEDTIME AS NEEDED FOR SLEEP 30 tablet 5  . dalfampridine (AMPYRA) 10 MG TB12 Take 1 tablet (10 mg total) by mouth 2 (two) times daily. (Patient not taking: Reported on 06/25/2017) 60 tablet 11   No facility-administered medications prior to visit.     PAST MEDICAL HISTORY: Past Medical History:  Diagnosis Date  . Multiple sclerosis ([place]) 2007    PAST SURGICAL HISTORY: Past Surgical History:  Procedure Laterality Date  . APPENDECTOMY  47 years old  . VASECTOMY  08/2013    FAMILY HISTORY: Family History  Problem Relation Age of Onset  . High blood pressure Mother   . High blood pressure Father     SOCIAL HISTORY: Social History   Socioeconomic History  . Marital status: Married    Spouse name: Eida  . Number of children: 2  . Years of education: 60  . Highest education level: Not on file  Social Needs  . Financial resource strain: Not on file  . Food insecurity - worry: Not on file  . Food insecurity - inability: Not on file  .  Transportation needs - medical: Not on file  . Transportation needs - non-medical: Not on file  Occupational History    Comment: Disabled  Tobacco Use  . Smoking status: Never Smoker  . Smokeless tobacco: Never Used  Substance and Sexual Activity  . Alcohol use: No  . Drug use: No  . Sexual activity: Not on file  Other Topics Concern  . Not on file  Social History Narrative   Patient lives at home with his wife [name]). Patient has two children. Patient is disabled.   Right handed.   Caffeine- one cup daily.     PHYSICAL EXAM  Vitals:   06/25/17 0823  BP: 118/72  Pulse: 80  Weight: 197 lb 6.4 oz (89.5 kg)   Body mass index is 27.53 kg/m.  Generalized: Well developed, in no acute distress  Head: normocephalic and atraumatic,. Oropharynx benign  Neck: Supple,  Cardiac: Regular rate rhythm, no murmur  Musculoskeletal: No deformity    Neurological examination   Mentation: Alert oriented to time, place, history taking. Attention span and concentration appropriate. Recent and remote memory intact.  Follows all commands speech and language fluent.   Cranial nerve II-XII: Visual acuity 20/100 right 20/70 left .Pupils were equal round reactive to light extraocular movements were full, and gaze horizontal nystagmus no ptosis visual Lawrence were full on confrontational test. Facial sensation and strength were normal. hearing was intact to finger rubbing bilaterally. Uvula tongue midline. head turning and shoulder shrug were normal and symmetric.Tongue protrusion into cheek strength was normal. Motor: normal bulk and tone, full strength in the BUE, moderate spasticity of bilateral lower extremities right worse than left ankle dorsiflexion 4 out of 5 on the right  Sensory: Length dependent decreased vibratory and light touch to shin.  Coordination: finger-nose-finger, mild dysmetria heel to shin hyperreflexia  Reflexes: Hyperreflexia plantar responses were flexor bilaterally. Gait and Station: Rising up from seated position with push off, stiff unsteady gait with quad cane ambulated 25 feet in  28 seconds   DIAGNOSTIC DATA (LABS, IMAGING, TESTING) - I reviewed patient records, labs, notes, testing and imaging myself where available.  Lab Results  Component Value Date   WBC 8.2 10/15/2015   HGB 14.1 10/15/2015   HCT 42.4 10/15/2015   MCV 90 10/15/2015   PLT 347 10/15/2015      Component Value Date/Time   NA 140 10/15/2015 1630   K 4.8 10/15/2015 1630   CL 101 10/15/2015 1630   CO2 25 10/15/2015 1630   GLUCOSE 91 10/15/2015 1630   GLUCOSE 98 07/19/2014 0456   BUN 12 10/15/2015 1630   CREATININE 1.14 10/15/2015 1630   CALCIUM 9.4 10/15/2015 1630   PROT 6.9 10/15/2015 1630   ALBUMIN 4.4 10/15/2015 1630   AST 18 10/15/2015 1630   ALT 23 10/15/2015 1630   ALKPHOS 40 10/15/2015 1630   BILITOT 0.4 10/15/2015 1630    GFRNONAA 77 10/15/2015 1630   GFRAA 89 10/15/2015 1630      Lab Results  Component Value Date   TSH 0.605 10/15/2015     ASSESSMENT AND PLAN 47 y.o. year old male  with relapsing remitting multiple sclerosis, just completed second round of [name] in September 2018.  Monthly lab monitoring continues.  Gait abnormality and depression.  Recently diagnosed with hyperlipidemia now on Zocor.MRI of the brain 04/03/2017 unchanged with severe burden of demyelinating disease without active demyelinating lesions when compared to MRI in 2017.   PLAN: Continue Cymbalta 60 mg daily for depression Ampyra  10 mg twice daily form signed walk test completed this is to help with your walking MRI of the brain without change, see above Continue [name] last dose September 2018 Continue monthly lab work Follow-up in 3 months, next with Dr. Krista Blue I spent 25 minutes in total face to face time with the patient more than 50% of which was spent counseling and coordination of care, reviewing test results reviewing medications and discussing and reviewing the diagnosis of multiple sclerosis and his gait abnormality and the reasoning behind ordering Ampyra.and importance of pt f/u with the [place], [facility], [facility], [facility] [address], [place], [place] [address] [phone]

## 2017-06-25 ENCOUNTER — Encounter: Payer: Self-pay | Admitting: Nurse Practitioner

## 2017-06-25 ENCOUNTER — Ambulatory Visit (INDEPENDENT_AMBULATORY_CARE_PROVIDER_SITE_OTHER): Payer: Medicare Other | Admitting: Nurse Practitioner

## 2017-06-25 VITALS — BP 118/72 | HR 80 | Wt 197.4 lb

## 2017-06-25 DIAGNOSIS — F329 Major depressive disorder, single episode, unspecified: Secondary | ICD-10-CM

## 2017-06-25 DIAGNOSIS — F32A Depression, unspecified: Secondary | ICD-10-CM

## 2017-06-25 DIAGNOSIS — R269 Unspecified abnormalities of gait and mobility: Secondary | ICD-10-CM

## 2017-06-25 DIAGNOSIS — G35 Multiple sclerosis: Secondary | ICD-10-CM

## 2017-06-25 NOTE — Patient Instructions (Signed)
Continue Cymbalta 60 mg daily for depression Ampyra 10 mg twice daily form signed walk test completed this is to help with your date MRI of the brain without change in December 2018 Continue [name] last dose September 2018 Continue monthly lab work Follow-up in 3 months, next with Dr. Krista Blue

## 2017-06-26 NOTE — Progress Notes (Signed)
I have reviewed and agreed above plan. 

## 2017-07-06 ENCOUNTER — Telehealth: Payer: Self-pay | Admitting: *Deleted

## 2017-07-06 NOTE — Telephone Encounter (Signed)
Collected by [facility] on 07/03/17:  HELPER T-LYMPH-CD4: Absolute CD 4 Helper: 103(L) % CD 4 Pos. Lymph.:17.1(L)  Lymphs (Absolute): 0.6 (L)  UA: normal   Creatinine, Serum: 0.93- wnl  TSH:0.605- wnl  **all other labs wnl**  Printed lab results provided to Dr. Krista Blue for review.  They will be sent for scanning.

## 2017-07-11 ENCOUNTER — Other Ambulatory Visit: Payer: Self-pay | Admitting: Neurology

## 2017-07-13 NOTE — Telephone Encounter (Signed)
[place] narcotic registry checked without issues.

## 2017-07-31 ENCOUNTER — Encounter: Payer: Self-pay | Admitting: Neurology

## 2017-08-03 ENCOUNTER — Telehealth: Payer: Self-pay | Admitting: *Deleted

## 2017-08-03 NOTE — Telephone Encounter (Signed)
Collected by [facility] on4/12/19:  HELPER T-LYMPH-CD4: Absolute CD 4 Helper:167(L) % CD 4 Pos. Lymph.:18.6(L)   UA: normal   Creatinine, Serum:0.97- wnl  TSH:0.719- wnl  **all other labs wnl**  Printed lab results provided to Dr. Krista Blue for review.  They will be sent for scanning.

## 2017-08-08 DIAGNOSIS — W01198A Fall on same level from slipping, tripping and stumbling with subsequent striking against other object, initial encounter: Secondary | ICD-10-CM | POA: Diagnosis not present

## 2017-08-08 DIAGNOSIS — S299XXA Unspecified injury of thorax, initial encounter: Secondary | ICD-10-CM | POA: Diagnosis not present

## 2017-08-08 DIAGNOSIS — S20212A Contusion of left front wall of thorax, initial encounter: Secondary | ICD-10-CM | POA: Diagnosis not present

## 2017-08-08 DIAGNOSIS — Z79899 Other long term (current) drug therapy: Secondary | ICD-10-CM | POA: Diagnosis not present

## 2017-08-08 DIAGNOSIS — G35 Multiple sclerosis: Secondary | ICD-10-CM | POA: Diagnosis not present

## 2017-08-08 DIAGNOSIS — R0781 Pleurodynia: Secondary | ICD-10-CM | POA: Diagnosis not present

## 2017-08-09 DIAGNOSIS — G35 Multiple sclerosis: Secondary | ICD-10-CM | POA: Diagnosis not present

## 2017-08-09 DIAGNOSIS — S2241XA Multiple fractures of ribs, right side, initial encounter for closed fracture: Secondary | ICD-10-CM | POA: Diagnosis not present

## 2017-08-09 DIAGNOSIS — W01198A Fall on same level from slipping, tripping and stumbling with subsequent striking against other object, initial encounter: Secondary | ICD-10-CM | POA: Diagnosis not present

## 2017-08-09 DIAGNOSIS — Z79899 Other long term (current) drug therapy: Secondary | ICD-10-CM | POA: Diagnosis not present

## 2017-08-09 DIAGNOSIS — S2242XA Multiple fractures of ribs, left side, initial encounter for closed fracture: Secondary | ICD-10-CM | POA: Diagnosis not present

## 2017-08-09 DIAGNOSIS — R1012 Left upper quadrant pain: Secondary | ICD-10-CM | POA: Diagnosis not present

## 2017-08-11 ENCOUNTER — Other Ambulatory Visit: Payer: Self-pay | Admitting: *Deleted

## 2017-08-11 ENCOUNTER — Encounter: Payer: Self-pay | Admitting: *Deleted

## 2017-08-11 MED ORDER — DULOXETINE HCL 60 MG PO CPEP: 60.0000 mg | ORAL_CAPSULE | Freq: Every day | ORAL | 3 refills | Status: DC

## 2017-08-11 MED ORDER — ZOLPIDEM TARTRATE 5 MG PO TABS: 5.0000 mg | ORAL_TABLET | Freq: Every evening | ORAL | 1 refills | Status: DC | PRN

## 2017-08-11 MED ORDER — OXYBUTYNIN CHLORIDE 5 MG PO TABS: ORAL_TABLET | ORAL | 3 refills | Status: DC

## 2017-08-11 MED ORDER — BACLOFEN 10 MG PO TABS: 10.0000 mg | ORAL_TABLET | Freq: Three times a day (TID) | ORAL | 3 refills | Status: DC

## 2017-08-12 DIAGNOSIS — S2242XA Multiple fractures of ribs, left side, initial encounter for closed fracture: Secondary | ICD-10-CM | POA: Diagnosis not present

## 2017-09-08 ENCOUNTER — Telehealth: Payer: Self-pay | Admitting: Neurology

## 2017-09-08 ENCOUNTER — Other Ambulatory Visit: Payer: Self-pay | Admitting: *Deleted

## 2017-09-08 MED ORDER — LACTULOSE 10 GM/15ML PO SOLN: 10.0000 g | Freq: Three times a day (TID) | ORAL | 3 refills | Status: DC | PRN

## 2017-09-08 MED ORDER — LACTULOSE 10 GM/15ML PO SOLN: 10.0000 g | Freq: Three times a day (TID) | ORAL | 11 refills | Status: DC | PRN

## 2017-09-08 NOTE — Telephone Encounter (Signed)
Patient takes 81ml TID PRN.  They would like rx sent to [facility].  He uses one bottle (455ml) per month.  Ok, per vo by Dr. Krista Blue, to provide rx for 90-day x 3 to mail order.  Sent to pharmacy.  His wife is aware.

## 2017-09-08 NOTE — Telephone Encounter (Signed)
pt's wife called said valacyclovir andlactulose (CHRONULAC) 10 GM/15ML solution have been denied for refills? Please call to advise on this matter.

## 2017-09-30 ENCOUNTER — Encounter: Payer: Self-pay | Admitting: Neurology

## 2017-09-30 ENCOUNTER — Ambulatory Visit (INDEPENDENT_AMBULATORY_CARE_PROVIDER_SITE_OTHER): Payer: Medicare Other | Admitting: Neurology

## 2017-09-30 VITALS — BP 129/87 | HR 86 | Ht 71.0 in | Wt 191.0 lb

## 2017-09-30 DIAGNOSIS — G35 Multiple sclerosis: Secondary | ICD-10-CM | POA: Diagnosis not present

## 2017-09-30 DIAGNOSIS — R269 Unspecified abnormalities of gait and mobility: Secondary | ICD-10-CM | POA: Diagnosis not present

## 2017-09-30 MED ORDER — DALFAMPRIDINE ER 10 MG PO TB12: 10.0000 mg | ORAL_TABLET | Freq: Two times a day (BID) | ORAL | 11 refills | Status: DC

## 2017-09-30 NOTE — Progress Notes (Signed)
[facility]  PATIENT: Lawrence Lamb DOB: [birth year]   REASON FOR VISIT: Follow-up for relapsing remitting multiple sclerosis, gait abnormality, depression HISTORY FROM: Patient and wife Lawrence Lamb ILLNESS:Lawrence Lamb is a 47 years old right-handed [place] American male, follow-up for relapsing remediating multiple sclerosis  He moved from [place] to [place] in 2013, was previously under the care of [place] [facility] specialist Dr. Stephani Lamb,  He was diagnosed with relapsing remitting multiple sclerosis in early 2007, presenting with right face, right leg weakness, has mild gait difficulty, diagnosis was confirmed by marked abnormal MRI of brain, and cervical lesions   He was initially treated with Betaseron for 18 months, clinically he was stable, but there was enhancing MRI lesions on Betaseron, he developed positive interferon antibodies. Tysabri was started since February 2009, initially every 4 weeks, later stretching to every 6 weeks, JC virus antibody was negative in September 2009 and April 2010, but JC virus antibody began to be positive since September 2010,he tolerates the [name] infusion very well from Feb 2009 till Jan 2013. He has repeat MRI of brain every 6 months.   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 He was treated withTecfidera from January 24th, 2014 for 6 months, but he continued to have slow progressive worsening gait difficulty, fell multiple times, had few rounds of steroid. Abnormal visual evoked response test secondary to prolongation of the P100 waveform on the left.   He nowcomplains of constant gait difficulty, bilateral lower extremity spasticity, also complains of urinary incontinence, double vision, he could no longer driving, also complains of  worsening memory trouble, difficulty focusing, could not take care of his young children by himself.   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,index value is 4.15 in June 2014, 2.66 in 11/22/2013  I have discussed treatment options with patient and his wife, he had progressive worsening gait difficulty, memory trouble, vision problem since he was off [name], taking [name] treatment, His symptoms were relatively stable while taking [place] Lamb is 10/998 chance of PML, He restarted on [name] infusion since Oct 14th 2014 UPDATE Feb 11th 2016:Lawrence Lamb His walking has much improved with Ampyra twice a day, no significant side effect, he did not had MRI of brain, cervical spine as previously scheduled,   UPDATE October 10 2014:Lawrence Lamb He was admitted to hospital March 28-30 2016 for flulike illness, he had much worsening gait difficulty ever since, now rely on his forefoot cane, he also complains of worsening nocturia, wake up multiple times during night, depressed he could not contribute much to his family, he is still waiting for social disability applications.   Update August the second 2016:Lawrence Lamb He continue complains of gait difficulty, right worse than left, rely on his cane, Effexor has been helpful for his depression, complains of worsening memory trouble Anti-[name] antibody was negative. He and his wife concerned about the side effect of lemtrada, wants to stay on Tysabri.  I have reviewed and compared MRI scan in June 2016 at [facility] to February 2016 MRIs, multiple supratentorium lesions, T1 black holes, no contrast enhancement, no significant change compared to previous scans,  UPDATE Feb 21 2015:Lawrence Lamb He started to feel increaesd weakness since Nov 1st, right leg worse than left, he took a shower, his leg gave out, fell down, difficult to get out of bathtub, he also has chronic constipations  UPDATE May 09 2015:Lawrence Lamb He was treated with one course of Medrol pack, his walking has improved  some, but overall has slow progress in gait difficulty, worsening memory trouble, he can no longer babysit his young children at home, mild depression, Effexor has made his depression worse, worsening constipations, failed to improve by over-the-counter medications  UPDATE August 13 2015:Lawrence Lamb [name] infusion was in July 09 2015, he was evaluated by Dr. Felecia Lamb in March 20 eighth 2017, he is at high risk to develop complication with continued [name] infusion, in addition, he continue have progressive worsening gait difficulty on [name], frequent flareups, we decided to proceed with Lawrence Lamb OCt 16th 2017:Lawrence Lamb He had first dose of lemtrada from July 10-14th, tolerate it well. I have reviewed hospital admission on November 04 2015 to Lawrence Lamb,  Patient woke up November 03 2015 was found to be confused, had a fever of 102, he just finished Lematrada infusion on July 14th2017, upon presentation, blood pressure was 119/54, heart rate of 79, temperature 99.2, patient was documented as low alert, no skin rash, blood culture was sent, he was treated with Levaquin, next  CT of the chest without contrast showed minimum bilateral dependent subsegmental atelectasis, no acute abnormality, MRI of the brain with and without contrast on November 05 2015 showed advanced chronic demyelinating disease throughout the brain, unchanged from January 2017, no new lesion  X-rays was negative for pneumonia,  EKG showed sinus rhythm with heart rate of 97 Laboratory evaluation, glucose 94, creatinine 0.91, and Was mildly elevated 13 point 9, CPK was 282, WBC was 7.8, hemoglobin was 13 point 5, neutrophil was 6 5.6%, influenza a antibody was positive, UA showed negative leukoesterase, negative nitrate,  He was treated with antibiotics, IV solution, his symptoms quickly improved over 4 days, he is now at his baseline, ambulate with a walker, he does not drive, his sister stays with him during the day, he  watches TV, He complains of worsening visual problem, he was seen by optometrist, he could see close vision, but could not read far away.   UPDATE October 02 2016:Lawrence Lamb He denies any significant change, he ambulate with a cane, he continue have significant memory loss, denies significant depression, mild fatigue, he attend adult day program. He continues to have mild to moderate constipation, bladder urgency.  He will have repeat Lematrada infusion in July 2018, 2 days following previous infusion on November 03 2016, he was admitted to the hospital for fever, symptoms quickly improved with antibiotic treatment, but UA showed negative leukoesterase, negative nitrate, CT chest showed minimum bilateral dependent actelesis, no acute abnormality He has monthly lab by Lemetrada at the middle of month.  UPDATE Sept 6th 2018: He tolerates second round of Lemetrada well, finished infusion last week, no fever noted. He has baseline gait abnormality, no significant worsening, also complains of mild depression anxiety insomnia taking melatonin, Ambien as needed   UPDATE September 30 2017: MRI of the brain 04/03/2017 unchanged with severe burden of demyelinating disease without active demyelinating lesions when compared to MRI in 2017.   He is overall fairly stable, laboratory evaluations was within normal range, CD4 count is trending up,  has stopped the Valtrex treatment, he fell showering April 2019 in with 2 broken ribs on the left side, now recovered, there is no significant change in his gait abnormality  REVIEW OF SYSTEMS: Full 14 system review of systems performed and notable only for those listed, all others are neg:  Double vision,  blurred vision, constipation, walking difficulty, memory loss, weakness, confusion, depression   ALLERGIES: No Known Allergies  HOME MEDICATIONS: Outpatient Medications Prior to Visit  Medication Sig Dispense Refill  . Alemtuzumab ([name]) 12 MG/1.2ML SOLN Inject into the  vein.    . baclofen (LIORESAL) 10 MG tablet Take 1 tablet (10 mg total) by mouth 3 (three) times daily. 270 tablet 3  . Cholecalciferol (VITAMIN D PO) Take by mouth 2 (two) times daily. Takes 2000units daily    . dalfampridine (AMPYRA) 10 MG TB12 Take 1 tablet (10 mg total) by mouth 2 (two) times daily. (Patient not taking: Reported on 06/25/2017) 60 tablet 11  . Docusate Calcium (STOOL SOFTENER PO) Take by mouth 2 (two) times daily as needed.    . DULoxetine (CYMBALTA) 60 MG capsule Take 1 capsule (60 mg total) by mouth daily. 90 capsule 3  . lactulose (CHRONULAC) 10 GM/15ML solution Take 15 mLs (10 g total) by mouth 3 (three) times daily as needed for mild constipation. 1419 mL 3  . loratadine (CLARITIN) 10 MG tablet Take 10 mg by mouth daily.    . multivitamin-iron-minerals-folic acid (CENTRUM) chewable tablet Chew 1 tablet by mouth daily.    Marland Kitchen omega-3 acid ethyl esters (LOVAZA) 1 G capsule Take 1,000 mg by mouth 2 (two) times daily.    Marland Kitchen oxybutynin (DITROPAN) 5 MG tablet TAKE ONE TABLET BY MOUTH EVERY 8 HOURS AS NEEDED FOR BLADDER SPASMS 270 tablet 3  . simvastatin (ZOCOR) 40 MG tablet Take 20 mg by mouth daily.    Marland Kitchen zolpidem (AMBIEN) 5 MG tablet Take 1 tablet (5 mg total) by mouth at bedtime as needed. for sleep 90 tablet 1   No facility-administered medications prior to visit.     PAST MEDICAL HISTORY: Past Medical History:  Diagnosis Date  . Multiple sclerosis ([place]) 2007    PAST [place] HISTORY: Past [place] History:  Procedure Laterality Date  . APPENDECTOMY  47 years old  . VASECTOMY  08/2013    FAMILY HISTORY: Family History  Problem Relation Age of Onset  . High blood pressure Mother   . High blood pressure Father     SOCIAL HISTORY: Social History   Socioeconomic History  . Marital status: Married    Spouse name: Eida  . Number of children: 2  . Years of education: 2  . Highest education level: Not on file  Occupational History    Comment: Disabled  Social  Needs  . Financial resource strain: Not on file  . Food insecurity:    Worry: Not on file    Inability: Not on file  . Transportation needs:    Medical: Not on file    Non-medical: Not on file  Tobacco Use  . Smoking status: Never Smoker  . Smokeless tobacco: Never Used  Substance and Sexual Activity  . Alcohol use: No  . Drug use: No  . Sexual activity: Not on file  Lifestyle  . Physical activity:    Days per week: Not on file    Minutes per session: Not on file  . Stress: Not on file  Relationships  . Social connections:    Talks on phone: Not on file    Gets together: Not on file    Attends religious service: Not on file    Active member of club or organization: Not on file    Attends meetings of clubs or organizations: Not on file    Relationship status: Not on file  . Intimate partner  violence:    Fear of current or ex partner: Not on file    Emotionally abused: Not on file    Physically abused: Not on file    Forced sexual activity: Not on file  Other Topics Concern  . Not on file  Social History Narrative   Patient lives at home with his wife [name]). Patient has two children. Patient is disabled.   Right handed.   Caffeine- one cup daily.     PHYSICAL EXAM  Vitals:   09/30/17 0824  BP: 129/87  Pulse: 86  Weight: 191 lb (86.6 kg)  Height: 5\' 11"  (1.803 m)   Body mass index is 26.64 kg/m.  Generalized: Well developed, in no acute distress  Head: normocephalic and atraumatic,. Oropharynx benign  Neck: Supple,  Cardiac: Regular rate rhythm, no murmur  Musculoskeletal: No deformity   Neurological examination   Mentation: Alert oriented to time, place, history taking. Attention span and concentration appropriate. Recent and remote memory intact.  Follows all commands speech and language fluent.   Cranial nerve II-XII: Pupils were equal round reactive to light extraocular movements were full, and gaze horizontal nystagmus no ptosis visual field were  full on confrontational test. Facial sensation and strength were normal. hearing was intact to finger rubbing bilaterally. Uvula tongue midline. head turning and shoulder shrug were normal and symmetric.Tongue protrusion into cheek strength was normal. Motor: moderate spasticity of bilateral lower extremities right worse than left ankle dorsiflexion 4 out of 5 on the right  Sensory: Length dependent decreased vibratory and light touch to shin.  Coordination: finger-nose-finger, mild dysmetria heel to shin hyperreflexia  Reflexes: Hyperreflexia plantar responses were flexor bilaterally. Gait and Station: Rising up from seated position with push off, stiff unsteady gait, dragging his right leg   DIAGNOSTIC DATA (LABS, IMAGING, TESTING) - I reviewed patient records, labs, notes, testing and imaging myself where available.  Lab Results  Component Value Date   WBC 8.2 10/15/2015   HGB 14.1 10/15/2015   HCT 42.4 10/15/2015   MCV 90 10/15/2015   PLT 347 10/15/2015      Component Value Date/Time   NA 140 10/15/2015 1630   K 4.8 10/15/2015 1630   CL 101 10/15/2015 1630   CO2 25 10/15/2015 1630   GLUCOSE 91 10/15/2015 1630   GLUCOSE 98 07/19/2014 0456   BUN 12 10/15/2015 1630   CREATININE 1.14 10/15/2015 1630   CALCIUM 9.4 10/15/2015 1630   PROT 6.9 10/15/2015 1630   ALBUMIN 4.4 10/15/2015 1630   AST 18 10/15/2015 1630   ALT 23 10/15/2015 1630   ALKPHOS 40 10/15/2015 1630   BILITOT 0.4 10/15/2015 1630   GFRNONAA 77 10/15/2015 1630   GFRAA 89 10/15/2015 1630      Lab Results  Component Value Date   TSH 0.605 10/15/2015     ASSESSMENT AND PLAN 47 y.o. year old male  Relapsing Remitting Multiple Sclerosis  completed second round of [name] in September 2018.    Monthly lab monitoring continues.  Gait abnormality and depression.   Marland KitchenMRI of the brain 04/03/2017 unchanged with severe burden of demyelinating disease without active demyelinating lesions when compared to MRI in  2017.  Repeat MRI brain w/wo in Dec 2019  Gait abnormality  Referred to home physical therapy  Start Ampra 10 mg twice a day  Depression  Cymbalta 60 mg daily  Marcial Pacas, M.D. Ph.D.  [facility] [place], [place] [address] Phone: [phone] Fax:  [phone] 

## 2017-10-01 ENCOUNTER — Encounter: Payer: Self-pay | Admitting: Neurology

## 2017-10-06 ENCOUNTER — Telehealth: Payer: Self-pay | Admitting: *Deleted

## 2017-10-06 NOTE — Telephone Encounter (Signed)
Collected by [facility] on6/13/19:  CBC: WBC: 3.3 (L)  UA: wnl  TSH: 0.774 (wnl)  Creatinine: 0.83 (wnl)  Results reviewed by Dr. Krista Blue and sent for scanning to patient's chart.

## 2017-10-08 ENCOUNTER — Telehealth: Payer: Self-pay | Admitting: *Deleted

## 2017-10-08 NOTE — Telephone Encounter (Signed)
PA for Ampyra approved by [facility] [phone]).  Member ST#[id number].  Valid through 10/08/2019.

## 2017-10-13 DIAGNOSIS — M7552 Bursitis of left shoulder: Secondary | ICD-10-CM | POA: Diagnosis not present

## 2017-10-13 DIAGNOSIS — M7502 Adhesive capsulitis of left shoulder: Secondary | ICD-10-CM | POA: Diagnosis not present

## 2017-10-13 DIAGNOSIS — Z6827 Body mass index (BMI) 27.0-27.9, adult: Secondary | ICD-10-CM | POA: Diagnosis not present

## 2017-11-03 ENCOUNTER — Telehealth: Payer: Self-pay | Admitting: *Deleted

## 2017-11-03 NOTE — Telephone Encounter (Signed)
Labs collected on 10/28/17:  CBC w/ Diff:  Neutrophils (Absolute): 1.0 (L) (all other values wnl)  UA: wnl  Creatinine: 0.94 (wnl)  TSH: 0.985 (wnl)

## 2017-12-03 ENCOUNTER — Encounter: Payer: Self-pay | Admitting: Neurology

## 2017-12-07 ENCOUNTER — Telehealth: Payer: Self-pay | Admitting: *Deleted

## 2017-12-07 NOTE — Telephone Encounter (Signed)
Labs collected on 12/03/17:  Helper T-Lymph-CD4: Absolute CD 4 Helper: 172 (L) % CD 4 Pos. Lymph: 21.5 (L) (all other values wnl)  UA: wnl  Creatinine: 0.99 (wnl)  TSH: 0.923 (wnl)  Dr. Krista Blue has reviewed.  Copy sent for scanning.

## 2017-12-30 ENCOUNTER — Encounter: Payer: Self-pay | Admitting: Neurology

## 2017-12-31 ENCOUNTER — Telehealth: Payer: Self-pay | Admitting: *Deleted

## 2017-12-31 NOTE — Telephone Encounter (Signed)
Labs collected on 12/30/17:  Helper T-Lymph-CD4 Absolute CD4 Helper: 177 (L) % CD 4 Pos. Lymph.: 19.7 (L)  CBC w/ Diff:  wnl  UA: wnl  Creatinine: 0.98 (wnl)  TSH: 0.769 (wnl)

## 2018-02-03 ENCOUNTER — Encounter: Payer: Self-pay | Admitting: Neurology

## 2018-02-09 ENCOUNTER — Telehealth: Payer: Self-pay | Admitting: *Deleted

## 2018-02-09 NOTE — Telephone Encounter (Signed)
Labs collected on 02/03/18:  Helper T-Lymph-CD4 Absolute CD4 Helper: 181 (L) % CD 4 Pos. Lymph.: 22.6 (L)  CBC w/ Diff:   RDW: 12.0 (L)  UA: wnl  Creatinine: 0.89 (wnl)  TSH: 0.626 (wnl)

## 2018-02-17 ENCOUNTER — Other Ambulatory Visit: Payer: Self-pay | Admitting: Neurology

## 2018-03-16 ENCOUNTER — Other Ambulatory Visit: Payer: Self-pay | Admitting: *Deleted

## 2018-03-16 DIAGNOSIS — G35 Multiple sclerosis: Secondary | ICD-10-CM

## 2018-03-20 ENCOUNTER — Other Ambulatory Visit: Payer: Self-pay | Admitting: Neurology

## 2018-03-22 ENCOUNTER — Telehealth: Payer: Self-pay | Admitting: Neurology

## 2018-03-22 NOTE — Telephone Encounter (Signed)
Meidcare/medicaid no auth.  lvm for pt to call me back to see if he would like to go to [facility] where he had his other exams at

## 2018-03-24 NOTE — Telephone Encounter (Signed)
Spoke to the patient wife she would like it to be done at [facility]. He is scheduled for 03/31/18 arrival time is 3:45 pm. I lvm for pt wife to be aware .

## 2018-03-31 ENCOUNTER — Encounter: Payer: Self-pay | Admitting: Neurology

## 2018-03-31 DIAGNOSIS — G35 Multiple sclerosis: Secondary | ICD-10-CM | POA: Diagnosis not present

## 2018-04-05 ENCOUNTER — Encounter: Payer: Self-pay | Admitting: Neurology

## 2018-04-05 ENCOUNTER — Other Ambulatory Visit: Payer: PRIVATE HEALTH INSURANCE

## 2018-04-06 ENCOUNTER — Telehealth: Payer: Self-pay | Admitting: *Deleted

## 2018-04-06 NOTE — Telephone Encounter (Addendum)
Labs collected on 04/05/18:   Helper T-Lymph-CD4 Absolute CD4 Helper: 203 (L) % CD 4 Pos. Lymph.: 22.6 (L)  CBC w/ Diff:  RDW: 12.2 (L)  UA: wnl  Creatinine: 0.97(wnl)  TSH: 0.554(wnl)  ALT+AST+TBili: wnl  Results provided to Dr. Krista Blue for review.

## 2018-05-02 NOTE — Progress Notes (Signed)
[facility]  PATIENT: Lawrence Lamb DOB: [birth year]   REASON FOR VISIT: Follow-up for relapsing remitting multiple sclerosis, gait abnormality, depression HISTORY FROM: Patient and wife [place] ILLNESS:Lawrence Lamb is a 48 years old right-handed [place] American male, follow-up for relapsing remediating multiple sclerosis  He moved from [place] to [place] in 2013, was previously under the care of [facility] [facility] specialist Dr. [name],  He was diagnosed with relapsing remitting multiple sclerosis in early 2007, presenting with right face, right leg weakness, has mild gait difficulty, diagnosis was confirmed by marked abnormal MRI of brain, and cervical lesions   He was initially treated with Betaseron for 18 months, clinically he was stable, but there was enhancing MRI lesions on Betaseron, he developed positive interferon antibodies. Tysabri was started since February 2009, initially every 4 weeks, later stretching to every 6 weeks, JC virus antibody was negative in September 2009 and April 2010, but JC virus antibody began to be positive since September 2010,he tolerates the [name] infusion very well from Feb 2009 till Jan 2013. He has repeat MRI of brain every 6 months.   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 He was treated withTecfidera from January 24th, 2014 for 6 months, but he continued to have slow progressive worsening gait difficulty, fell multiple times, had few rounds of steroid. Abnormal visual evoked response test secondary to prolongation of the P100 waveform on the left.   He nowcomplains of constant gait difficulty, bilateral lower extremity spasticity, also complains of urinary incontinence, double vision, he could no longer driving, also complains of  worsening memory trouble, difficulty focusing, could not take care of his young children by himself.   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,index value is 4.15 in June 2014, 2.66 in 11/22/2013  I have discussed treatment options with patient and his wife, he had progressive worsening gait difficulty, memory trouble, vision problem since he was off [name], taking [name] treatment, His symptoms were relatively stable while taking [name] is 10/998 chance of PML, He restarted on [name] infusion since Oct 14th 2014 UPDATE Feb 11th 2016:YY His walking has much improved with Ampyra twice a day, no significant side effect, he did not had MRI of brain, cervical spine as previously scheduled,   UPDATE October 10 2014:YY He was admitted to hospital March 28-30 2016 for flulike illness, he had much worsening gait difficulty ever since, now rely on his forefoot cane, he also complains of worsening nocturia, wake up multiple times during night, depressed he could not contribute much to his family, he is still waiting for social disability applications.   Update August the second 2016:YY He continue complains of gait difficulty, right worse than left, rely on his cane, Effexor has been helpful for his depression, complains of worsening memory trouble Anti-[name] antibody was negative. He and his wife concerned about the side effect of lemtrada, wants to stay on Tysabri.  I have reviewed and compared MRI scan in June 2016 at [facility] to February 2016 MRIs, multiple supratentorium lesions, T1 black holes, no contrast enhancement, no significant change compared to previous scans,  UPDATE Feb 21 2015:YY He started to feel increaesd weakness since Nov 1st, right leg worse than left, he took a shower, his leg gave out, fell down, difficult to get out of bathtub, he also has chronic constipations  UPDATE May 09 2015:YY He was treated with one course of Medrol pack, his walking has improved  some, but overall has slow progress in gait difficulty, worsening memory trouble, he can no longer babysit his young children at home, mild depression, Effexor has made his depression worse, worsening constipations, failed to improve by over-the-counter medications  UPDATE August 13 2015:[name] [name] infusion was in July 09 2015, he was evaluated by Dr. Felecia Shelling in March 20 eighth 2017, he is at high risk to develop complication with continued [name] infusion, in addition, he continue have progressive worsening gait difficulty on [name], frequent flareups, we decided to proceed with [name]:YY He had first dose of lemtrada from July 10-14th, tolerate it well. I have reviewed hospital admission on November 04 2015 to [facility],  Patient woke up November 03 2015 was found to be confused, had a fever of 102, he just finished Lematrada infusion on July 14th2017, upon presentation, blood pressure was 119/54, heart rate of 79, temperature 99.2, patient was documented as low alert, no skin rash, blood culture was sent, he was treated with Levaquin, next  CT of the chest without contrast showed minimum bilateral dependent subsegmental atelectasis, no acute abnormality, MRI of the brain with and without contrast on November 05 2015 showed advanced chronic demyelinating disease throughout the brain, unchanged from January 2017, no new lesion  X-rays was negative for pneumonia,  EKG showed sinus rhythm with heart rate of 97 Laboratory evaluation, glucose 94, creatinine 0.91, and Was mildly elevated 13 point 9, CPK was 282, WBC was 7.8, hemoglobin was 13 point 5, neutrophil was 6 5.6%, influenza a antibody was positive, UA showed negative leukoesterase, negative nitrate,  He was treated with antibiotics, IV solution, his symptoms quickly improved over 4 days, he is now at his baseline, ambulate with a walker, he does not drive, his sister stays with him during the day, he  watches TV, He complains of worsening visual problem, he was seen by optometrist, he could see close vision, but could not read far away.   UPDATE October 02 2016:YY He denies any significant change, he ambulate with a cane, he continue have significant memory loss, denies significant depression, mild fatigue, he attend adult day program. He continues to have mild to moderate constipation, bladder urgency.  He will have repeat Lematrada infusion in July 2018, 2 days following previous infusion on November 03 2016, he was admitted to the hospital for fever, symptoms quickly improved with antibiotic treatment, but UA showed negative leukoesterase, negative nitrate, CT chest showed minimum bilateral dependent actelesis, no acute abnormality He has monthly lab by Lemetrada at the middle of month. UPDATE Sept 6th 2018: He tolerates second round of Lemetrada well, finished infusion last week, no fever noted. He has baseline gait abnormality, no significant worsening, also complains of mild depression anxiety insomnia taking melatonin, Ambien as needed  UPDATE 12/6/2018CM Lawrence Lamb, 48 year old male returns for follow-up with history of relapsing remitting multiple sclerosis he has gait abnormality at baseline and ambulates with a quad cane.  This is not gotten worse.  He has depression and is on Cymbalta.  He is on [place] with monthly labs.  His last infusion was in September.  He did not return phone calls about his [place].  He has not scheduled his MRI of the brain.  He returns for reevaluation UPDATE 3/7/2019CM Lawrence Lamb 48 year old male returns for follow-up with history of relapsing remitting multiple sclerosis gait abnormality at  baseline and depression.  He is on [place] with last infusion in September.  He gets his monthly lab work.  MRI of the brain 04/03/2017 unchanged with severe burden of demyelinating disease without active demyelinating lesions when compared to MRI in 2017.  He has not  followed up with returning phone calls to [place].  He is currently taking Cymbalta for depression with good results.  He ambulates with a quad cane and denies any recent falls.  He is on baclofen for spasticity.  He was recently placed on Zocor 20 mg at the [place]. No new neurologic symptoms he returns for reevaluation UPDATE September 30 2017:YY MRI of the brain 04/03/2017 unchanged with severe burden of demyelinating disease without active demyelinating lesions when compared to MRI in 2017.  He is overall fairly stable, laboratory evaluations was within normal range, CD4 count is trending up,  has stopped the Valtrex treatment, he fell showering April 2019 in with 2 broken ribs on the left side, now recovered, there is no significant change in his gait abnormality UPDATE January 13, 2020CM Lawrence Lamb, 48 year old male returns for follow-up with history of relapsing remitting multiple sclerosis.  He has severe burden of demyelinating disease with his most recent MRI December 2018.  He has his CD of his MRI of the brain done December 2019 at [facility] rocking him healthcare for Dr. Krista Blue to evaluate.  He remains on [name] infusions.  He continues to have monthly labs.  He denies any falls since last seen ambulates with a quad cane.  He remains on baclofen for spasticity.  Overall he is fairly stable no significant change in his gait abnormality.  He remains on Cymbalta for depression.  He returns for reevaluation REVIEW OF SYSTEMS: Full 14 system review of systems performed and notable only for those listed, all others are neg:  Constitutional: neg  Cardiovascular: neg Ear/Nose/Throat: neg  Skin: neg Eyes: neg Respiratory: neg Gastroitestinal: neg  Hematology/Lymphatic: neg  Endocrine: neg Musculoskeletal: Gait abnormality Allergy/Immunology: neg Neurological: neg Psychiatric: Depression Sleep : neg   ALLERGIES: No Known Allergies  HOME MEDICATIONS: Outpatient Medications Prior to Visit  Medication  Sig Dispense Refill  . Alemtuzumab ([name]) 12 MG/1.2ML SOLN Inject into the vein.    . baclofen (LIORESAL) 10 MG tablet Take 1 tablet (10 mg total) by mouth 3 (three) times daily. 270 tablet 3  . Cholecalciferol (VITAMIN D PO) Take by mouth 2 (two) times daily. Takes 2000units daily    . dalfampridine (AMPYRA) 10 MG TB12 Take 1 tablet (10 mg total) by mouth 2 (two) times daily. 60 tablet 11  . Docusate Calcium (STOOL SOFTENER PO) Take by mouth 2 (two) times daily as needed.    . DULoxetine (CYMBALTA) 60 MG capsule Take 1 capsule (60 mg total) by mouth daily. 90 capsule 3  . lactulose (CHRONULAC) 10 GM/15ML solution TAKE 15 MLS (10 GM TOTAL) BY MOUTH 3 (THREE) TIMES DAILY AS NEEDED FOR MILD CONSTIPATION. 1419 mL 3  . loratadine (CLARITIN) 10 MG tablet Take 10 mg by mouth daily.    . multivitamin-iron-minerals-folic acid (CENTRUM) chewable tablet Chew 1 tablet by mouth daily.    Marland Kitchen omega-3 acid ethyl esters (LOVAZA) 1 G capsule Take 1,000 mg by mouth 2 (two) times daily.    Marland Kitchen oxybutynin (DITROPAN) 5 MG tablet TAKE ONE TABLET BY MOUTH EVERY 8 HOURS AS NEEDED FOR BLADDER SPASMS 270 tablet 3  . simvastatin (ZOCOR) 40 MG tablet Take 20 mg by mouth daily.    Marland Kitchen  zolpidem (AMBIEN) 5 MG tablet TAKE 1 TABLET AT BEDTIME AS NEEDED FOR SLEEP 90 tablet 1   No facility-administered medications prior to visit.     PAST MEDICAL HISTORY: Past Medical History:  Diagnosis Date  . Multiple sclerosis ([place]) 2007    PAST SURGICAL HISTORY: Past Surgical History:  Procedure Laterality Date  . APPENDECTOMY  48 years old  . VASECTOMY  08/2013    FAMILY HISTORY: Family History  Problem Relation Age of Onset  . High blood pressure Mother   . High blood pressure Father     SOCIAL HISTORY: Social History   Socioeconomic History  . Marital status: Married    Spouse name: Eida  . Number of children: 2  . Years of education: 76  . Highest education level: Not on file  Occupational History    Comment:  Disabled  Social Needs  . Financial resource strain: Not on file  . Food insecurity:    Worry: Not on file    Inability: Not on file  . Transportation needs:    Medical: Not on file    Non-medical: Not on file  Tobacco Use  . Smoking status: Never Smoker  . Smokeless tobacco: Never Used  Substance and Sexual Activity  . Alcohol use: No  . Drug use: No  . Sexual activity: Not on file  Lifestyle  . Physical activity:    Days per week: Not on file    Minutes per session: Not on file  . Stress: Not on file  Relationships  . Social connections:    Talks on phone: Not on file    Gets together: Not on file    Attends religious service: Not on file    Active member of club or organization: Not on file    Attends meetings of clubs or organizations: Not on file    Relationship status: Not on file  . Intimate partner violence:    Fear of current or ex partner: Not on file    Emotionally abused: Not on file    Physically abused: Not on file    Forced sexual activity: Not on file  Other Topics Concern  . Not on file  Social History Narrative   Patient lives at home with his wife [name]). Patient has two children. Patient is disabled.   Right handed.   Caffeine- one cup daily.     PHYSICAL EXAM  Vitals:   05/03/18 0928  BP: 139/84  Pulse: 84  Resp: 16  Weight: 200 lb (90.7 kg)  Height: 5\' 11"  (1.803 m)   Body mass index is 27.89 kg/m.  Generalized: Well developed, in no acute distress  Head: normocephalic and atraumatic,. Oropharynx benign  Neck: Supple,  Musculoskeletal: No deformity   Neurological examination   Mentation: Alert oriented to time, place, history taking. Attention span and concentration appropriate. Recent and remote memory intact.  Follows all commands speech and language fluent.   Cranial nerve II-XII: .Pupils were equal round reactive to light extraocular movements were full, and gaze horizontal nystagmus no ptosis visual field were full on  confrontational test. Facial sensation and strength were normal. hearing was intact to finger rubbing bilaterally. Uvula tongue midline. head turning and shoulder shrug were normal and symmetric.Tongue protrusion into cheek strength was normal. Motor: full strength in the BUE, moderate spasticity of bilateral lower extremities right worse than left ankle dorsiflexion 4 out of 5 on the right  Sensory: Length dependent decreased vibratory and light touch to shin.  Coordination: finger-nose-finger, mild dysmetria heel to shin hyperreflexia  Reflexes: Hyperreflexia plantar responses were flexor bilaterally. Gait and Station: Rising up from seated position with push off, stiff unsteady gait with quad cane dragging the right leg  DIAGNOSTIC DATA (LABS, IMAGING, TESTING) - I reviewed patient records, labs, notes, testing and imaging myself where available.  Lab Results  Component Value Date   WBC 8.2 10/15/2015   HGB 14.1 10/15/2015   HCT 42.4 10/15/2015   MCV 90 10/15/2015   PLT 347 10/15/2015      Component Value Date/Time   NA 140 10/15/2015 1630   K 4.8 10/15/2015 1630   CL 101 10/15/2015 1630   CO2 25 10/15/2015 1630   GLUCOSE 91 10/15/2015 1630   GLUCOSE 98 07/19/2014 0456   BUN 12 10/15/2015 1630   CREATININE 1.14 10/15/2015 1630   CALCIUM 9.4 10/15/2015 1630   PROT 6.9 10/15/2015 1630   ALBUMIN 4.4 10/15/2015 1630   AST 18 10/15/2015 1630   ALT 23 10/15/2015 1630   ALKPHOS 40 10/15/2015 1630   BILITOT 0.4 10/15/2015 1630   GFRNONAA 77 10/15/2015 1630   GFRAA 89 10/15/2015 1630      Lab Results  Component Value Date   TSH 0.605 10/15/2015     ASSESSMENT AND PLAN 48 y.o. year old male  with relapsing remitting multiple sclerosis, just completed second round of [name] in September 2018.  Monthly lab monitoring continues.  Gait abnormality and depression.  MRI of the brain 04/03/2017 unchanged with severe burden of demyelinating disease without active demyelinating  lesions when compared to MRI in 2017.Pt brought Cd of MRI of the brain Dec 2019 for Dr. Greer Pickerel review    PLAN: Continue Cymbalta for depression Amypyra 10mg  twice daily for gait Continue baclofen for spasticity MRI pf the brain Dec 2019 Dr. Krista Blue has CD and will call you  Continue Monthly labs  Continuing using cane for gait abnormality Call for exacerbation of symptoms Follow up in 6 months  I spent 25 minutes in total face to face time with the patient more than 50% of which was spent counseling and coordination of care, reviewing test results reviewing medications and discussing and reviewing the diagnosis of multiple sclerosis and his gait abnormality importance of continued follow-up.  Patient was also encouraged to get his flu shot   Dennie Bible, [facility], [facility], APRN  [facility] [address], [place], [place] [address] [phone]

## 2018-05-03 ENCOUNTER — Telehealth: Payer: Self-pay | Admitting: *Deleted

## 2018-05-03 ENCOUNTER — Encounter: Payer: Self-pay | Admitting: Nurse Practitioner

## 2018-05-03 ENCOUNTER — Ambulatory Visit (INDEPENDENT_AMBULATORY_CARE_PROVIDER_SITE_OTHER): Payer: Medicare Other | Admitting: Nurse Practitioner

## 2018-05-03 ENCOUNTER — Other Ambulatory Visit: Payer: Self-pay

## 2018-05-03 VITALS — BP 139/84 | HR 84 | Resp 16 | Ht 71.0 in | Wt 200.0 lb

## 2018-05-03 DIAGNOSIS — F329 Major depressive disorder, single episode, unspecified: Secondary | ICD-10-CM | POA: Diagnosis not present

## 2018-05-03 DIAGNOSIS — R269 Unspecified abnormalities of gait and mobility: Secondary | ICD-10-CM

## 2018-05-03 DIAGNOSIS — G35 Multiple sclerosis: Secondary | ICD-10-CM

## 2018-05-03 DIAGNOSIS — F32A Depression, unspecified: Secondary | ICD-10-CM

## 2018-05-03 DIAGNOSIS — Z23 Encounter for immunization: Secondary | ICD-10-CM | POA: Diagnosis not present

## 2018-05-03 NOTE — Progress Notes (Signed)
I have reviewed and agreed above plan. 

## 2018-05-03 NOTE — Patient Instructions (Signed)
Continue Cymbalta for depression Amypyra 10mg  twice daily MRI pf the brain Dec 2019 Dr. Krista Blue has CD and will call you  Continue Monthly labs  Continuing using cane for gait abnormality Call for exacerbation of symptoms Follow up in 6 months

## 2018-05-03 NOTE — Telephone Encounter (Signed)
MRI brain w/wo completed on 03/31/18 at [facility].  Dr. Krista Blue has reviewed the scan and would like the patient called with the following information:  Impression: Advanced changes of Multiple Sclerosis appears stable from the prior study.  No new or acute lesions.  Extensive cerebral volume loss bilaterally mostly in the parietal lobes is stable.  I spoke to his wife, Lawrence Lamb (on [place]).  She is aware of his results and verbalized understanding.

## 2018-05-05 ENCOUNTER — Encounter: Payer: Self-pay | Admitting: Neurology

## 2018-05-06 ENCOUNTER — Telehealth: Payer: Self-pay | Admitting: *Deleted

## 2018-05-06 NOTE — Telephone Encounter (Signed)
Labs collected on 05/05/2018:   CBC w/ Diff:wnl   UA: wnl  Creatinine: 0.91(wnl)  TSH: 0.668(wnl)  Results provided to Dr. Krista Blue for review.  They were also sent to scanning.

## 2018-05-27 ENCOUNTER — Telehealth: Payer: Self-pay | Admitting: *Deleted

## 2018-05-27 NOTE — Telephone Encounter (Signed)
Opened in error

## 2018-06-03 ENCOUNTER — Encounter: Payer: Self-pay | Admitting: Neurology

## 2018-06-08 ENCOUNTER — Telehealth: Payer: Self-pay | Admitting: *Deleted

## 2018-06-08 NOTE — Telephone Encounter (Signed)
Labs collected on 06/03/2018:   CBC w/ Diff:wnl  UA: Casts: present (abnormal) All other values wnl  ALT+AST+TBILI: wnl  Creatinine: 0.88(wnl)  TSH: 0.774(wnl)  Results provided to Dr. Krista Blue for review.  They were also sent to scanning.

## 2018-07-01 ENCOUNTER — Encounter: Payer: Self-pay | Admitting: Neurology

## 2018-07-06 ENCOUNTER — Telehealth: Payer: Self-pay | Admitting: *Deleted

## 2018-07-06 NOTE — Telephone Encounter (Signed)
Labs collected on03/13/2020:   CBC w/ Diff:wnl  UA: wnl  Creatinine: 0.99(wnl)  TSH: 0.546(wnl)  Results provided to Dr. Krista Blue for review. They were also sent to scanning.

## 2018-08-02 ENCOUNTER — Other Ambulatory Visit: Payer: Self-pay | Admitting: Neurology

## 2018-08-04 ENCOUNTER — Telehealth: Payer: Self-pay | Admitting: *Deleted

## 2018-08-04 NOTE — Telephone Encounter (Signed)
Labs collected on04/14/2020:   CBC w/ Diff:wnl  UA: wnl  Creatinine: 0.91(wnl)  TSH: 0.458(wnl)  Results provided to Dr. Krista Blue for review. They were also sent to scanning.

## 2018-08-23 ENCOUNTER — Other Ambulatory Visit: Payer: Self-pay | Admitting: Neurology

## 2018-08-23 DIAGNOSIS — R269 Unspecified abnormalities of gait and mobility: Secondary | ICD-10-CM

## 2018-08-23 DIAGNOSIS — G35 Multiple sclerosis: Secondary | ICD-10-CM

## 2018-08-28 ENCOUNTER — Other Ambulatory Visit: Payer: Self-pay | Admitting: Neurology

## 2018-09-02 ENCOUNTER — Telehealth: Payer: Self-pay | Admitting: *Deleted

## 2018-09-02 NOTE — Telephone Encounter (Signed)
[name] labs collected on05/13/2020:   CBC w/ Diff:wnl  UA:wnl  ALT+AST+TBILI: wnl  TSH: 0.636- wnl  Creatinine: 0.96- wnl  Results provided to Dr. Krista Blue for review. They were also sent to scanning.

## 2018-09-03 DIAGNOSIS — H469 Unspecified optic neuritis: Secondary | ICD-10-CM | POA: Diagnosis not present

## 2018-09-03 DIAGNOSIS — H52223 Regular astigmatism, bilateral: Secondary | ICD-10-CM | POA: Diagnosis not present

## 2018-09-03 DIAGNOSIS — H524 Presbyopia: Secondary | ICD-10-CM | POA: Diagnosis not present

## 2018-09-03 DIAGNOSIS — H5213 Myopia, bilateral: Secondary | ICD-10-CM | POA: Diagnosis not present

## 2018-10-01 ENCOUNTER — Encounter: Payer: Self-pay | Admitting: Neurology

## 2018-10-02 DIAGNOSIS — Z79899 Other long term (current) drug therapy: Secondary | ICD-10-CM | POA: Diagnosis not present

## 2018-10-02 DIAGNOSIS — R109 Unspecified abdominal pain: Secondary | ICD-10-CM | POA: Diagnosis not present

## 2018-10-02 DIAGNOSIS — G35 Multiple sclerosis: Secondary | ICD-10-CM | POA: Diagnosis not present

## 2018-10-02 DIAGNOSIS — K6389 Other specified diseases of intestine: Secondary | ICD-10-CM | POA: Diagnosis not present

## 2018-10-02 DIAGNOSIS — M5136 Other intervertebral disc degeneration, lumbar region: Secondary | ICD-10-CM | POA: Diagnosis not present

## 2018-10-02 DIAGNOSIS — K5900 Constipation, unspecified: Secondary | ICD-10-CM | POA: Diagnosis not present

## 2018-10-02 DIAGNOSIS — I878 Other specified disorders of veins: Secondary | ICD-10-CM | POA: Diagnosis not present

## 2018-10-05 ENCOUNTER — Telehealth: Payer: Self-pay | Admitting: *Deleted

## 2018-10-05 DIAGNOSIS — M533 Sacrococcygeal disorders, not elsewhere classified: Secondary | ICD-10-CM | POA: Diagnosis not present

## 2018-10-05 DIAGNOSIS — K5900 Constipation, unspecified: Secondary | ICD-10-CM | POA: Diagnosis not present

## 2018-10-05 NOTE — Telephone Encounter (Signed)
[name] labs collected on06/03/2019:   CBC w/ Diff:wnl  UA:wnl  TSH: 1.100- wnl  Creatinine: 0.89- wnl  Results provided to Dr. Krista Blue for review. They were also sent to scanning.

## 2018-10-10 DIAGNOSIS — R296 Repeated falls: Secondary | ICD-10-CM | POA: Diagnosis not present

## 2018-10-10 DIAGNOSIS — M549 Dorsalgia, unspecified: Secondary | ICD-10-CM | POA: Diagnosis not present

## 2018-10-10 DIAGNOSIS — G35 Multiple sclerosis: Secondary | ICD-10-CM | POA: Diagnosis not present

## 2018-10-10 DIAGNOSIS — K5900 Constipation, unspecified: Secondary | ICD-10-CM | POA: Diagnosis not present

## 2018-10-14 DIAGNOSIS — K5900 Constipation, unspecified: Secondary | ICD-10-CM | POA: Diagnosis not present

## 2018-10-14 DIAGNOSIS — R296 Repeated falls: Secondary | ICD-10-CM | POA: Diagnosis not present

## 2018-10-14 DIAGNOSIS — G35 Multiple sclerosis: Secondary | ICD-10-CM | POA: Diagnosis not present

## 2018-10-14 DIAGNOSIS — M549 Dorsalgia, unspecified: Secondary | ICD-10-CM | POA: Diagnosis not present

## 2018-10-19 DIAGNOSIS — K5900 Constipation, unspecified: Secondary | ICD-10-CM | POA: Diagnosis not present

## 2018-10-19 DIAGNOSIS — R296 Repeated falls: Secondary | ICD-10-CM | POA: Diagnosis not present

## 2018-10-19 DIAGNOSIS — G35 Multiple sclerosis: Secondary | ICD-10-CM | POA: Diagnosis not present

## 2018-10-19 DIAGNOSIS — M549 Dorsalgia, unspecified: Secondary | ICD-10-CM | POA: Diagnosis not present

## 2018-10-21 DIAGNOSIS — K5900 Constipation, unspecified: Secondary | ICD-10-CM | POA: Diagnosis not present

## 2018-10-21 DIAGNOSIS — G35 Multiple sclerosis: Secondary | ICD-10-CM | POA: Diagnosis not present

## 2018-10-21 DIAGNOSIS — M549 Dorsalgia, unspecified: Secondary | ICD-10-CM | POA: Diagnosis not present

## 2018-10-21 DIAGNOSIS — R296 Repeated falls: Secondary | ICD-10-CM | POA: Diagnosis not present

## 2018-10-26 DIAGNOSIS — R296 Repeated falls: Secondary | ICD-10-CM | POA: Diagnosis not present

## 2018-10-26 DIAGNOSIS — K5900 Constipation, unspecified: Secondary | ICD-10-CM | POA: Diagnosis not present

## 2018-10-26 DIAGNOSIS — G35 Multiple sclerosis: Secondary | ICD-10-CM | POA: Diagnosis not present

## 2018-10-26 DIAGNOSIS — M549 Dorsalgia, unspecified: Secondary | ICD-10-CM | POA: Diagnosis not present

## 2018-10-28 DIAGNOSIS — G35 Multiple sclerosis: Secondary | ICD-10-CM | POA: Diagnosis not present

## 2018-10-28 DIAGNOSIS — R296 Repeated falls: Secondary | ICD-10-CM | POA: Diagnosis not present

## 2018-10-28 DIAGNOSIS — M549 Dorsalgia, unspecified: Secondary | ICD-10-CM | POA: Diagnosis not present

## 2018-10-28 DIAGNOSIS — K5900 Constipation, unspecified: Secondary | ICD-10-CM | POA: Diagnosis not present

## 2018-10-31 NOTE — Progress Notes (Signed)
PATIENT: Lawrence Lamb DOB: [birth year]  REASON FOR VISIT: follow up HISTORY FROM: patient  HISTORY OF PRESENT ILLNESS: Today 11/01/18  HISTORY  HISTORY OF PRESENT ILLNESS:Lawrence Lamb is a 48 years old right-handed [place] American male, follow-up for relapsing remediating multiple sclerosis  He moved from [place] to [place] in 2013, was previously under the care of [facility] specialist Dr. [name],  He was diagnosed with relapsing remitting multiple sclerosis in early 2007, presenting with right face, right leg weakness, has mild gait difficulty, diagnosis was confirmed by marked abnormal MRI of brain, and cervical lesions   He was initially treated with Betaseron for 18 months, clinically he was stable, but there was enhancing MRI lesions on Betaseron, he developed positive interferon antibodies. Tysabri was started since February 2009, initially every 4 weeks, later stretching to every 6 weeks, JC virus antibody was negative in September 2009 and April 2010, but JC virus antibody began to be positive since September 2010,he tolerates the [name] infusion very well from Feb 2009 till Jan 2013. He has repeat MRI of brain every 6 months.   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 He was treated withTecfidera from January 24th, 2014 for 6 months, but he continued to have slow progressive worsening gait difficulty, fell multiple times, had few rounds of steroid. Abnormal visual evoked response test secondary to prolongation of the P100 waveform on the left.   He nowcomplains of constant gait difficulty, bilateral lower extremity spasticity, also complains of urinary incontinence, double vision, he could no longer driving, also complains of worsening memory trouble, difficulty focusing, could not take care  of his young children by himself.   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,index value is 4.15 in June 2014, 2.66 in 11/22/2013  I have discussed treatment options with patient and his wife, he had progressive worsening gait difficulty, memory trouble, vision problem since he was off [name], taking [name] treatment, His symptoms were relatively stable while taking [name] is 10/998 chance of PML, He restarted on [name] infusion since Oct 14th 2014 UPDATE Feb 11th 2016:YY His walking has much improved with Ampyra twice a day, no significant side effect, he did not had MRI of brain, cervical spine as previously scheduled,   UPDATE October 10 2014:YY He was admitted to hospital March 28-30 2016 for flulike illness, he had much worsening gait difficulty ever since, now rely on his forefoot cane, he also complains of worsening nocturia, wake up multiple times during night, depressed he could not contribute much to his family, he is still waiting for social disability applications.   Update August the second 2016:YY He continue complains of gait difficulty, right worse than left, rely on his cane, Effexor has been helpful for his depression, complains of worsening memory trouble Anti-[name] antibody was negative. He and his wife concerned about the side effect of lemtrada, wants to stay on Tysabri.  I have reviewed and compared MRI scan in June 2016 at [facility] to February 2016 MRIs, multiple supratentorium lesions, T1 black holes, no contrast enhancement, no significant change compared to previous scans,  UPDATE Feb 21 2015:YY He started to feel increaesd weakness since Nov 1st, right leg worse than left, he took a shower, his leg gave out, fell down, difficult to get out of bathtub, he also has chronic constipations   UPDATE May 09 2015:YY  He was treated with one course of Medrol pack, his walking has improved some, but overall has slow progress in gait difficulty, worsening  memory trouble, he can no longer babysit his young children at home, mild depression, Effexor has made his depression worse, worsening constipations, failed to improve by over-the-counter medications  UPDATE August 13 2015:[name] [name] infusion was in July 09 2015, he was evaluated by Dr. Felecia Shelling in March 20 eighth 2017, he is at high risk to develop complication with continued [name] infusion, in addition, he continue have progressive worsening gait difficulty on [name], frequent flareups, we decided to proceed with [name]:YY He had first dose of lemtrada from July 10-14th, tolerate it well. I have reviewed hospital admission on November 04 2015 to [facility],  Patient woke up November 03 2015 was found to be confused, had a fever of 102, he just finished Lematrada infusion on July 14th2017, upon presentation, blood pressure was 119/54, heart rate of 79, temperature 99.2, patient was documented as low alert, no skin rash, blood culture was sent, he was treated with Levaquin, next  CT of the chest without contrast showed minimum bilateral dependent subsegmental atelectasis, no acute abnormality, MRI of the brain with and without contrast on November 05 2015 showed advanced chronic demyelinating disease throughout the brain, unchanged from January 2017, no new lesion  X-rays was negative for pneumonia,  EKG showed sinus rhythm with heart rate of 97 Laboratory evaluation, glucose 94, creatinine 0.91, and Was mildly elevated 13 point 9, CPK was 282, WBC was 7.8, hemoglobin was 13 point 5, neutrophil was 6 5.6%, influenza a antibody was positive, UA showed negative leukoesterase, negative nitrate,  He was treated with antibiotics, IV solution, his symptoms quickly improved over 4 days, he is now at his baseline, ambulate with a walker, he does not drive, his sister stays with him during the day, he watches TV, He complains of worsening visual problem, he was  seen by optometrist, he could see close vision, but could not read far away.   UPDATE October 02 2016:YY He denies any significant change, he ambulate with a cane, he continue have significant memory loss, denies significant depression, mild fatigue, he attend adult day program. He continues to have mild to moderate constipation, bladder urgency.  He will have repeat Lematrada infusion in July 2018, 2 days following previous infusion on November 03 2016, he was admitted to the hospital for fever, symptoms quickly improved with antibiotic treatment, but UA showed negative leukoesterase, negative nitrate, CT chest showed minimum bilateral dependent actelesis, no acute abnormality He has monthly lab by Lemetrada at the middle of month. UPDATE Sept 6th 2018: He tolerates second round ofLemetradawell, finished infusion last week, no fever noted.He has baseline gait abnormality, no significant worsening, alsocomplains of mild depression anxiety insomnia taking melatonin, Ambien as needed UPDATE 12/6/2018CM Mr. Frisbee, 48 year old male returns for follow-up with history of relapsing remitting multiple sclerosis he has gait abnormality at baseline and ambulates with a quad cane.  This is not gotten worse.  He has depression and is on Cymbalta.  He is on [place] with monthly labs.  His last infusion was in September.  He did not return phone calls about his [place].  He has not scheduled his MRI of the brain.  He returns for reevaluation UPDATE 3/7/2019CM Mr. Toney Rakes 48 year old male returns for follow-up with history of relapsing remitting multiple sclerosis gait abnormality at baseline and depression.  He is on [place] with  last infusion in September.  He gets his monthly lab work.  MRI of the brain 04/03/2017 unchanged with severe burden of demyelinating disease without active demyelinating lesions when compared to MRI in 2017.  He has not followed up with returning phone calls to [place].  He is  currently taking Cymbalta for depression with good results.  He ambulates with a quad cane and denies any recent falls.  He is on baclofen for spasticity.  He was recently placed on Zocor 20 mg at the [place]. No new neurologic symptoms he returns for reevaluation UPDATE September 30 2017:YY MRI of the brain 04/03/2017 unchanged with severe burden of demyelinating disease without active demyelinating lesions when compared to MRI in 2017. He is overall fairly stable, laboratory evaluations was within normal range, CD4 count is trending up, has stopped the Valtrex treatment, he fell showering April 2019 inwith 2 broken ribs on the left side, now recovered, there is no significant change in his gait abnormality UPDATE January 13, 2020CM Mr. Busche, 48 year old male returns for follow-up with history of relapsing remitting multiple sclerosis.  He has severe burden of demyelinating disease with his most recent MRI December 2018.  He has his CD of his MRI of the brain done December 2019 at [facility] rocking him healthcare for Dr. Krista Blue to evaluate.  He remains on [name] infusions.  He continues to have monthly labs.  He denies any falls since last seen ambulates with a quad cane.  He remains on baclofen for spasticity.  Overall he is fairly stable no significant change in his gait abnormality.  He remains on Cymbalta for depression.  He returns for reevaluation  Update November 01 2018 SS: Mr. Inglett 48 year old male with history of relapsing remitting multiple sclerosis.  He has severe burden of demyelinating disease, last MRI of the brain 04/08/2018 at [facility], MRI of the brain appears stable from prior study, no new or acute lesions, extensive cerebral volume loss bilaterally mostly in the parietal lobes is stable.  Most recent [facility] labs 10/05/2018, CBC with differential, UA, TSH, creatinine all WNL.  He remains on baclofen 10 mg 3 times daily for spasticity, Amypra 10 mg twice daily for gait abnormality.    He denies any changes, he says he is stable. We called his wife. He had to go to [facility] due for right sided pain, was constipated, treated with prednisone for sciatica.  He continues to have some confusion. He is doing physical therapy for generalized weakness, ordered by PCP. He has had 3 falls since last visit. He uses cane. PT is helping. Needs a refill on Cymbalta and Ambien.  He does not take the Ambien every night, when he does it helps him sleep.  REVIEW OF SYSTEMS: Out of a complete 14 system review of symptoms, the patient complains only of the following symptoms, and all other reviewed systems are negative.  Gait abnormal, weakness, insomnia, confusion  ALLERGIES: No Known Allergies  HOME MEDICATIONS: Outpatient Medications Prior to Visit  Medication Sig Dispense Refill   Alemtuzumab ([name]) 12 MG/1.2ML SOLN Inject into the vein.     baclofen (LIORESAL) 10 MG tablet TAKE 1 TABLET THREE TIMES DAILY 270 tablet 3   Cholecalciferol (VITAMIN D PO) Take by mouth 2 (two) times daily. Takes 2000units daily     dalfampridine 10 MG TB12 TAKE 1 TABLET (10MG ) BY MOUTH TWICE A DAY 60 tablet 11   Docusate Calcium (STOOL SOFTENER PO) Take by mouth 2 (two) times daily as needed.  lactulose (CHRONULAC) 10 GM/15ML solution TAKE 15 MLS (10 GM TOTAL) BY MOUTH 3 (THREE) TIMES DAILY AS NEEDED FOR MILD CONSTIPATION. 1350 mL 3   loratadine (CLARITIN) 10 MG tablet Take 10 mg by mouth daily.     multivitamin-iron-minerals-folic acid (CENTRUM) chewable tablet Chew 1 tablet by mouth daily.     omega-3 acid ethyl esters (LOVAZA) 1 G capsule Take 1,000 mg by mouth 2 (two) times daily.     oxybutynin (DITROPAN) 5 MG tablet TAKE ONE TABLET BY MOUTH EVERY 8 HOURS AS NEEDED FOR BLADDER SPASMS 270 tablet 3   DULoxetine (CYMBALTA) 60 MG capsule Take 1 capsule (60 mg total) by mouth daily. 90 capsule 3   zolpidem (AMBIEN) 5 MG tablet TAKE 1 TABLET AT BEDTIME AS NEEDED FOR SLEEP 90 tablet 1    simvastatin (ZOCOR) 40 MG tablet Take 20 mg by mouth daily.     No facility-administered medications prior to visit.     PAST MEDICAL HISTORY: Past Medical History:  Diagnosis Date   Multiple sclerosis ([place]) 2007    PAST SURGICAL HISTORY: Past Surgical History:  Procedure Laterality Date   APPENDECTOMY  48 years old   VASECTOMY  08/2013    FAMILY HISTORY: Family History  Problem Relation Age of Onset   High blood pressure Mother    High blood pressure Father     SOCIAL HISTORY: Social History   Socioeconomic History   Marital status: Married    Spouse name: Eida   Number of children: 2   Years of education: 12   Highest education level: Not on file  Occupational History    Comment: Disabled  Scientist, product/process development strain: Not on file   Food insecurity    Worry: Not on file    Inability: Not on file   Transportation needs    Medical: Not on file    Non-medical: Not on file  Tobacco Use   Smoking status: Never Smoker   Smokeless tobacco: Never Used  Substance and Sexual Activity   Alcohol use: No   Drug use: No   Sexual activity: Not on file  Lifestyle   Physical activity    Days per week: Not on file    Minutes per session: Not on file   Stress: Not on file  Relationships   Social connections    Talks on phone: Not on file    Gets together: Not on file    Attends religious service: Not on file    Active member of club or organization: Not on file    Attends meetings of clubs or organizations: Not on file    Relationship status: Not on file   Intimate partner violence    Fear of current or ex partner: Not on file    Emotionally abused: Not on file    Physically abused: Not on file    Forced sexual activity: Not on file  Other Topics Concern   Not on file  Social History Narrative   Patient lives at home with his wife [name]). Patient has two children. Patient is disabled.   Right handed.   Caffeine- one cup  daily.      PHYSICAL EXAM  Vitals:   11/01/18 0904  BP: 120/76  Pulse: 72  Temp: 98.3 F (36.8 C)  Weight: 202 lb (91.6 kg)  Height: 5\' 11"  (1.803 m)   Body mass index is 28.17 kg/m.  Generalized: Well developed, in no acute distress   Neurological examination  Mentation: Alert oriented to time, place, limited historian, called his wife for most history. Follows all commands speech and language fluent Cranial nerve II-XII: Pupils were equal round reactive to light. Extraocular movements were full, visual field were full on confrontational test. Facial sensation and strength were normal. Uvula tongue midline. Head turning and shoulder shrug  were normal and symmetric. Motor: The motor testing reveals 5 over 5 strength of all 4 extremities. Good symmetric motor tone is noted throughout.  Sensory: Sensory testing is intact to soft touch on all 4 extremities. No evidence of extinction is noted.  Coordination: Cerebellar testing reveals good finger-nose-finger and heel-to-shin bilaterally.  Gait and station: Gait is unsteady, using quad cane, right leg mild spastic gait, had to push off from seated position, stiff gait Reflexes: Deep tendon reflexes are symmetric and normal bilaterally.   DIAGNOSTIC DATA (LABS, IMAGING, TESTING) - I reviewed patient records, labs, notes, testing and imaging myself where available.  Lab Results  Component Value Date   WBC 8.2 10/15/2015   HGB 14.1 10/15/2015   HCT 42.4 10/15/2015   MCV 90 10/15/2015   PLT 347 10/15/2015      Component Value Date/Time   NA 140 10/15/2015 1630   K 4.8 10/15/2015 1630   CL 101 10/15/2015 1630   CO2 25 10/15/2015 1630   GLUCOSE 91 10/15/2015 1630   GLUCOSE 98 07/19/2014 0456   BUN 12 10/15/2015 1630   CREATININE 1.14 10/15/2015 1630   CALCIUM 9.4 10/15/2015 1630   PROT 6.9 10/15/2015 1630   ALBUMIN 4.4 10/15/2015 1630   AST 18 10/15/2015 1630   ALT 23 10/15/2015 1630   ALKPHOS 40 10/15/2015 1630    BILITOT 0.4 10/15/2015 1630   GFRNONAA 77 10/15/2015 1630   GFRAA 89 10/15/2015 1630   No results found for: CHOL, HDL, LDLCALC, LDLDIRECT, TRIG, CHOLHDL No results found for: HGBA1C Lab Results  Component Value Date   VITAMINB12 422 11/22/2013   Lab Results  Component Value Date   TSH 0.605 10/15/2015      ASSESSMENT AND PLAN 48 y.o. year old male  has a past medical history of Multiple sclerosis ([place]) (2007). here with:  1. Relapsing remitting Multiple Sclerosis -[name], 2nd round in September 2018 -MRI of the brain December 2019 Advanced changes of Multiple Sclerosis appears stable from the prior study.  No new or acute lesions.  Extensive cerebral volume loss bilaterally mostly in the parietal lobes is stable. -I have completed the form for standing [name] labs at nearest [place], gave patient a copy, faxed to [facility] in [place], [place]  2. Gait Abnormality  -Continue Amypyra -Baclofen 10 mg 3 times a day  3. Depression -Cymbalta 60 mg daily, provided refill  4. Insomnia  -Will refill the Ambien he takes, for sleep. -5 mg at bedtime as needed for sleep   I spent 25 minutes with the patient. 50% of this time was spent discussing his plan of care   Evangeline Dakin, DNP 11/01/2018, 9:57 AM [facility] [address], [place], [place] [address] [phone]

## 2018-11-01 ENCOUNTER — Ambulatory Visit (INDEPENDENT_AMBULATORY_CARE_PROVIDER_SITE_OTHER): Payer: Medicare Other | Admitting: Neurology

## 2018-11-01 ENCOUNTER — Telehealth: Payer: Self-pay | Admitting: Neurology

## 2018-11-01 ENCOUNTER — Other Ambulatory Visit: Payer: Self-pay

## 2018-11-01 ENCOUNTER — Encounter: Payer: Self-pay | Admitting: Neurology

## 2018-11-01 VITALS — BP 120/76 | HR 72 | Temp 98.3°F | Ht 71.0 in | Wt 202.0 lb

## 2018-11-01 DIAGNOSIS — G35 Multiple sclerosis: Secondary | ICD-10-CM

## 2018-11-01 DIAGNOSIS — Z79899 Other long term (current) drug therapy: Secondary | ICD-10-CM | POA: Diagnosis not present

## 2018-11-01 DIAGNOSIS — F32A Depression, unspecified: Secondary | ICD-10-CM

## 2018-11-01 DIAGNOSIS — R269 Unspecified abnormalities of gait and mobility: Secondary | ICD-10-CM

## 2018-11-01 DIAGNOSIS — F329 Major depressive disorder, single episode, unspecified: Secondary | ICD-10-CM | POA: Diagnosis not present

## 2018-11-01 MED ORDER — ZOLPIDEM TARTRATE 5 MG PO TABS: 5.0000 mg | ORAL_TABLET | Freq: Every evening | ORAL | 5 refills | Status: DC | PRN

## 2018-11-01 MED ORDER — DULOXETINE HCL 60 MG PO CPEP: 60.0000 mg | ORAL_CAPSULE | Freq: Every day | ORAL | 3 refills | Status: DC

## 2018-11-01 NOTE — Progress Notes (Signed)
I have reviewed and agreed above plan. 

## 2018-11-01 NOTE — Telephone Encounter (Signed)
Called and spoke to [place].  Relayed that ambien and duloxetine will be filled at his [facility] order pharmacy.  Please cancel this from [place].  She relayed she would.  I did not get her name.

## 2018-11-01 NOTE — Telephone Encounter (Signed)
pts wife called in and stated pts meds DULoxetine (CYMBALTA) 60 MG capsule and zolpidem (AMBIEN) 5 MG tablet were sent to incorrect pharmacy , they need to be sent to [place], [place]. And pt had a issue getting labs done , its not suppose to go through insurance. Please advise

## 2018-11-01 NOTE — Patient Instructions (Signed)
Please see attached paperwork for [name] routine monitoring labs at your nearest [facility].

## 2018-11-01 NOTE — Telephone Encounter (Signed)
I called and spoke to wife.  She stated the [facility] is closed on [address] in [place], [place].  She had labs drawn at outpt lab at hospital.  She states that form was given to them, wife works in billing and thought this would be ok.  I explained that since [facility] lab closed.  [facility] are doing lemtrada labs and billed to [facility].  (form is given to pt) with this information.  She was given other [facility] tele # on [address] in [place].  She will check this out for next time.  She also stated wanted the medications to go to [facility] mail order.

## 2018-11-01 NOTE — Telephone Encounter (Signed)
I resent the Ambien prescription to [facility]. Please call and revoke the prescription that was sent to [facility] in [place] (per the patient).

## 2018-11-02 ENCOUNTER — Other Ambulatory Visit: Payer: Self-pay | Admitting: Neurology

## 2018-11-02 ENCOUNTER — Telehealth: Payer: Self-pay | Admitting: *Deleted

## 2018-11-02 DIAGNOSIS — G35 Multiple sclerosis: Secondary | ICD-10-CM | POA: Diagnosis not present

## 2018-11-02 DIAGNOSIS — R296 Repeated falls: Secondary | ICD-10-CM | POA: Diagnosis not present

## 2018-11-02 DIAGNOSIS — K5900 Constipation, unspecified: Secondary | ICD-10-CM | POA: Diagnosis not present

## 2018-11-02 DIAGNOSIS — M549 Dorsalgia, unspecified: Secondary | ICD-10-CM | POA: Diagnosis not present

## 2018-11-02 NOTE — Telephone Encounter (Signed)
Received lab results.  Placed on SS/NP desk.

## 2018-11-02 NOTE — Telephone Encounter (Signed)
[name] labs collected on07/13/20  [facility]    CBC w/ Diff:wnl except: MCHC 31.9, MPV 10.5  UA:wnl  ALT+AST+TBILI: wnl  TSH: 1.18- wnl  Creatinine: 0.92- wnl  Results provided to Dr. Marilynn Latino for review. They were also sent to scanning.    Additional Documentation

## 2018-11-05 DIAGNOSIS — R296 Repeated falls: Secondary | ICD-10-CM | POA: Diagnosis not present

## 2018-11-05 DIAGNOSIS — M549 Dorsalgia, unspecified: Secondary | ICD-10-CM | POA: Diagnosis not present

## 2018-11-05 DIAGNOSIS — G35 Multiple sclerosis: Secondary | ICD-10-CM | POA: Diagnosis not present

## 2018-11-05 DIAGNOSIS — K5900 Constipation, unspecified: Secondary | ICD-10-CM | POA: Diagnosis not present

## 2018-11-12 DIAGNOSIS — R269 Unspecified abnormalities of gait and mobility: Secondary | ICD-10-CM | POA: Diagnosis not present

## 2018-12-06 DIAGNOSIS — R112 Nausea with vomiting, unspecified: Secondary | ICD-10-CM | POA: Diagnosis not present

## 2018-12-06 DIAGNOSIS — E860 Dehydration: Secondary | ICD-10-CM | POA: Diagnosis not present

## 2018-12-06 DIAGNOSIS — R1110 Vomiting, unspecified: Secondary | ICD-10-CM | POA: Diagnosis not present

## 2018-12-06 DIAGNOSIS — K529 Noninfective gastroenteritis and colitis, unspecified: Secondary | ICD-10-CM | POA: Diagnosis not present

## 2018-12-06 DIAGNOSIS — Z79899 Other long term (current) drug therapy: Secondary | ICD-10-CM | POA: Diagnosis not present

## 2018-12-06 DIAGNOSIS — R197 Diarrhea, unspecified: Secondary | ICD-10-CM | POA: Diagnosis not present

## 2018-12-06 DIAGNOSIS — L298 Other pruritus: Secondary | ICD-10-CM | POA: Diagnosis not present

## 2018-12-06 DIAGNOSIS — R000 Tachycardia, unspecified: Secondary | ICD-10-CM | POA: Diagnosis not present

## 2018-12-06 DIAGNOSIS — L509 Urticaria, unspecified: Secondary | ICD-10-CM | POA: Diagnosis not present

## 2018-12-06 DIAGNOSIS — G35 Multiple sclerosis: Secondary | ICD-10-CM | POA: Diagnosis not present

## 2018-12-09 DIAGNOSIS — R112 Nausea with vomiting, unspecified: Secondary | ICD-10-CM | POA: Diagnosis not present

## 2018-12-09 DIAGNOSIS — Z209 Contact with and (suspected) exposure to unspecified communicable disease: Secondary | ICD-10-CM | POA: Diagnosis not present

## 2018-12-09 DIAGNOSIS — L509 Urticaria, unspecified: Secondary | ICD-10-CM | POA: Diagnosis not present

## 2018-12-09 DIAGNOSIS — Z6827 Body mass index (BMI) 27.0-27.9, adult: Secondary | ICD-10-CM | POA: Diagnosis not present

## 2019-01-05 DIAGNOSIS — Z20828 Contact with and (suspected) exposure to other viral communicable diseases: Secondary | ICD-10-CM | POA: Diagnosis not present

## 2019-01-05 DIAGNOSIS — R112 Nausea with vomiting, unspecified: Secondary | ICD-10-CM | POA: Diagnosis not present

## 2019-01-05 DIAGNOSIS — Z209 Contact with and (suspected) exposure to unspecified communicable disease: Secondary | ICD-10-CM | POA: Diagnosis not present

## 2019-01-05 DIAGNOSIS — L509 Urticaria, unspecified: Secondary | ICD-10-CM | POA: Diagnosis not present

## 2019-02-02 ENCOUNTER — Telehealth: Payer: Self-pay | Admitting: Neurology

## 2019-02-02 NOTE — Telephone Encounter (Signed)
Pt wife(on DPR) states it has been 2 months since pt has had blood work and he is supposed to have it done every month.  Wife states she was told there was something that was supposed to be done in the office but she has not heard from anyone about it.  Wife is asking for a call re: what can be done for pt to come in for blood work

## 2019-02-02 NOTE — Telephone Encounter (Signed)
I have been working with Optometrist at [place] to get the lab issues resolved.  I was informed on 01/24/2019 that patients should be receiving a call this week to schedule lab draws.  [employer] will be the company responsible.  Caffie Pinto, BSN, RN, [facility] Senior Infusion Support Manager [facility] Patient Support Services [address] [place] [address] Mobile : [phone] [email or web address]  Per Junie Panning, she will follow up on this patient's orders.  I returned the call to the patient's wife on DPR to provide her with this update.  I requested her to call me back in one week if they do not hear from [facility] to schedule his appt.  She verbalized understanding.

## 2019-02-15 DIAGNOSIS — Z23 Encounter for immunization: Secondary | ICD-10-CM | POA: Diagnosis not present

## 2019-03-04 ENCOUNTER — Other Ambulatory Visit: Payer: Self-pay | Admitting: Neurology

## 2019-03-07 ENCOUNTER — Other Ambulatory Visit: Payer: Self-pay | Admitting: *Deleted

## 2019-03-07 MED ORDER — OXYBUTYNIN CHLORIDE 5 MG PO TABS: 5.0000 mg | ORAL_TABLET | Freq: Three times a day (TID) | ORAL | 3 refills | Status: DC | PRN

## 2019-03-08 ENCOUNTER — Other Ambulatory Visit: Payer: Self-pay | Admitting: *Deleted

## 2019-03-08 MED ORDER — LACTULOSE 10 GM/15ML PO SOLN: ORAL | 3 refills | Status: DC

## 2019-03-15 ENCOUNTER — Encounter: Payer: Self-pay | Admitting: Neurology

## 2019-04-04 ENCOUNTER — Telehealth: Payer: Self-pay | Admitting: *Deleted

## 2019-04-04 NOTE — Telephone Encounter (Signed)
[name] labs collected on11/24/2020:   CBC w/ Diff:wnl  UA:wnl  ALT+AST+TBili: wnl  TSH: 0.752-wnl  Creatinine: 0.99-wnl  Results provided to Dr. Krista Blue for review. They were also sent to scanning.

## 2019-05-04 ENCOUNTER — Ambulatory Visit: Payer: Medicare Other | Admitting: Neurology

## 2019-05-16 ENCOUNTER — Encounter: Payer: Self-pay | Admitting: Nurse Practitioner

## 2019-05-19 ENCOUNTER — Other Ambulatory Visit: Payer: Self-pay | Admitting: *Deleted

## 2019-05-19 ENCOUNTER — Telehealth: Payer: Self-pay | Admitting: Neurology

## 2019-05-19 MED ORDER — ZOLPIDEM TARTRATE 5 MG PO TABS: 5.0000 mg | ORAL_TABLET | Freq: Every evening | ORAL | 5 refills | Status: DC | PRN

## 2019-05-19 MED ORDER — LACTULOSE 10 GM/15ML PO SOLN: ORAL | 3 refills | Status: DC

## 2019-05-19 MED ORDER — BACLOFEN 10 MG PO TABS: 10.0000 mg | ORAL_TABLET | Freq: Three times a day (TID) | ORAL | 3 refills | Status: DC

## 2019-05-19 NOTE — Telephone Encounter (Signed)
I called wife and relayed that Dr. Krista Blue did place prescription for Chronulac to [facility], it shows it was received.

## 2019-05-19 NOTE — Telephone Encounter (Signed)
I have refilled his Lactulose as needed rx, please let him know

## 2019-05-19 NOTE — Addendum Note (Signed)
Addended by: Marcial Pacas on: 05/19/2019 04:05 PM   Modules accepted: Orders

## 2019-05-19 NOTE — Telephone Encounter (Signed)
Haisley,Lawrence Lamb(wife on DPR) has called re: pt's lactulose (Dix) 10 GM/15ML solution.  Wife states since December they have been waiting on resolution about this medication.  Pt is in need of a refill thru [facility].  Please call wife

## 2019-05-19 NOTE — Telephone Encounter (Signed)
New prescriptions have been sent to [facility] for baclofen, zolpidem and lactulose. The will mark the lactulose urgent and get it ready for shipment.

## 2019-05-30 ENCOUNTER — Telehealth: Payer: Self-pay | Admitting: *Deleted

## 2019-05-30 NOTE — Telephone Encounter (Signed)
[name] labs collected on1/25/21:   CBC w/ Diff:wnl  UA:wnl  ALT+AST+TBili: wnl  TSH:0.768-wnl  Creatinine: 0.98-wnl  Results provided to Dr. Krista Blue for review. They were also sent to scanning.

## 2019-06-27 ENCOUNTER — Ambulatory Visit (INDEPENDENT_AMBULATORY_CARE_PROVIDER_SITE_OTHER): Payer: Medicare Other | Admitting: Neurology

## 2019-06-27 ENCOUNTER — Encounter: Payer: Self-pay | Admitting: Neurology

## 2019-06-27 ENCOUNTER — Other Ambulatory Visit: Payer: Self-pay

## 2019-06-27 VITALS — BP 138/83 | HR 102 | Temp 97.9°F | Ht 71.0 in | Wt 203.5 lb

## 2019-06-27 DIAGNOSIS — R269 Unspecified abnormalities of gait and mobility: Secondary | ICD-10-CM | POA: Insufficient documentation

## 2019-06-27 DIAGNOSIS — F329 Major depressive disorder, single episode, unspecified: Secondary | ICD-10-CM

## 2019-06-27 DIAGNOSIS — R5383 Other fatigue: Secondary | ICD-10-CM

## 2019-06-27 DIAGNOSIS — G35 Multiple sclerosis: Secondary | ICD-10-CM | POA: Diagnosis not present

## 2019-06-27 DIAGNOSIS — F32A Depression, unspecified: Secondary | ICD-10-CM

## 2019-06-27 MED ORDER — DULOXETINE HCL 60 MG PO CPEP: 60.0000 mg | ORAL_CAPSULE | Freq: Every day | ORAL | 4 refills | Status: DC

## 2019-06-27 MED ORDER — DALFAMPRIDINE ER 10 MG PO TB12: 10.0000 mg | ORAL_TABLET | Freq: Two times a day (BID) | ORAL | 4 refills | Status: DC

## 2019-06-27 NOTE — Progress Notes (Signed)
PATIENT: Lawrence Lamb DOB: 10-03-1970  REASON FOR VISIT: follow up HISTORY FROM: patient  HISTORY OF PRESENT ILLNESS: Today 06/27/19  HISTORY  HISTORY OF PRESENT ILLNESS:[name] is a 49 years old right-handed [place] American male, follow-up for relapsing remediating multiple sclerosis  He moved from [place] to [place] in 2013, was previously under the care of [facility] specialist Dr. [name],  He was diagnosed with relapsing remitting multiple sclerosis in early 2007, presenting with right face, right leg weakness, has mild gait difficulty, diagnosis was confirmed by marked abnormal MRI of brain, and cervical lesions   He was initially treated with Betaseron for 18 months, clinically he was stable, but there was enhancing MRI lesions on Betaseron, he developed positive interferon antibodies. Tysabri was started since February 2009, initially every 4 weeks, later stretching to every 6 weeks, JC virus antibody was negative in September 2009 and April 2010, but JC virus antibody began to be positive since September 2010,he tolerates the [name] infusion very well from Feb 2009 till Jan 2013. He has repeat MRI of brain every 6 months.   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 He was treated withTecfidera from January 24th, 2014 for 6 months, but he continued to have slow progressive worsening gait difficulty, fell multiple times, had few rounds of steroid. Abnormal visual evoked response test secondary to prolongation of the P100 waveform on the left.   He nowcomplains of constant gait difficulty, bilateral lower extremity spasticity, also complains of urinary incontinence, double vision, he could no longer driving, also complains of worsening memory trouble, difficulty focusing, could not take care  of his young children by himself.   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,index value is 4.15 in June 2014, 2.66 in 11/22/2013  I have discussed treatment options with patient and his wife, he had progressive worsening gait difficulty, memory trouble, vision problem since he was off [name], taking [name] treatment, His symptoms were relatively stable while taking [name] is 10/998 chance of PML, He restarted on [name] infusion since Oct 14th 2014 UPDATE Feb 11th 2016:YY His walking has much improved with Ampyra twice a day, no significant side effect, he did not had MRI of brain, cervical spine as previously scheduled,   UPDATE October 10 2014:YY He was admitted to hospital March 28-30 2016 for flulike illness, he had much worsening gait difficulty ever since, now rely on his forefoot cane, he also complains of worsening nocturia, wake up multiple times during night, depressed he could not contribute much to his family, he is still waiting for social disability applications.   Update August the second 2016:YY He continue complains of gait difficulty, right worse than left, rely on his cane, Effexor has been helpful for his depression, complains of worsening memory trouble Anti-[name] antibody was negative. He and his wife concerned about the side effect of lemtrada, wants to stay on Tysabri.  I have reviewed and compared MRI scan in June 2016 at [facility] to February 2016 MRIs, multiple supratentorium lesions, T1 black holes, no contrast enhancement, no significant change compared to previous scans,  UPDATE Feb 21 2015:YY He started to feel increaesd weakness since Nov 1st, right leg worse than left, he took a shower, his leg gave out, fell down, difficult to get out of bathtub, he also has chronic constipations   UPDATE May 09 2015:YY  He was treated with one course of Medrol pack, his walking has improved some, but overall has slow progress in gait difficulty, worsening  memory trouble, he can no longer babysit his young children at home, mild depression, Effexor has made his depression worse, worsening constipations, failed to improve by over-the-counter medications  UPDATE August 13 2015:[name] [name] infusion was in July 09 2015, he was evaluated by Dr. Felecia Shelling in March 20 eighth 2017, he is at high risk to develop complication with continued [name] infusion, in addition, he continue have progressive worsening gait difficulty on [name], frequent flareups, we decided to proceed with [name]:YY He had first dose of lemtrada from July 10-14th, tolerate it well. I have reviewed hospital admission on November 04 2015 to [facility],  Patient woke up November 03 2015 was found to be confused, had a fever of 102, he just finished [facility] infusion on July 14th2017, upon presentation, blood pressure was 119/54, heart rate of 79, temperature 99.2, patient was documented as low alert, no skin rash, blood culture was sent, he was treated with Levaquin, next  CT of the chest without contrast showed minimum bilateral dependent subsegmental atelectasis, no acute abnormality, MRI of the brain with and without contrast on November 05 2015 showed advanced chronic demyelinating disease throughout the brain, unchanged from January 2017, no new lesion  X-rays was negative for pneumonia,  EKG showed sinus rhythm with heart rate of 97 Laboratory evaluation, glucose 94, creatinine 0.91, and Was mildly elevated 13 point 9, CPK was 282, WBC was 7.8, hemoglobin was 13 point 5, neutrophil was 6 5.6%, influenza a antibody was positive, UA showed negative leukoesterase, negative nitrate,  He was treated with antibiotics, IV solution, his symptoms quickly improved over 4 days, he is now at his baseline, ambulate with a walker, he does not drive, his sister stays with him during the day, he watches TV, He complains of worsening visual problem, he was  seen by optometrist, he could see close vision, but could not read far away.   UPDATE October 02 2016:YY He denies any significant change, he ambulate with a cane, he continue have significant memory loss, denies significant depression, mild fatigue, he attend adult day program. He continues to have mild to moderate constipation, bladder urgency.  He will have repeat Lematrada infusion in July 2018, 2 days following previous infusion on November 03 2016, he was admitted to the hospital for fever, symptoms quickly improved with antibiotic treatment, but UA showed negative leukoesterase, negative nitrate, CT chest showed minimum bilateral dependent actelesis, no acute abnormality He has monthly lab by [facility] at the middle of month. UPDATE Sept 6th 2018: He tolerates second round ofLemetradawell, finished infusion last week, no fever noted.He has baseline gait abnormality, no significant worsening, alsocomplains of mild depression anxiety insomnia taking melatonin, Ambien as needed  UPDATE September 30 2017:  MRI of the brain 04/03/2017 unchanged with severe burden of demyelinating disease without active demyelinating lesions when compared to MRI in 2017.  He is overall fairly stable, laboratory evaluations was within normal range, CD4 count is trending up, has stopped the Valtrex treatment, he fell showering April 2019 inwith 2 broken ribs on the left side, now recovered, there is no significant change in his gait abnormality  Update June 27, 2019: He is accompanied by his wife at today's clinical visit, continue complains of significant gait abnormality, fell every few weeks, ambulate with a forefoot cane, continue to feel with chronic fatigue,  depression, insomnia, is also under the care of [facility] for PTSD, recently stopped the Ambien, started on Seroquel in February 2021, he can sleep better.  He spent most of the day sitting, watching TV, reading books, also has memory loss, rely on his  wife to fix his medication box.  REVIEW OF SYSTEMS: Out of a complete 14 system review of symptoms, the patient complains only of the following symptoms, and all other reviewed systems are negative.  Gait abnormal, weakness, insomnia, confusion  ALLERGIES: No Known Allergies  HOME MEDICATIONS: Outpatient Medications Prior to Visit  Medication Sig Dispense Refill  . Alemtuzumab ([name]) 12 MG/1.2ML SOLN Inject into the vein.    . baclofen (LIORESAL) 10 MG tablet Take 1 tablet (10 mg total) by mouth 3 (three) times daily. 270 tablet 3  . Cholecalciferol (VITAMIN D PO) Take by mouth 2 (two) times daily. Takes 2000units daily    . dalfampridine 10 MG TB12 TAKE 1 TABLET (10MG ) BY MOUTH TWICE A DAY 60 tablet 11  . Docusate Calcium (STOOL SOFTENER PO) Take by mouth 2 (two) times daily as needed.    . DULoxetine (CYMBALTA) 60 MG capsule Take 1 capsule (60 mg total) by mouth daily. 90 capsule 3  . lactulose (CHRONULAC) 10 GM/15ML solution TAKE 15 MLS (10 GM TOTAL) BY MOUTH 3 (THREE) TIMES FOR CONSTIPATION. 4050 mL 3  . loratadine (CLARITIN) 10 MG tablet Take 10 mg by mouth daily.    . multivitamin-iron-minerals-folic acid (CENTRUM) chewable tablet Chew 1 tablet by mouth daily.    Marland Kitchen omega-3 acid ethyl esters (LOVAZA) 1 G capsule Take 1,000 mg by mouth 2 (two) times daily.    Marland Kitchen oxybutynin (DITROPAN) 5 MG tablet Take 1 tablet (5 mg total) by mouth every 8 (eight) hours as needed for bladder spasms. TAKE ONE TABLET BY MOUTH EVERY 8 HOURS AS NEEDED FOR BLADDER SPASMS 270 tablet 3  . zolpidem (AMBIEN) 5 MG tablet Take 1 tablet (5 mg total) by mouth at bedtime as needed. for sleep 30 tablet 5   No facility-administered medications prior to visit.    PAST MEDICAL HISTORY: Past Medical History:  Diagnosis Date  . Multiple sclerosis ([place]) 2007    PAST SURGICAL HISTORY: Past Surgical History:  Procedure Laterality Date  . APPENDECTOMY  49 years old  . VASECTOMY  08/2013    FAMILY  HISTORY: Family History  Problem Relation Age of Onset  . High blood pressure Mother   . High blood pressure Father     SOCIAL HISTORY: Social History   Socioeconomic History  . Marital status: Married    Spouse name: Eida  . Number of children: 2  . Years of education: 42  . Highest education level: Not on file  Occupational History    Comment: Disabled  Tobacco Use  . Smoking status: Never Smoker  . Smokeless tobacco: Never Used  Substance and Sexual Activity  . Alcohol use: No  . Drug use: No  . Sexual activity: Not on file  Other Topics Concern  . Not on file  Social History Narrative   Patient lives at home with his wife [name]). Patient has two children. Patient is disabled.   Right handed.   Caffeine- one cup daily.   Social Determinants of Health   Financial Resource Strain:   . Difficulty of Paying Living Expenses: Not on file  Food Insecurity:   . Worried About Charity fundraiser in the Last Year: Not on file  . Ran  Out of Food in the Last Year: Not on file  Transportation Needs:   . Lack of Transportation (Medical): Not on file  . Lack of Transportation (Non-Medical): Not on file  Physical Activity:   . Days of Exercise per Week: Not on file  . Minutes of Exercise per Session: Not on file  Stress:   . Feeling of Stress : Not on file  Social Connections:   . Frequency of Communication with Friends and Family: Not on file  . Frequency of Social Gatherings with Friends and Family: Not on file  . Attends Religious Services: Not on file  . Active Member of Clubs or Organizations: Not on file  . Attends Archivist Meetings: Not on file  . Marital Status: Not on file  Intimate Partner Violence:   . Fear of Current or Ex-Partner: Not on file  . Emotionally Abused: Not on file  . Physically Abused: Not on file  . Sexually Abused: Not on file      PHYSICAL EXAM  Vitals:   06/27/19 1517  BP: 138/83  Pulse: (!) 102  Temp: 97.9 F (36.6 C)   Weight: 203 lb 8 oz (92.3 kg)  Height: 5\' 11"  (1.803 m)   Body mass index is 28.38 kg/m.  PHYSICAL EXAMNIATION:  Gen: NAD, conversant, well nourised, well groomed                     Cardiovascular: Regular rate rhythm, no peripheral edema, warm, nontender. Eyes: Conjunctivae clear without exudates or hemorrhage Neck: Supple, no carotid bruits. Pulmonary: Clear to auscultation bilaterally   NEUROLOGICAL EXAM:  MENTAL STATUS: Speech/cognition:    Speech is slow, slow reaction time, Cognition:     Orientation to time, place and person     Normal recent and remote memory     Normal Attention span and concentration     Normal Language, naming, repeating,spontaneous speech     Fund of knowledge   CRANIAL NERVES: CN II: Visual fields are full to confrontation.  Pupils are round equal and briskly reactive to light. CN III, IV, VI: extraocular movement are normal. No ptosis. CN V: Facial sensation is intact CN VII: Face is symmetric with normal eye closure and smile. CN VIII: Hearing is normal to casual conversation CN IX, X: Phonation is normal. CN XI: Head turning and shoulder shrug are intact CN XII: Tongue is midline with normal movements and no atrophy.  MOTOR: Moderate bilateral lower extremity spasticity, right worse than left, mild left upper extremity spasticity  Fixation of left upper extremity upon rapid rotating movement, mild weakness, mild bilateral hip flexion weakness, right worse than left.  REFLEXES: Reflexes are hyperreflexia, and symmetric at the biceps, triceps, knees, and ankles.  SENSORY: Length dependent decreased light touch, vibratory sensation to knee level,  COORDINATION: Rapid alternating movements and fine finger movements are intact. There is no dysmetria on finger-to-nose and heel-knee-shin.    GAIT/STANCE: He rely on his forefoot cane to get up from seated position, stiff, dragging his right leg.  DIAGNOSTIC DATA (LABS, IMAGING,  TESTING) - I reviewed patient records, labs, notes, testing and imaging myself where available.  Lab Results  Component Value Date   WBC 8.2 10/15/2015   HGB 14.1 10/15/2015   HCT 42.4 10/15/2015   MCV 90 10/15/2015   PLT 347 10/15/2015      Component Value Date/Time   NA 140 10/15/2015 1630   K 4.8 10/15/2015 1630   CL 101 10/15/2015  1630   CO2 25 10/15/2015 1630   GLUCOSE 91 10/15/2015 1630   GLUCOSE 98 07/19/2014 0456   BUN 12 10/15/2015 1630   CREATININE 1.14 10/15/2015 1630   CALCIUM 9.4 10/15/2015 1630   PROT 6.9 10/15/2015 1630   ALBUMIN 4.4 10/15/2015 1630   AST 18 10/15/2015 1630   ALT 23 10/15/2015 1630   ALKPHOS 40 10/15/2015 1630   BILITOT 0.4 10/15/2015 1630   GFRNONAA 77 10/15/2015 1630   GFRAA 89 10/15/2015 1630   No results found for: CHOL, HDL, LDLCALC, LDLDIRECT, TRIG, CHOLHDL No results found for: HGBA1C Lab Results  Component Value Date   [id number] 11/22/2013   Lab Results  Component Value Date   TSH 0.605 10/15/2015      ASSESSMENT AND PLAN 49 y.o. year old male   Relapsing remitting Multiple Sclerosis -[name], 2nd round in September 2018 Most recent of MRI of the brain was in December 2019 Advanced changes of Multiple Sclerosis appears stable from the prior study.  No new or acute lesions.  Extensive cerebral volume loss bilaterally mostly in the parietal lobes is stable. We will repeat MRI of the brain with without contrast,  Gait Abnormality  Continue Amypyra Baclofen 10 mg 3 times a day Refer him to Home physical therapy  Depression/PTSD insomnia Improved with Seroquel 25 mg every night, Continue Cymbalta 60 mg daily  Urinary urgency, chronic constipation, Continue Ditropan 5 mg every 8 hours Lactulose as needed  Marcial Pacas, M.D. Ph.D.  [facility] [place], [place] [address] Phone: [phone] Fax:      [phone]

## 2019-07-05 ENCOUNTER — Telehealth: Payer: Self-pay | Admitting: Neurology

## 2019-07-05 NOTE — Telephone Encounter (Signed)
Patient is scheduled at [facility] for 07/14/19 arrival time is 3:00 pm. Spoke to patient wife who is on DPR and she is aware of time and day.

## 2019-07-05 NOTE — Telephone Encounter (Signed)
Medicare no auth.I left a voicemail for patient to call back to see if he would like his MRI at [facility] and what time and day does he like. If he calls back please ask him thank you!

## 2019-07-07 ENCOUNTER — Other Ambulatory Visit: Payer: Self-pay | Admitting: *Deleted

## 2019-07-07 ENCOUNTER — Telehealth: Payer: Self-pay | Admitting: *Deleted

## 2019-07-07 DIAGNOSIS — R7989 Other specified abnormal findings of blood chemistry: Secondary | ICD-10-CM

## 2019-07-07 DIAGNOSIS — Z79899 Other long term (current) drug therapy: Secondary | ICD-10-CM

## 2019-07-07 NOTE — Telephone Encounter (Signed)
[name] labs collected on2/25/21:   CBC w/ Diff:wnl  UA:wnl  ALT+AST+TBili: wnl  TSH: 0.422 (L) -- last month 0.768  Creatinine: 0.93-wnl  Results provided to Dr. Krista Blue for review. They were also sent to scanning.  Due to the changes in his TSH, she has ordered a thyroid function panel. I spoke to his wife and he will come to our office next week to complete the labs. They are aware of our office hours.

## 2019-07-12 ENCOUNTER — Other Ambulatory Visit (INDEPENDENT_AMBULATORY_CARE_PROVIDER_SITE_OTHER): Payer: Self-pay

## 2019-07-12 DIAGNOSIS — R7989 Other specified abnormal findings of blood chemistry: Secondary | ICD-10-CM | POA: Diagnosis not present

## 2019-07-12 DIAGNOSIS — Z0289 Encounter for other administrative examinations: Secondary | ICD-10-CM

## 2019-07-12 DIAGNOSIS — Z79899 Other long term (current) drug therapy: Secondary | ICD-10-CM

## 2019-07-14 DIAGNOSIS — F329 Major depressive disorder, single episode, unspecified: Secondary | ICD-10-CM | POA: Diagnosis not present

## 2019-07-14 DIAGNOSIS — R269 Unspecified abnormalities of gait and mobility: Secondary | ICD-10-CM | POA: Diagnosis not present

## 2019-07-14 DIAGNOSIS — G35 Multiple sclerosis: Secondary | ICD-10-CM | POA: Diagnosis not present

## 2019-07-14 DIAGNOSIS — R5383 Other fatigue: Secondary | ICD-10-CM | POA: Diagnosis not present

## 2019-07-18 ENCOUNTER — Telehealth: Payer: Self-pay | Admitting: *Deleted

## 2019-07-18 LAB — TSH+T3+FREE T4+T3 FREE
Free T-3: 3.3 pg/mL
Free T4 by Dialysis: 0.77 ng/dL — ABNORMAL LOW
TSH: 0.69 uU/mL
Triiodothyronine (T-3), Serum: 150 ng/dL

## 2019-07-18 NOTE — Telephone Encounter (Signed)
Please call patient, Lawrence Lamb laboratory evaluation showed no significant abnormality  Repeat MRI of the brain with without contrast from [facility] on July 14, 2019 showed multiple MS lesions, no contrast-enhancement, no change compared to previous scan.

## 2019-07-18 NOTE — Telephone Encounter (Signed)
I spoke to his wife on [place]. She verbalized understanding of both his lab and MRI brain results.

## 2019-07-18 NOTE — Telephone Encounter (Signed)
[name] labs collected on 07/13/19:   CBC w/ Diff:wnl  UA:wnl  ALT+AST+TBili: wnl  TSH: 0.616 wnl  Creatinine: 0.98 wnl

## 2019-07-22 DIAGNOSIS — Z23 Encounter for immunization: Secondary | ICD-10-CM | POA: Diagnosis not present

## 2019-08-17 ENCOUNTER — Telehealth: Payer: Self-pay | Admitting: *Deleted

## 2019-08-17 NOTE — Telephone Encounter (Signed)
[name] labs collected on4/26/21:   CBC w/ Diff: MCHC 30.8 (L)  UA:wnl  ALT+AST+TBili: wnl  TSH: 0.499 wnl  Creatinine: 1.03-wnl  Results provided to Dr. Krista Blue for review. They were also sent to scanning.

## 2019-08-19 DIAGNOSIS — Z23 Encounter for immunization: Secondary | ICD-10-CM | POA: Diagnosis not present

## 2019-08-28 ENCOUNTER — Other Ambulatory Visit: Payer: Self-pay | Admitting: Neurology

## 2019-08-28 DIAGNOSIS — R269 Unspecified abnormalities of gait and mobility: Secondary | ICD-10-CM

## 2019-08-28 DIAGNOSIS — G35 Multiple sclerosis: Secondary | ICD-10-CM

## 2019-09-13 ENCOUNTER — Telehealth: Payer: Self-pay | Admitting: *Deleted

## 2019-09-13 NOTE — Telephone Encounter (Addendum)
Per vo by Dr. Krista Blue, please call patient to have him increase his water. I spoke to his wife, [name], who is aware of his lab results and plans to monitor his water intake.

## 2019-09-13 NOTE — Telephone Encounter (Signed)
[name] labs collected on5/24/2021:  CBC w/ Diff: wnl  UA:  Appearance: cloudy, abnormal Protein: 1+, abnormal Ketones: trace, abnormal  ALT+AST+TBili: wnl  TSH:0.453 wnl  Creatinine: 1.12-wnl  Results provided to Dr. Krista Blue for review. They were also sent to scanning.

## 2019-09-21 ENCOUNTER — Other Ambulatory Visit: Payer: Self-pay | Admitting: Neurology

## 2019-10-18 ENCOUNTER — Telehealth: Payer: Self-pay | Admitting: *Deleted

## 2019-10-18 NOTE — Telephone Encounter (Signed)
[name] labs collected on 10/12/2019:  CBC w/ Diff: wnl  UA: wnl  ALT+AST+TBili: wnl  TSH:0.725 wnl  Creatinine:1.03 wnl eGFR, Aficn Am: 98 wnl  Results provided to Dr. Krista Blue for review. They were also sent to scanning.

## 2019-11-11 ENCOUNTER — Telehealth: Payer: Self-pay | Admitting: *Deleted

## 2019-11-11 NOTE — Telephone Encounter (Addendum)
[name] labs collected on 10/2219:  CBC w/ Diff:wnl  UA: wnl  ALT+AST+TBili: wnl  TSH:0.699wnl  Creatinine:0.98 wnl eGFR, Aficn Am: 90 wnl  Results provided to Dr. Krista Blue for review. They were also sent to scanning.  Reviewed. Marcial Pacas, M.D. Ph.D.  [facility] [place], [place] [address] Phone: [phone] Fax:      [phone]

## 2019-12-15 ENCOUNTER — Encounter: Payer: Self-pay | Admitting: Neurology

## 2019-12-19 ENCOUNTER — Telehealth: Payer: Self-pay | Admitting: *Deleted

## 2019-12-19 NOTE — Telephone Encounter (Signed)
[name] labs collected on8/26/21:  CBC w/ Diff:wnl  UA: wnl  ALT+AST+TBili: wnl  TSH:0.602wnl  Creatinine:0.94 wnl eGFR, Aficn Am: 95 wnl  Results provided to Dr. Krista Blue for review. They were also sent to scanning.

## 2019-12-29 ENCOUNTER — Ambulatory Visit: Payer: Medicare Other | Admitting: Neurology

## 2020-01-16 ENCOUNTER — Telehealth: Payer: Self-pay | Admitting: *Deleted

## 2020-01-16 NOTE — Telephone Encounter (Signed)
Please contact patient, lab from [place] showed evidence of UTI, he should contact his PCP for repeat UA, potential treatment.  I have forward the result to his Pcp. [name], [place]

## 2020-01-16 NOTE — Telephone Encounter (Signed)
I spoke to his wife, [name] (on [place]). She will call his PCP today and make an appt for him to be seen.

## 2020-01-16 NOTE — Telephone Encounter (Signed)
[name] labs collected on9/23/21:  CBC w/ Diff:wnl  UA:  WBC Esterase 3+ (Abnormal) WBC >30 (Abnormal)  ALT+AST+TBili: wnl  TSH:0.707wnl  Creatinine:0.99 wnl eGFR, Aficn Am:103wnl  Results provided to Dr. Krista Blue for review. They were also sent to scanning.

## 2020-01-17 DIAGNOSIS — R300 Dysuria: Secondary | ICD-10-CM | POA: Diagnosis not present

## 2020-01-17 DIAGNOSIS — Z6828 Body mass index (BMI) 28.0-28.9, adult: Secondary | ICD-10-CM | POA: Diagnosis not present

## 2020-01-17 DIAGNOSIS — R32 Unspecified urinary incontinence: Secondary | ICD-10-CM | POA: Diagnosis not present

## 2020-01-17 DIAGNOSIS — R350 Frequency of micturition: Secondary | ICD-10-CM | POA: Diagnosis not present

## 2020-01-25 ENCOUNTER — Other Ambulatory Visit: Payer: Self-pay | Admitting: Neurology

## 2020-02-15 ENCOUNTER — Telehealth: Payer: Self-pay | Admitting: *Deleted

## 2020-02-15 NOTE — Telephone Encounter (Signed)
[name] labs collected on10/26/21:  CBC w/ Diff:wnl  UA: wnl  ALT+AST+TBili: wnl  TSH:0.698wnl  Creatinine:1.06 wnl eGFR, Aficn Am:94wnl  Results provided to Dr. Yan for review. They were also sent to scanning. 

## 2020-02-18 ENCOUNTER — Other Ambulatory Visit: Payer: Self-pay | Admitting: Neurology

## 2020-02-18 DIAGNOSIS — G35 Multiple sclerosis: Secondary | ICD-10-CM

## 2020-02-18 DIAGNOSIS — R269 Unspecified abnormalities of gait and mobility: Secondary | ICD-10-CM

## 2020-03-07 ENCOUNTER — Encounter: Payer: Self-pay | Admitting: Neurology

## 2020-03-07 ENCOUNTER — Ambulatory Visit (INDEPENDENT_AMBULATORY_CARE_PROVIDER_SITE_OTHER): Payer: Medicare Other | Admitting: Neurology

## 2020-03-07 ENCOUNTER — Other Ambulatory Visit: Payer: Self-pay

## 2020-03-07 VITALS — BP 155/87 | HR 99 | Ht 71.0 in | Wt 190.0 lb

## 2020-03-07 DIAGNOSIS — R5383 Other fatigue: Secondary | ICD-10-CM

## 2020-03-07 DIAGNOSIS — R269 Unspecified abnormalities of gait and mobility: Secondary | ICD-10-CM

## 2020-03-07 DIAGNOSIS — F32A Depression, unspecified: Secondary | ICD-10-CM

## 2020-03-07 DIAGNOSIS — G35 Multiple sclerosis: Secondary | ICD-10-CM

## 2020-03-07 MED ORDER — DULOXETINE HCL 60 MG PO CPEP
60.0000 mg | ORAL_CAPSULE | Freq: Every day | ORAL | 4 refills | Status: DC
Start: 2020-03-07 — End: 2020-03-07

## 2020-03-07 MED ORDER — BACLOFEN 10 MG PO TABS
10.0000 mg | ORAL_TABLET | Freq: Three times a day (TID) | ORAL | 3 refills | Status: DC
Start: 2020-03-07 — End: 2020-03-07

## 2020-03-07 MED ORDER — BACLOFEN 10 MG PO TABS
10.0000 mg | ORAL_TABLET | Freq: Three times a day (TID) | ORAL | 3 refills | Status: DC
Start: 2020-03-07 — End: 2021-08-22

## 2020-03-07 MED ORDER — DULOXETINE HCL 60 MG PO CPEP
60.0000 mg | ORAL_CAPSULE | Freq: Every day | ORAL | 4 refills | Status: DC
Start: 2020-03-07 — End: 2021-03-26

## 2020-03-07 MED ORDER — LACTULOSE 10 GM/15ML PO SOLN
ORAL | 3 refills | Status: DC
Start: 2020-03-07 — End: 2021-04-09

## 2020-03-07 MED ORDER — LACTULOSE 10 GM/15ML PO SOLN
ORAL | 3 refills | Status: DC
Start: 2020-03-07 — End: 2020-03-07

## 2020-03-07 MED ORDER — DALFAMPRIDINE ER 10 MG PO TB12: 10.0000 mg | ORAL_TABLET | Freq: Two times a day (BID) | ORAL | 4 refills | Status: DC

## 2020-03-07 MED ORDER — OXYBUTYNIN CHLORIDE 5 MG PO TABS: 5.0000 mg | ORAL_TABLET | Freq: Three times a day (TID) | ORAL | 11 refills | Status: DC | PRN

## 2020-03-07 MED ORDER — QUETIAPINE FUMARATE 25 MG PO TABS
25.0000 mg | ORAL_TABLET | Freq: Every day | ORAL | 4 refills | Status: DC
Start: 2020-03-07 — End: 2021-03-26

## 2020-03-07 MED ORDER — QUETIAPINE FUMARATE 25 MG PO TABS
25.0000 mg | ORAL_TABLET | Freq: Every day | ORAL | 4 refills | Status: DC
Start: 2020-03-07 — End: 2020-03-07

## 2020-03-07 NOTE — Progress Notes (Signed)
PATIENT: Lawrence Lamb DOB: [birth year]  REASON FOR VISIT: follow up HISTORY FROM: patient  HISTORY OF PRESENT ILLNESS: Today 03/07/20  HISTORY  HISTORY OF PRESENT ILLNESS:Mr. [name] is a 49 years old right-handed [place] American male, follow-up for relapsing remediating multiple sclerosis  He moved from [place] to [place] in 2013, was previously under the care of [facility] specialist Dr. [name],  He was diagnosed with relapsing remitting multiple sclerosis in early 2007, presenting with right face, right leg weakness, has mild gait difficulty, diagnosis was confirmed by marked abnormal MRI of brain, and cervical lesions   He was initially treated with Betaseron for 18 months, clinically he was stable, but there was enhancing MRI lesions on Betaseron, he developed positive interferon antibodies. Tysabri was started since February 2009, initially every 4 weeks, later stretching to every 6 weeks, JC virus antibody was negative in September 2009 and April 2010, but JC virus antibody began to be positive since September 2010,he tolerates the [name] infusion very well from Feb 2009 till Jan 2013. He has repeat MRI of brain every 6 months.   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 He was treated withTecfidera from January 24th, 2014 for 6 months, but he continued to have slow progressive worsening gait difficulty, fell multiple times, had few rounds of steroid. Abnormal visual evoked response test secondary to prolongation of the P100 waveform on the left.   He nowcomplains of constant gait difficulty, bilateral lower extremity spasticity, also complains of urinary incontinence, double vision, he could no longer driving, also complains of worsening memory trouble, difficulty focusing, could not take care  of his young children by himself.   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,index value is 4.15 in June 2014, 2.66 in 11/22/2013  I have discussed treatment options with patient and his wife, he had progressive worsening gait difficulty, memory trouble, vision problem since he was off [name], taking [name] treatment, His symptoms were relatively stable while taking [name] is 10/998 chance of PML, He restarted on [name] infusion since Oct 14th 2014 UPDATE Feb 11th 2016:YY His walking has much improved with Ampyra twice a day, no significant side effect, he did not had MRI of brain, cervical spine as previously scheduled,   UPDATE October 10 2014:YY He was admitted to hospital March 28-30 2016 for flulike illness, he had much worsening gait difficulty ever since, now rely on his forefoot cane, he also complains of worsening nocturia, wake up multiple times during night, depressed he could not contribute much to his family, he is still waiting for social disability applications.   Update August the second 2016:YY He continue complains of gait difficulty, right worse than left, rely on his cane, Effexor has been helpful for his depression, complains of worsening memory trouble Anti-[name] antibody was negative. He and his wife concerned about the side effect of lemtrada, wants to stay on Tysabri.  I have reviewed and compared MRI scan in June 2016 at [facility] to February 2016 MRIs, multiple supratentorium lesions, T1 black holes, no contrast enhancement, no significant change compared to previous scans,  UPDATE Feb 21 2015:YY He started to feel increaesd weakness since Nov 1st, right leg worse than left, he took a shower, his leg gave out, fell down, difficult to get out of bathtub, he also has chronic constipations   UPDATE May 09 2015:YY  He was treated with one course of Medrol pack, his walking has improved some, but overall has slow progress in gait difficulty, worsening  memory trouble, he can no longer babysit his young children at home, mild depression, Effexor has made his depression worse, worsening constipations, failed to improve by over-the-counter medications  UPDATE August 13 2015:[name] [name] infusion was in July 09 2015, he was evaluated by Dr. Felecia Shelling in March 20 eighth 2017, he is at high risk to develop complication with continued [name] infusion, in addition, he continue have progressive worsening gait difficulty on [name], frequent flareups, we decided to proceed with [name]:YY He had first dose of lemtrada from July 10-14th, 2017 tolerate it well. I have reviewed hospital admission on November 04 2015 to [facility],  Patient woke up November 03 2015 was found to be confused, had a fever of 102, he just finished Lematrada infusion on July 14th2017, upon presentation, blood pressure was 119/54, heart rate of 79, temperature 99.2, patient was documented as low alert, no skin rash, blood culture was sent, he was treated with Levaquin, next  CT of the chest without contrast showed minimum bilateral dependent subsegmental atelectasis, no acute abnormality, MRI of the brain with and without contrast on November 05 2015 showed advanced chronic demyelinating disease throughout the brain, unchanged from January 2017, no new lesion  X-rays was negative for pneumonia,  EKG showed sinus rhythm with heart rate of 97 Laboratory evaluation, glucose 94, creatinine 0.91, and Was mildly elevated 13 point 9, CPK was 282, WBC was 7.8, hemoglobin was 13 point 5, neutrophil was 6 5.6%, influenza a antibody was positive, UA showed negative leukoesterase, negative nitrate,  He was treated with antibiotics, IV solution, his symptoms quickly improved over 4 days, he is now at his baseline, ambulate with a walker, he does not drive, his sister stays with him during the day, he watches TV, He complains of worsening visual problem, he  was seen by optometrist, he could see close vision, but could not read far away.   UPDATE October 02 2016: He denies any significant change, he ambulate with a cane, he continue have significant memory loss, denies significant depression, mild fatigue, he attend adult day program. He continues to have mild to moderate constipation, bladder urgency.  He will have repeat Lematrada infusion in July 2018, 2 days following previous infusion on November 03 2016, he was admitted to the hospital for fever, symptoms quickly improved with antibiotic treatment, but UA showed negative leukoesterase, negative nitrate, CT chest showed minimum bilateral dependent actelesis, no acute abnormality He has monthly lab by [facility] at the middle of month.  UPDATE Sept 6th 2018: He tolerates second round ofLemetradawell in 2018, finished infusion last week, no fever noted.He has baseline gait abnormality, no significant worsening, alsocomplains of mild depression anxiety insomnia taking melatonin, Ambien as needed  UPDATE September 30 2017: MRI of the brain 04/03/2017 unchanged with severe burden of demyelinating disease without active demyelinating lesions when compared to MRI in 2017.  He is overall fairly stable, laboratory evaluations was within normal range, CD4 count is trending up, has stopped the Valtrex treatment, he fell showering April 2019 inwith 2 broken ribs on the left side, now recovered, there is no significant change in his gait abnormality  Update June 27, 2019: He is accompanied by his wife at today's clinical visit, continue complains of significant gait abnormality, fell every few weeks, ambulate with a forefoot cane, continue to feel  with chronic fatigue, depression, insomnia, is also under the care of [facility] for PTSD, recently stopped the Ambien, started on Seroquel in February 2021, he can sleep better.  He spent most of the day sitting, watching TV, reading books, also has memory loss,  rely on his wife to fix his medication box.  UPDATE Mar 07 2020: He was treated with UTI at end of September 2021, he is overall stable, continue have gait abnormality, mental slowing, fatigue, urinary urgency, on polypharmacy treatment, also carries a diagnosis depression, PTSD, Seroquel 25 mg every night has been helpful  REVIEW OF SYSTEMS: Out of a complete 14 system review of symptoms, the patient complains only of the following symptoms, and all other reviewed systems are negative.  Gait abnormal, weakness, insomnia, confusion  ALLERGIES: No Known Allergies  HOME MEDICATIONS: Outpatient Medications Prior to Visit  Medication Sig Dispense Refill  . Alemtuzumab ([name]) 12 MG/1.2ML SOLN Inject into the vein.    . baclofen (LIORESAL) 10 MG tablet Take 1 tablet (10 mg total) by mouth 3 (three) times daily. 270 tablet 3  . Cholecalciferol (VITAMIN D PO) Take by mouth 2 (two) times daily. Takes 2000units daily    . dalfampridine 10 MG TB12 TAKE 1 TABLET BY MOUTH TWICE A DAY 60 tablet 11  . Docusate Calcium (STOOL SOFTENER PO) Take by mouth 2 (two) times daily as needed.    . DULoxetine (CYMBALTA) 60 MG capsule Take 1 capsule (60 mg total) by mouth daily. 90 capsule 4  . lactulose (CHRONULAC) 10 GM/15ML solution TAKE 15 MLS (10 GM TOTAL) BY MOUTH 3 (THREE) TIMES FOR CONSTIPATION. 4050 mL 3  . loratadine (CLARITIN) 10 MG tablet Take 10 mg by mouth daily.    . multivitamin-iron-minerals-folic acid (CENTRUM) chewable tablet Chew 1 tablet by mouth daily.    Marland Kitchen omega-3 acid ethyl esters (LOVAZA) 1 G capsule Take 1,000 mg by mouth 2 (two) times daily.    Marland Kitchen oxybutynin (DITROPAN) 5 MG tablet Take 1 tablet (5 mg total) by mouth every 8 (eight) hours as needed for bladder spasms. TAKE ONE TABLET BY MOUTH EVERY 8 HOURS AS NEEDED FOR BLADDER SPASMS 270 tablet 3  . QUEtiapine (SEROQUEL) 25 MG tablet Take 25 mg by mouth at bedtime.     No facility-administered medications prior to visit.    PAST  MEDICAL HISTORY: Past Medical History:  Diagnosis Date  . Multiple sclerosis ([place]) 2007    PAST SURGICAL HISTORY: Past Surgical History:  Procedure Laterality Date  . APPENDECTOMY  49 years old  . VASECTOMY  08/2013    FAMILY HISTORY: Family History  Problem Relation Age of Onset  . High blood pressure Mother   . High blood pressure Father     SOCIAL HISTORY: Social History   Socioeconomic History  . Marital status: Married    Spouse name: [name]  . Number of children: 2  . Years of education: 59  . Highest education level: Not on file  Occupational History    Comment: Disabled  Tobacco Use  . Smoking status: Never Smoker  . Smokeless tobacco: Never Used  Substance and Sexual Activity  . Alcohol use: No  . Drug use: No  . Sexual activity: Not on file  Other Topics Concern  . Not on file  Social History Narrative   Patient lives at home with his wife [name]). Patient has two children. Patient is disabled.   Right handed.   Caffeine- one cup daily.   Social Determinants of  Health   Financial Resource Strain:   . Difficulty of Paying Living Expenses: Not on file  Food Insecurity:   . Worried About Charity fundraiser in the Last Year: Not on file  . Ran Out of Food in the Last Year: Not on file  Transportation Needs:   . Lack of Transportation (Medical): Not on file  . Lack of Transportation (Non-Medical): Not on file  Physical Activity:   . Days of Exercise per Week: Not on file  . Minutes of Exercise per Session: Not on file  Stress:   . Feeling of Stress : Not on file  Social Connections:   . Frequency of Communication with Friends and Family: Not on file  . Frequency of Social Gatherings with Friends and Family: Not on file  . Attends Religious Services: Not on file  . Active Member of Clubs or Organizations: Not on file  . Attends Archivist Meetings: Not on file  . Marital Status: Not on file  Intimate Partner Violence:   . Fear of Current  or Ex-Partner: Not on file  . Emotionally Abused: Not on file  . Physically Abused: Not on file  . Sexually Abused: Not on file      PHYSICAL EXAM  Vitals:   03/07/20 1128  BP: (!) 155/87  Pulse: 99  Weight: 190 lb (86.2 kg)  Height: 5\' 11"  (1.803 m)   Body mass index is 26.5 kg/m.  PHYSICAL EXAMNIATION:  Gen: NAD, conversant, well nourised, well groomed                     Cardiovascular: Regular rate rhythm, no peripheral edema, warm, nontender. Eyes: Conjunctivae clear without exudates or hemorrhage Neck: Supple, no carotid bruits. Pulmonary: Clear to auscultation bilaterally   NEUROLOGICAL EXAM:  MENTAL STATUS: Speech/cognition:    Speech is slow, slow reaction time, cooperative on examination   CRANIAL NERVES: CN II: Visual fields are full to confrontation.  Pupils are round equal and briskly reactive to light. CN III, IV, VI: extraocular movement are normal. No ptosis. CN V: Facial sensation is intact CN VII: Face is symmetric with normal eye closure and smile. CN VIII: Hearing is normal to casual conversation CN IX, X: Phonation is normal. CN XI: Head turning and shoulder shrug are intact   MOTOR: Moderate bilateral lower extremity spasticity, right worse than left, mild right upper extremity weakness, fixation of right upper extremity upon rapid rotating movement,  mild bilateral hip flexion weakness, right worse than left.  REFLEXES: Reflexes are hyperreflexia, and symmetric at the biceps, triceps, knees, and ankles.  SENSORY: Length dependent decreased light touch, vibratory sensation to knee level,  COORDINATION: Rapid alternating movements and fine finger movements are intact. There is no dysmetria on finger-to-nose and heel-knee-shin.    GAIT/STANCE: He rely on his forefoot cane to get up from seated position, stiff, dragging his right leg.  DIAGNOSTIC DATA (LABS, IMAGING, TESTING) - I reviewed patient records, labs, notes, testing and  imaging myself where available.  Lab Results  Component Value Date   WBC 8.2 10/15/2015   HGB 14.1 10/15/2015   HCT 42.4 10/15/2015   MCV 90 10/15/2015   PLT 347 10/15/2015      Component Value Date/Time   NA 140 10/15/2015 1630   K 4.8 10/15/2015 1630   CL 101 10/15/2015 1630   CO2 25 10/15/2015 1630   GLUCOSE 91 10/15/2015 1630   GLUCOSE 98 07/19/2014 0456   BUN 12  10/15/2015 1630   CREATININE 1.14 10/15/2015 1630   CALCIUM 9.4 10/15/2015 1630   PROT 6.9 10/15/2015 1630   ALBUMIN 4.4 10/15/2015 1630   AST 18 10/15/2015 1630   ALT 23 10/15/2015 1630   ALKPHOS 40 10/15/2015 1630   BILITOT 0.4 10/15/2015 1630   GFRNONAA 77 10/15/2015 1630   GFRAA 89 10/15/2015 1630   No results found for: CHOL, HDL, LDLCALC, LDLDIRECT, TRIG, CHOLHDL No results found for: HGBA1C Lab Results  Component Value Date   IEPPIRJJ88 416 11/22/2013   Lab Results  Component Value Date   TSH 0.605 10/15/2015      ASSESSMENT AND PLAN 49 y.o. year old male   Relapsing remitting Multiple Sclerosis -[name], finished 2nd dose in September 2018 Most recent of MRI of the brain was in December 2019 Advanced changes of Multiple Sclerosis appears stable from the prior study.  No new or acute lesions.  Extensive cerebral volume loss bilaterally mostly in the parietal lobes is stable. Repeat MRI of the brain cervical spine with without contrast at [facility]   Gait Abnormality  Continue Amypyra Baclofen 10 mg 3 times a day   Depression/PTSD insomnia Improved with Seroquel 25 mg every night, Continue Cymbalta 60 mg daily  Urinary urgency, chronic constipation, Continue Ditropan 5 mg every 8 hours Lactulose as needed  Marcial Pacas, M.D. Ph.D.  [facility] [place], [place] [address] Phone: [phone] Fax:      [phone]

## 2020-03-08 ENCOUNTER — Telehealth: Payer: Self-pay | Admitting: Neurology

## 2020-03-08 NOTE — Telephone Encounter (Signed)
Medicare no auth. Patient is scheduled at [facility] for 03/21/20 to arrive at 2:30 PM. I spoke with patient wife [name] and she is aware of time and day of time. I also gave her their number of [phone] incase she needed to r/s.

## 2020-03-12 ENCOUNTER — Telehealth: Payer: Self-pay | Admitting: *Deleted

## 2020-03-12 NOTE — Telephone Encounter (Signed)
[name] labs collected on11/18/21:  CBC w/ Diff:wnl  UA: Trace (abnormal)  ALT+AST+TBili: wnl  TSH:0.698wnl  Creatinine:1.07wnl eGFR, Aficn Am:93wnl  Results provided to Dr. Krista Blue for review. They were also sent to scanning.

## 2020-03-20 DIAGNOSIS — H469 Unspecified optic neuritis: Secondary | ICD-10-CM | POA: Diagnosis not present

## 2020-03-20 DIAGNOSIS — H52223 Regular astigmatism, bilateral: Secondary | ICD-10-CM | POA: Diagnosis not present

## 2020-03-20 DIAGNOSIS — H524 Presbyopia: Secondary | ICD-10-CM | POA: Diagnosis not present

## 2020-03-20 DIAGNOSIS — H5213 Myopia, bilateral: Secondary | ICD-10-CM | POA: Diagnosis not present

## 2020-03-21 ENCOUNTER — Ambulatory Visit (HOSPITAL_COMMUNITY): Payer: Medicare Other

## 2020-03-27 DIAGNOSIS — Z23 Encounter for immunization: Secondary | ICD-10-CM | POA: Diagnosis not present

## 2020-03-28 ENCOUNTER — Ambulatory Visit (HOSPITAL_COMMUNITY)
Admission: RE | Admit: 2020-03-28 | Discharge: 2020-03-28 | Disposition: A | Discharge status: Home / Self Care | Payer: Medicare Other | Source: Ambulatory Visit | Attending: Neurology | Admitting: Neurology

## 2020-03-28 ENCOUNTER — Other Ambulatory Visit: Payer: Self-pay

## 2020-03-28 DIAGNOSIS — F32A Depression, unspecified: Secondary | ICD-10-CM | POA: Diagnosis not present

## 2020-03-28 DIAGNOSIS — G35 Multiple sclerosis: Secondary | ICD-10-CM

## 2020-03-28 DIAGNOSIS — R9082 White matter disease, unspecified: Secondary | ICD-10-CM | POA: Diagnosis not present

## 2020-03-28 DIAGNOSIS — R5383 Other fatigue: Secondary | ICD-10-CM

## 2020-03-28 DIAGNOSIS — M4802 Spinal stenosis, cervical region: Secondary | ICD-10-CM | POA: Diagnosis not present

## 2020-03-28 DIAGNOSIS — R269 Unspecified abnormalities of gait and mobility: Secondary | ICD-10-CM | POA: Insufficient documentation

## 2020-03-28 DIAGNOSIS — G9389 Other specified disorders of brain: Secondary | ICD-10-CM | POA: Diagnosis not present

## 2020-03-28 DIAGNOSIS — M47812 Spondylosis without myelopathy or radiculopathy, cervical region: Secondary | ICD-10-CM | POA: Diagnosis not present

## 2020-03-28 DIAGNOSIS — M5023 Other cervical disc displacement, cervicothoracic region: Secondary | ICD-10-CM | POA: Diagnosis not present

## 2020-03-28 DIAGNOSIS — G319 Degenerative disease of nervous system, unspecified: Secondary | ICD-10-CM | POA: Diagnosis not present

## 2020-03-28 IMAGING — MR MR HEAD WO/W CM
14 of 16 series · 30 of 48 positions shown · IV contrast (9 ml Gadavist)
Comparison: MRI head [DATE]

CLINICAL DATA: Relapsing and remitting multiple sclerosis.

EXAM:
MRI HEAD WITHOUT AND WITH CONTRAST
TECHNIQUE: Multiplanar, multiecho pulse sequences of the brain and surrounding
structures were obtained without and with intravenous contrast.
CONTRAST:  9mL GADAVIST GADOBUTROL 1 MMOL/ML IV SOLN

[Series 5: DWI · axial · 3.0mm · 0.77mm/px · z∈[-75,+82]mm · 3 of 54 slices shown (1 of 4)]
[im 1/54]
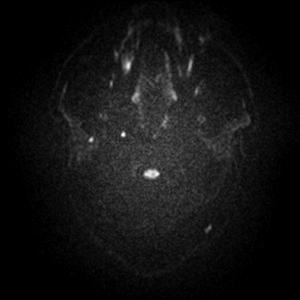
[im 27/54]
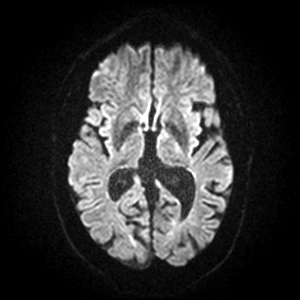
[im 54/54]
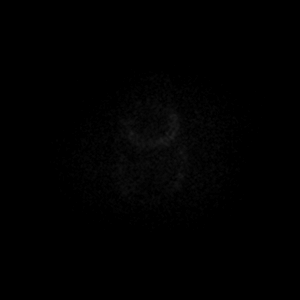

[Series 6: DWI · axial · 3.0mm · 0.77mm/px · z∈[-75,+82]mm · 3 of 54 slices shown (2 of 4)]
[im 1/54]
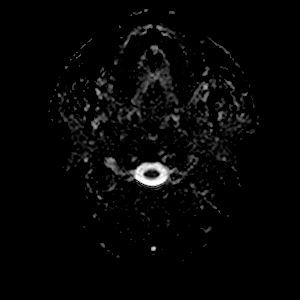
[im 27/54]
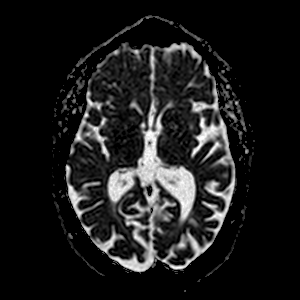
[im 54/54]
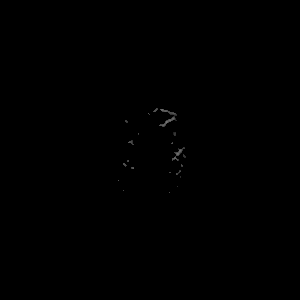

[Series 7: DWI · coronal · 5.0mm · 0.88mm/px · 2 of 32 slices shown (3 of 4)]
[im 1/32]
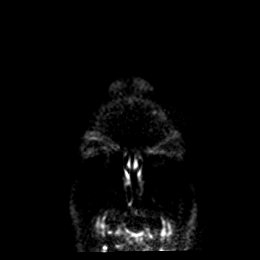
[im 32/32]
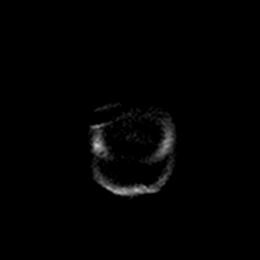

[Series 8: DWI · coronal · 5.0mm · 0.88mm/px · 2 of 32 slices shown (4 of 4)]
[im 1/32]
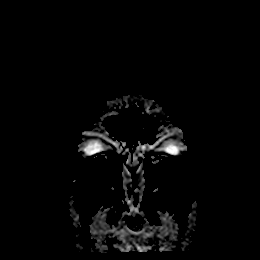
[im 32/32]
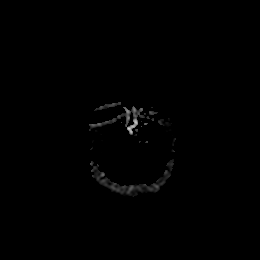

[Series 9: T1 · sagittal · 5.0mm · 0.78mm/px · 1 of 21 slices shown]
[im 1/21]
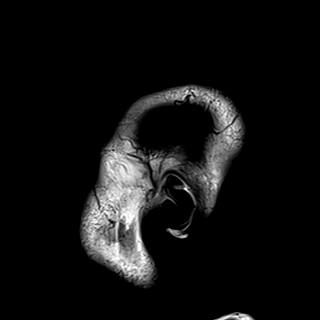

[Series 10: T2 · axial · 5.0mm · 0.72mm/px · 1 of 24 slices shown (1 of 2)]
[im 1/24]
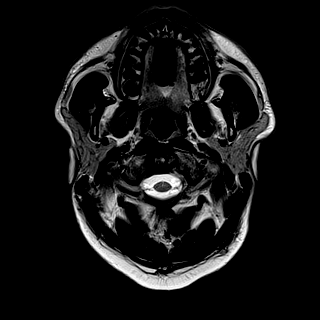

[Series 11: FLAIR · axial · 3.0mm · 0.45mm/px · z∈[-71,+81]mm · 3 of 52 slices shown (1 of 2)]
[im 1/52]
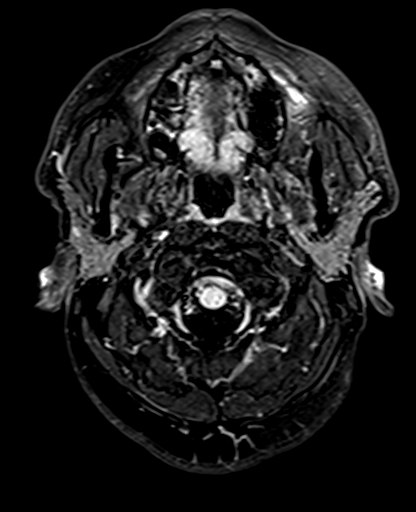
[im 26/52]
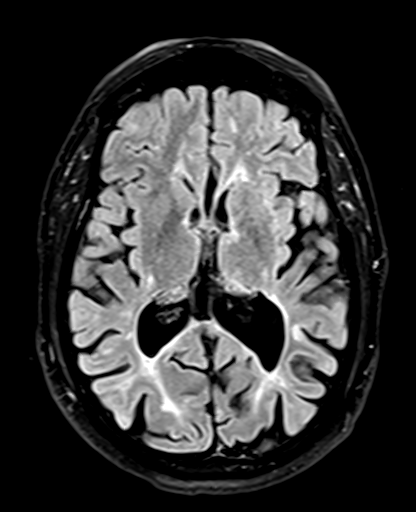
[im 52/52]
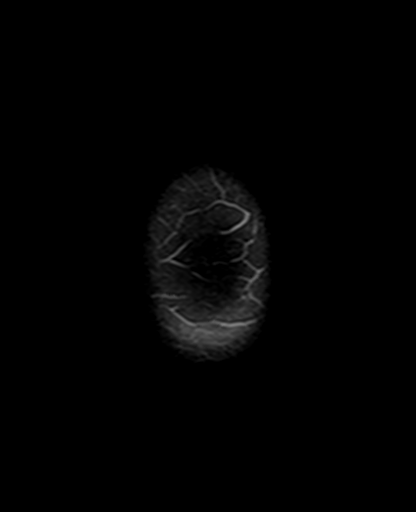

[Series 12: mag_images · axial · 3.0mm · 0.90mm/px · z∈[-78,+85]mm · 3 of 56 slices shown]
[im 1/56]
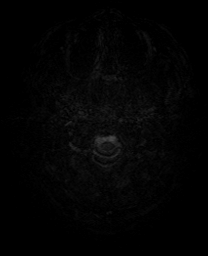
[im 28/56]
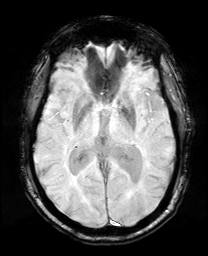
[im 56/56]
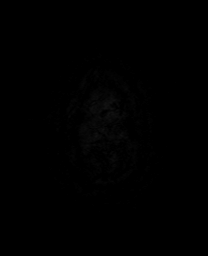

[Series 13: pha_images · axial · 3.0mm · 0.90mm/px · z∈[-78,+85]mm · 3 of 56 slices shown]
[im 1/56]
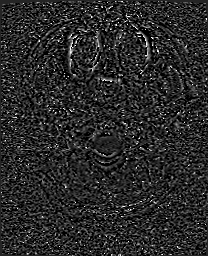
[im 28/56]
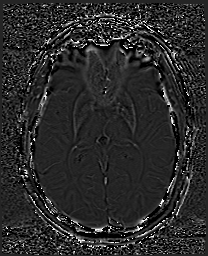
[im 56/56]
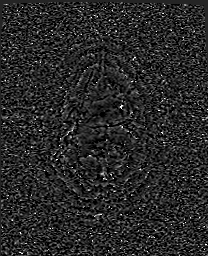

[Series 14: swi_images · axial · 3.0mm · 0.90mm/px · z∈[-78,+85]mm · 3 of 56 slices shown]
[im 1/56]
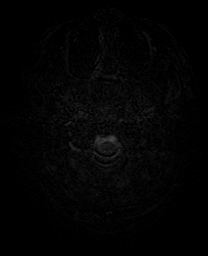
[im 28/56]
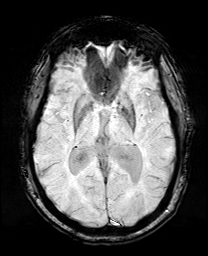
[im 56/56]
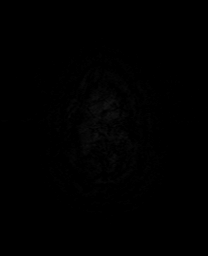

[Series 16: FLAIR · sagittal · 5.0mm · 0.98mm/px · 1 of 23 slices shown (2 of 2)]
[im 1/23]
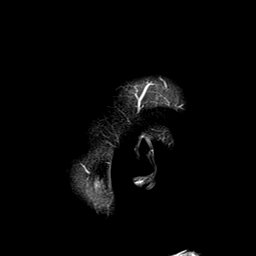

[Series 17: T2 · coronal · 5.0mm · 0.72mm/px · 2 of 32 slices shown (2 of 2)]
[im 1/32]
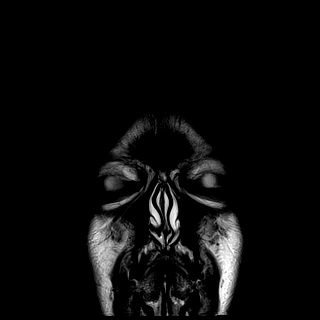
[im 32/32]
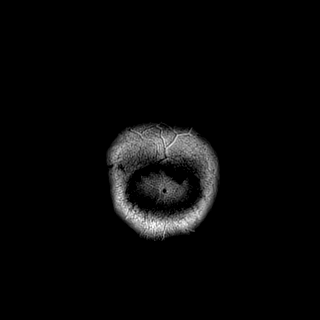

[Series 20: T1 post-contrast · coronal · 5.0mm · 0.34mm/px · 2 of 30 slices shown (1 of 2)]
[im 1/30]
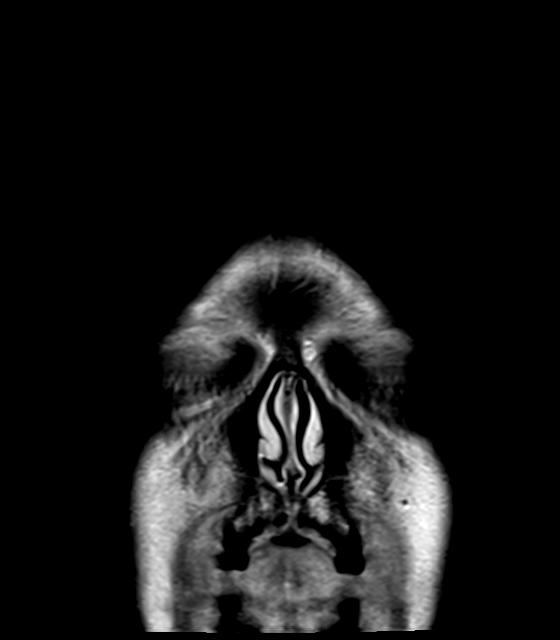
[im 30/30]
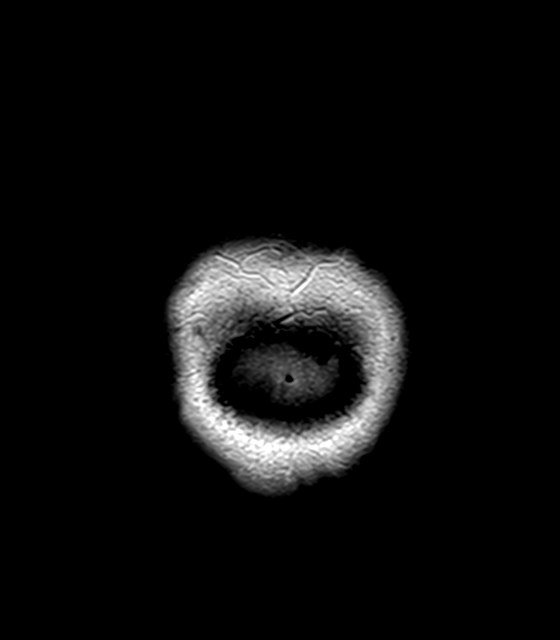

[Series 21: T1 post-contrast · sagittal · 5.0mm · 0.78mm/px · 1 of 20 slices shown (2 of 2)]
[im 1/20]
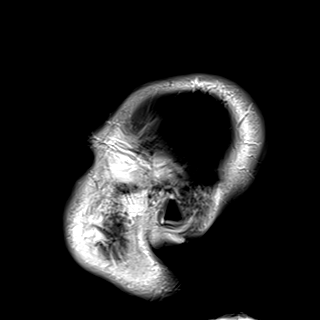

[30 of 48 positions shown; findings below may reference images not displayed]

FINDINGS: Brain: Extensive white matter change compatible with multiple
sclerosis is stable. Numerous periventricular and deep white matter
hyperintensities in both cerebral hemispheres stable. Multiple
lesions in the pons bilaterally unchanged. Small lesion in the left
cerebellum unchanged. No lesions show abnormal enhancement or
restricted diffusion.

There is cortical atrophy most severe in the parietal lobe
bilaterally unchanged. Negative for hydrocephalus.

Negative for acute infarct, hemorrhage, mass.

Vascular: Normal arterial flow voids

Skull and upper cervical spine: Negative

Sinuses/Orbits: Negative

Other: None
IMPRESSION: Extensive white matter changes compatible with multiple sclerosis.
No change from the prior study. Prominent parietal atrophy
unchanged.

## 2020-03-28 IMAGING — MR MR CERVICAL SPINE WO/W CM
6 of 8 series · 31 of 48 positions shown · IV contrast (9 ml Gadavist)
Comparison: MRI cervical spine [DATE]

CLINICAL DATA: Relaxing and remitting multiple sclerosis.

EXAM:
MRI CERVICAL SPINE WITHOUT AND WITH CONTRAST
TECHNIQUE: Multiplanar and multiecho pulse sequences of the cervical spine, to
include the craniocervical junction and cervicothoracic junction,
were obtained without and with intravenous contrast.
CONTRAST:  9mL GADAVIST GADOBUTROL 1 MMOL/ML IV SOLN

[Series 11: T2 · sagittal · 3.0mm · 0.69mm/px · 4 of 15 slices shown (1 of 2)]
[im 1/15]
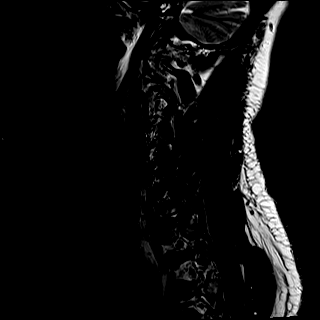
[im 5/15]
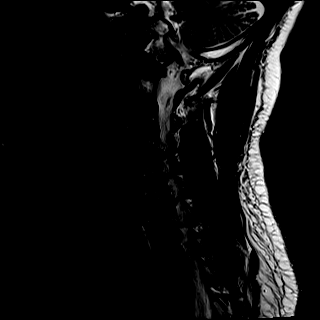
[im 10/15]
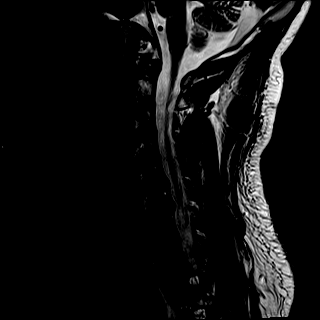
[im 15/15]
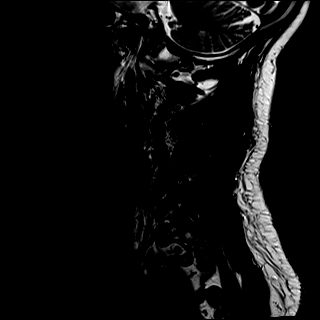

[Series 12: T1 · sagittal · 3.0mm · 0.86mm/px · 4 of 15 slices shown (1 of 2)]
[im 1/15]
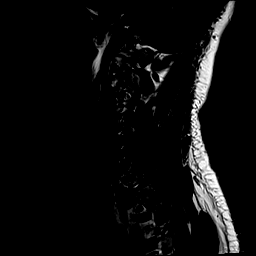
[im 5/15]
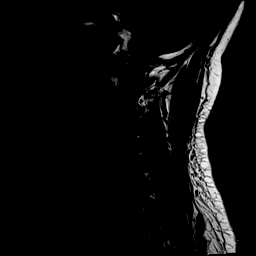
[im 10/15]
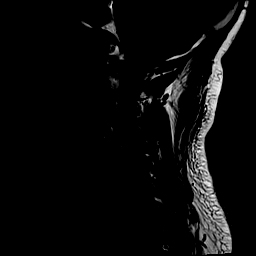
[im 15/15]
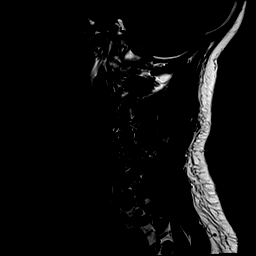

[Series 13: STIR · sagittal · 3.0mm · 0.69mm/px · 3 of 15 slices shown]
[im 1/15]
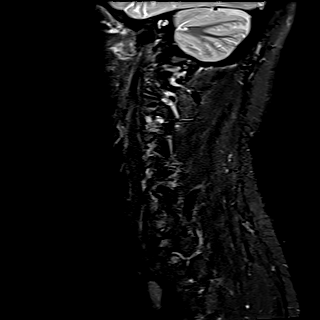
[im 5/15]
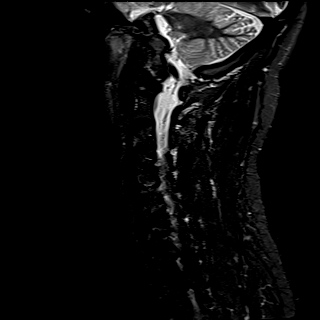
[im 10/15]
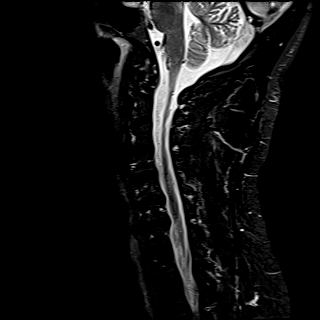

[Series 14: T2 · axial · 3.0mm · 0.70mm/px · z∈[-223,-111]mm · 8 of 36 slices shown (2 of 2)]
[im 1/36]
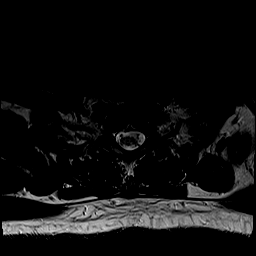
[im 6/36]
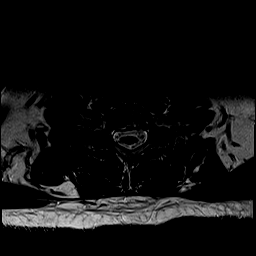
[im 11/36]
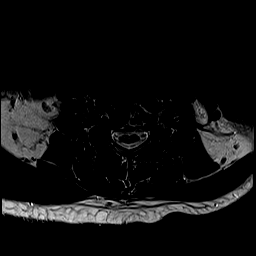
[im 16/36]
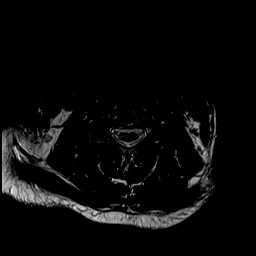
[im 21/36]
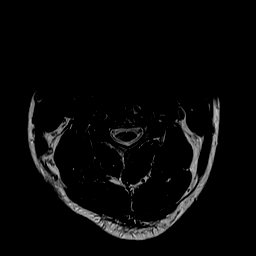
[im 26/36]
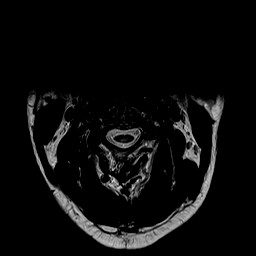
[im 31/36]
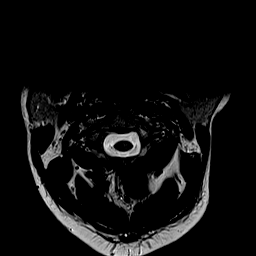
[im 36/36]
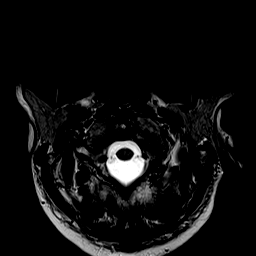

[Series 17: T1 fat-sat post-contrast · sagittal · 3.0mm · 0.43mm/px · 4 of 15 slices shown]
[im 1/15]
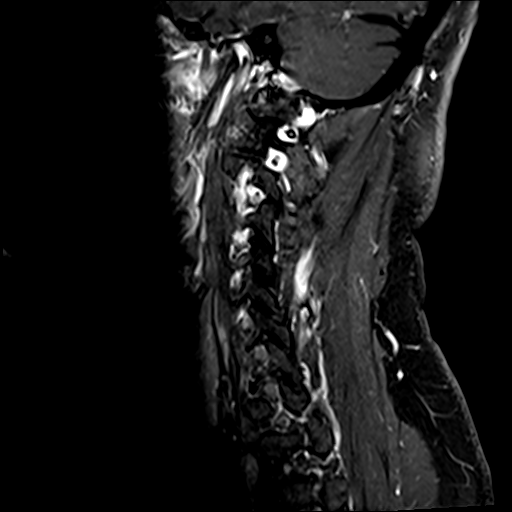
[im 5/15]
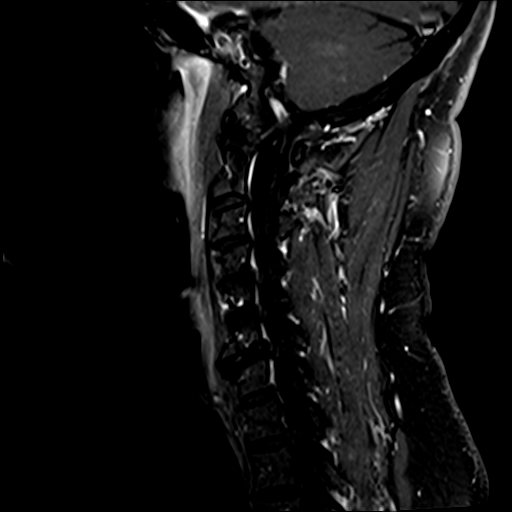
[im 10/15]
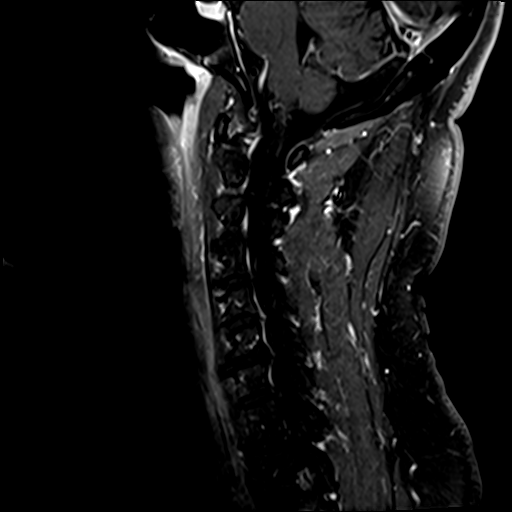
[im 15/15]
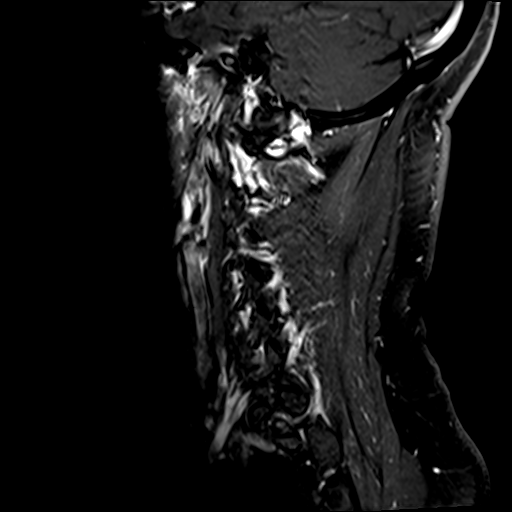

[Series 19: T1 · axial · 3.0mm · 0.35mm/px · z∈[-223,-111]mm · 8 of 36 slices shown (2 of 2)]
[im 1/36]
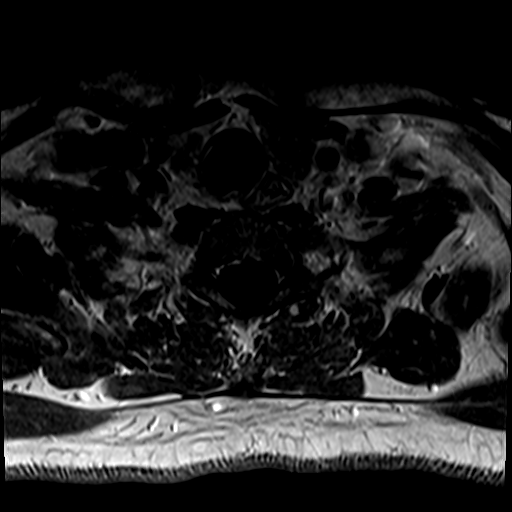
[im 6/36]
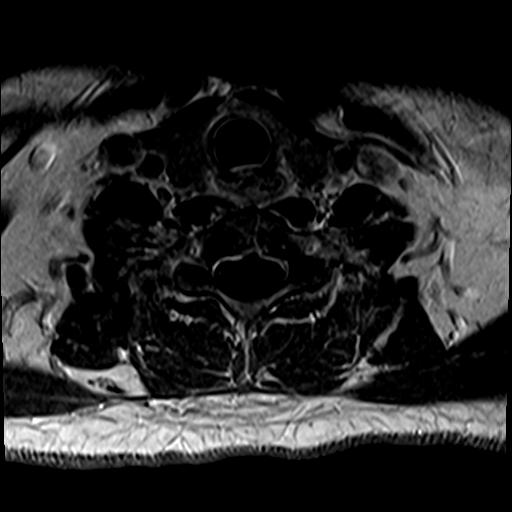
[im 11/36]
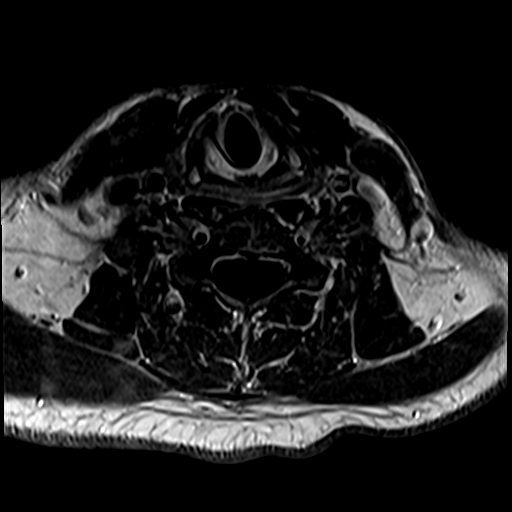
[im 16/36]
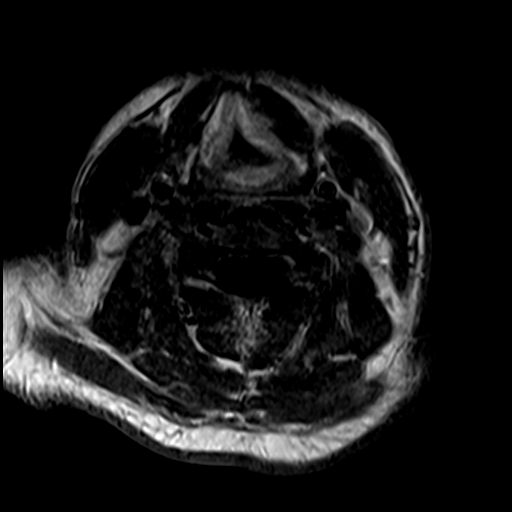
[im 21/36]
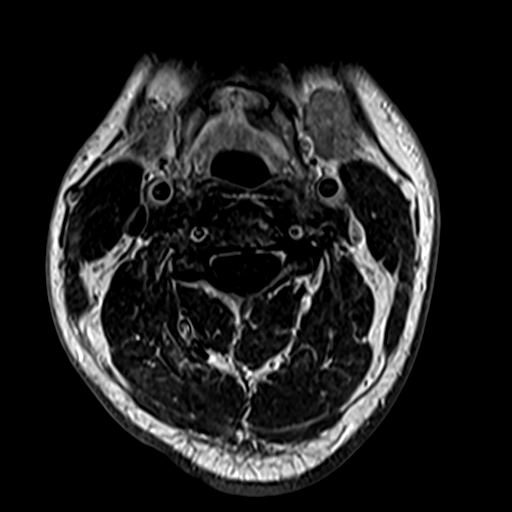
[im 26/36]
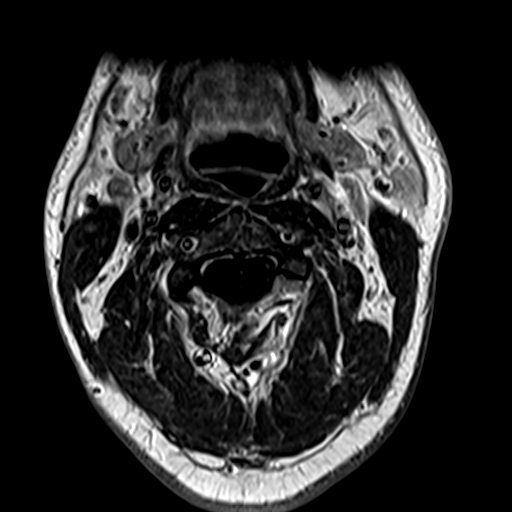
[im 31/36]
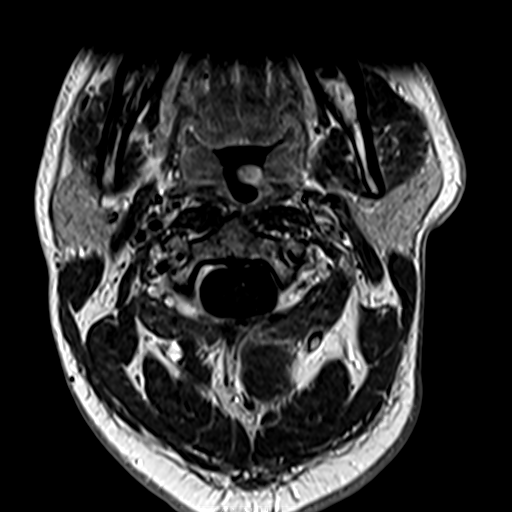
[im 36/36]
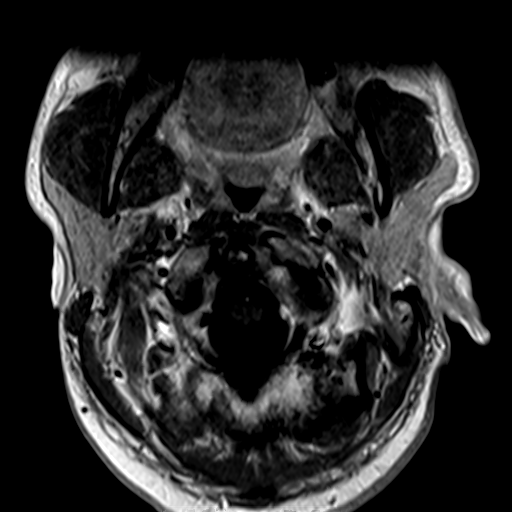

[31 of 48 positions shown; findings below may reference images not displayed]

FINDINGS: Alignment: Normal

Vertebrae: Normal bone marrow.  Negative for fracture or mass.

Cord: Extensive changes of the spinal cord consistent with
demyelinating disease. Overall progression from the prior study. No
enhancing lesions.

Confluent hyperintensity throughout the cervical spinal cord
centrally has progressed. More focal lesions at C2 also with
progression. Right-sided lesion at C3 unchanged. Right-sided lesions
at C4 and C5 with mild progression. Lesion at T1 unchanged.

Posterior Fossa, vertebral arteries, paraspinal tissues: Posterior
fossa lesions in the brainstem and cerebellum compatible with
demyelinating disease as described on separate MRI head report.

No paraspinous mass.

Disc levels:

C2-3: Mild disc degeneration.  Negative for stenosis

C3-4: Mild disc degeneration and spurring.  Negative for stenosis

C4-5: Moderate right foraminal encroachment due to spurring.

C5-6: Disc degeneration and mild spurring. Mild left foraminal
narrowing

C6-7: Disc degeneration with diffuse uncinate spurring. Mild spinal
stenosis and mild foraminal stenosis bilaterally

C7-T1: Mild disc degeneration and disc bulging. Mild left foraminal
narrowing due to facet hypertrophy.
IMPRESSION: Progression of extensive disease in the cervical spinal cord
compatible with demyelinating disease, compared with the MRI of
[4L]. No enhancing lesions.

Cervical spondylosis.

## 2020-03-28 MED ORDER — GADOBUTROL 1 MMOL/ML IV SOLN
9.0000 mL | Freq: Once | INTRAVENOUS | Status: AC | PRN
  Administered 2020-03-28: 9 mL via INTRAVENOUS

## 2020-04-02 ENCOUNTER — Telehealth: Payer: Self-pay | Admitting: Neurology

## 2020-04-02 NOTE — Telephone Encounter (Signed)
I spoke to the patient and provided him with these results. He also wanted them left on his wife's voicemail. I called [name] and left a message for her. I provided our number to call back with any questions.  He was started on [place] in 2017.

## 2020-04-02 NOTE — Telephone Encounter (Signed)
  IMPRESSION: Extensive white matter changes compatible with multiple sclerosis. No change from the prior study. Prominent parietal atrophy Unchanged.   IMPRESSION: Progression of extensive disease in the cervical spinal cord compatible with demyelinating disease, compared with the MRI of 2016. No enhancing lesions.  Cervical spondylosis.  Please call patient, MRI of cervical, and brain showed extensive MS lesions, there was some progression of lesions on cervical cord compared to previous study in 2016,

## 2020-04-11 ENCOUNTER — Telehealth: Payer: Self-pay | Admitting: *Deleted

## 2020-04-11 NOTE — Telephone Encounter (Signed)
[name] labs collected on12/21/2021:  CBC w/ Diff: wnl  UA: wnl  ALT+AST+TBili: wnl  TSH:0.576 wnl  Creatinine:1.07-wnl eGFR, If Africn Am: 93 - wnl  Results provided to Dr. Krista Blue for review. They were also sent to scanning.

## 2020-05-16 ENCOUNTER — Telehealth: Payer: Self-pay | Admitting: *Deleted

## 2020-05-16 NOTE — Telephone Encounter (Signed)
[name] labs collected on01/25/2022:  CBC w/ Diff:wnl  UA: wnl  ALT+AST+TBili: wnl  TSH:0.630wnl  Creatinine:0.95-wnl eGFR, If Africn Am: 107 - wnl  Results provided to Dr. Krista Blue for review. They were also sent to scanning.

## 2020-06-18 ENCOUNTER — Telehealth: Payer: Self-pay | Admitting: *Deleted

## 2020-06-18 DIAGNOSIS — M25561 Pain in right knee: Secondary | ICD-10-CM | POA: Diagnosis not present

## 2020-06-18 DIAGNOSIS — G35 Multiple sclerosis: Secondary | ICD-10-CM | POA: Diagnosis not present

## 2020-06-18 NOTE — Telephone Encounter (Signed)
[name] labs collected on02/24/2022:  CBC w/ Diff:wnl  UA:wnl  ALT+AST+TBili: wnl  TSH:0.619wnl  Creatinine:0.99-wnl eGFR, If Africn Am: 102 - wnl  Results provided to Dr. Krista Blue for review. They were also sent to scanning.

## 2020-07-11 ENCOUNTER — Telehealth: Payer: Self-pay | Admitting: *Deleted

## 2020-07-11 NOTE — Telephone Encounter (Signed)
[name] labs collected on03/22/2022:  CBC w/ Diff:wnl  UA:wnl  ALT+AST+TBili: wnl  TSH:0.562wnl  Creatinine:1.00-wnl eGFR, If Africn Am:92- wnl  Results provided to Dr. Yan for review. They were also sent to scanning. 

## 2020-08-13 ENCOUNTER — Encounter: Payer: Self-pay | Admitting: Neurology

## 2020-08-14 ENCOUNTER — Telehealth: Payer: Self-pay | Admitting: *Deleted

## 2020-08-14 NOTE — Telephone Encounter (Addendum)
[name] labs collected 08/13/2020:   CBC w/ Diff:wnl  UA:wnl  ALT+AST+TBili: wnl  TSH:0.412 (L)  Creatinine: .99-wnl eGFR, If Africn Am:93- wnl  Results provided to Dr. Krista Blue for review. They were also sent to scanning.  ___________________________________________   His TSH is low this month. Last month it was wnl at 0.562.  Per VO by Dr. Krista Blue, TSH will be checked again next month. It will be closely monitored.

## 2020-09-10 ENCOUNTER — Encounter: Payer: Self-pay | Admitting: Neurology

## 2020-09-11 ENCOUNTER — Telehealth: Payer: Self-pay | Admitting: *Deleted

## 2020-09-11 NOTE — Telephone Encounter (Signed)
Received lab results from [facility] re: CBC w/diff, UA, TSH, creatinine, ALT/AST/TBili. Reports in MD in box for review.

## 2020-09-11 NOTE — Telephone Encounter (Signed)
Labs reviewed by Dr Krista Blue all labs are normal. Sent to be scanned.

## 2020-10-15 ENCOUNTER — Telehealth: Payer: Self-pay | Admitting: *Deleted

## 2020-10-15 NOTE — Telephone Encounter (Signed)
[name] labs collected 10/11/2020:     CBC w/ Diff: MCHC 30.9 (L)   UA: WBC Exterase - Trace (Abnormal)   ALT+AST+TBili: wnl   TSH: 0.746 - wnl   Creatinine: 1.06 - wnl eGFR, If Africn Am: 85 - wnl   Results provided to Dr. Krista Blue for review.  They were also sent to scanning.

## 2020-10-17 NOTE — Telephone Encounter (Signed)
Per vo by Dr. Krista Blue, call patient to make sure he is not showing any signs of UTI.  I spoke to his wife, [name]. Informed her of the lab results. Denies he is having any signs or symptoms of UTI. She will monitor him closely.  She verbalized understanding to proceed to PCP if he develops any foul smelling urine, frequent urination, bladder spasm, cloudy urine, dark urine, persistent urge to urinate, sense of incomplete bladder emptying, or blood in urine.

## 2020-11-14 ENCOUNTER — Encounter: Payer: Self-pay | Admitting: Neurology

## 2020-11-19 ENCOUNTER — Telehealth: Payer: Self-pay | Admitting: *Deleted

## 2020-11-19 NOTE — Telephone Encounter (Signed)
[name] labs collected 11/14/2020:     CBC w/ Diff: MCHC 30.4 (L)   UA: wnl   ALT+AST+TBili: wnl   TSH: 0.859 - wnl   Creatinine: 0.98 - wnl eGFR, If Africn Am: 94 - wnl   Results provided to Dr. Krista Blue for review.  They were also sent to scanning.

## 2021-02-18 DIAGNOSIS — Z23 Encounter for immunization: Secondary | ICD-10-CM | POA: Diagnosis not present

## 2021-03-07 ENCOUNTER — Ambulatory Visit: Payer: Medicare Other | Admitting: Neurology

## 2021-03-21 DIAGNOSIS — H524 Presbyopia: Secondary | ICD-10-CM | POA: Diagnosis not present

## 2021-03-21 DIAGNOSIS — H47293 Other optic atrophy, bilateral: Secondary | ICD-10-CM | POA: Diagnosis not present

## 2021-03-22 ENCOUNTER — Other Ambulatory Visit: Payer: Self-pay | Admitting: Neurology

## 2021-03-24 ENCOUNTER — Other Ambulatory Visit: Payer: Self-pay | Admitting: Neurology

## 2021-03-24 DIAGNOSIS — G35 Multiple sclerosis: Secondary | ICD-10-CM

## 2021-03-24 DIAGNOSIS — R269 Unspecified abnormalities of gait and mobility: Secondary | ICD-10-CM

## 2021-03-26 ENCOUNTER — Telehealth: Payer: Self-pay | Admitting: Neurology

## 2021-03-26 NOTE — Telephone Encounter (Signed)
Pt's wife called requesting refills for DULoxetine (CYMBALTA) 60 MG capsule & QUEtiapine (SEROQUEL) 25 MG tablet. Pharmacy [facility] - [place], [place]

## 2021-03-26 NOTE — Telephone Encounter (Signed)
Rx already sent.

## 2021-04-09 ENCOUNTER — Other Ambulatory Visit: Payer: Self-pay | Admitting: Neurology

## 2021-04-09 NOTE — Telephone Encounter (Signed)
Rx refilled.

## 2021-05-20 ENCOUNTER — Other Ambulatory Visit: Payer: Self-pay

## 2021-05-20 ENCOUNTER — Telehealth: Payer: Self-pay | Admitting: Neurology

## 2021-05-20 ENCOUNTER — Encounter: Payer: Self-pay | Admitting: Neurology

## 2021-05-20 ENCOUNTER — Ambulatory Visit (INDEPENDENT_AMBULATORY_CARE_PROVIDER_SITE_OTHER): Payer: Medicare Other | Admitting: Neurology

## 2021-05-20 VITALS — BP 133/81 | HR 83 | Ht 71.0 in | Wt 208.5 lb

## 2021-05-20 DIAGNOSIS — R5383 Other fatigue: Secondary | ICD-10-CM

## 2021-05-20 DIAGNOSIS — R269 Unspecified abnormalities of gait and mobility: Secondary | ICD-10-CM | POA: Diagnosis not present

## 2021-05-20 DIAGNOSIS — G35 Multiple sclerosis: Secondary | ICD-10-CM

## 2021-05-20 NOTE — Progress Notes (Signed)
ASSESSMENT AND PLAN 51 y.o. year old male   Relapsing remitting Multiple Sclerosis -Lawrence Lamb, finished 2nd dose in September 2018 Most recent of MRI of the brain and cervical spine was in December 2021, extensive white matter changes, prominent parietal atrophy, with evidence of extensive disease scattered throughout spinal cord, C2, right side at C3, C4-5, mild progression, lesion at T1, Repeat MRI of the brain with without contrast  Worsening memory loss MoCA examination 16/30, Most consistent with his advanced relapsing remitting multiple sclerosis, evidence of brain atrophy, Laboratory evaluation to rule out treatable etiology   Gait Abnormality  Previously tried Lawrence Lamb, no significant improvement, no longer taking it, Lawrence Lamb 10 mg 3 times a day Refer to home physical therapy  Depression/PTSD insomnia Improved with Lawrence Lamb 25 mg every night, Continue Lawrence Lamb 60 mg daily  Urinary urgency, chronic constipation, Lawrence Lamb 5 mg every night has helped Lawrence Lamb as needed   DIAGNOSTIC DATA (LABS, IMAGING, TESTING) - I reviewed patient records, labs, notes, testing and imaging myself where available.   HISTORY OF PRESENT ILLNESS:Lawrence Lamb is a 51 years old right-handed [place] American male, follow-up for relapsing remediating multiple sclerosis   He moved from [place] to [place] in 2013, was previously under the care of [facility] specialist Lawrence Lamb,   He was diagnosed with relapsing remitting multiple sclerosis in early 2007, presenting with right face, right leg weakness, has mild gait difficulty, diagnosis was confirmed by marked abnormal MRI of brain, and cervical lesions    He was initially treated with Betaseron for 18 months, clinically he was stable, but there was enhancing MRI lesions on Betaseron, he developed positive interferon antibodies.  Lawrence Lamb was started since February 2009, initially every 4 weeks, later stretching to every 6 weeks,  JC virus antibody was negative in September 2009 and April 2010, but JC virus antibody began to be positive since September 2010, he tolerates the Lawrence Lamb very well from Feb 2009 till Jan 2013. He has repeat MRI of brain every 6 months.     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  He was treated with Lawrence Lamb from January 24th, 2014 for 6 months, but he continued to have slow progressive worsening gait difficulty, fell multiple times, had few rounds of steroid.  Abnormal visual evoked response test secondary to prolongation of the Lawrence Lamb waveform on the left.     He now complains of constant gait difficulty, bilateral lower extremity spasticity, also complains of urinary incontinence, double vision, he could no longer driving,  also complains of worsening memory trouble, difficulty focusing, could not take care of his young children by himself.      Lawrence Lamb was positive, index value is 4.15 in June 2014, 2.66 in 11/22/2013   I have discussed Lamb options with patient and his wife, he had progressive worsening gait difficulty, memory trouble, vision problem since he was off Lawrence Lamb, taking Lawrence Lamb,  His symptoms were relatively stable while taking Lawrence Lamb is 10/998 chance of Lawrence Lamb,  He restarted on Lawrence Lamb Lamb  since Lawrence 14th 2014  UPDATE Feb 11th 2016:YY His walking has much improved with Lawrence Lamb twice a day, no significant side effect, he did not had MRI of brain, cervical spine as previously scheduled,     UPDATE October 10 2014:YY He was admitted to hospital March 28-30 2016 for flulike  illness, he had much worsening gait difficulty ever since, now rely on his forefoot cane, he also complains of worsening nocturia, wake up multiple times during night, depressed he could not contribute much to his family, he  is still waiting for social disability applications.    Update August the second 2016:YY He continue complains of gait difficulty, right worse than left, rely on his cane, Lawrence Lamb has been helpful for his depression, complains of worsening memory trouble Anti-Lawrence Lamb antibody was negative. He and his wife concerned about the side effect of Lawrence Lamb, wants to stay on Lawrence Lamb.   I have reviewed and compared MRI scan in June 2016 at [facility] to February 2016 MRIs, multiple supratentorium lesions, T1 black holes, no contrast enhancement, no significant change compared to previous scans,   UPDATE Feb 21 2015:YY He started to feel increaesd weakness since Nov 1st, right leg worse than left, he took a shower, his leg gave out, fell down, difficult to get out of bathtub, he also has chronic constipations    UPDATE Jan 18 Lamb:YY He was treated with one course of Medrol pack, his walking has improved some, but overall has slow progress in gait difficulty, worsening memory trouble, he can no longer babysit his young children at home, mild depression, Lawrence Lamb has made his depression worse, worsening constipations, failed to improve by over-the-counter medications   UPDATE April 24 Lamb:Lawrence Lamb Lawrence Lamb Lamb was in March 20 Lamb, he was evaluated by Lawrence Lamb in March 20 eighth Lamb, he is at high risk to develop complication with continued Lawrence Lamb Lamb, in addition, he continue have progressive worsening gait difficulty on Lawrence Lamb, frequent flareups, we decided to proceed with Lawrence Lamb Lawrence Lamb:YY He had first dose of Lawrence Lamb from July 10-14th, Lamb tolerate it well.  I have reviewed hospital admission on July 16 Lamb to Lawrence Lamb,   Patient woke up July 15 Lamb was found to be confused, had a fever of 102, he just finished [facility] on July 14th Lamb, upon presentation, blood pressure was 119/54, heart rate of 79, temperature 99.2, patient was  documented as low alert, no skin rash, blood culture was sent, he was treated with Lawrence Lamb, next   CT of the chest without contrast showed minimum bilateral dependent subsegmental atelectasis, no acute abnormality, MRI of the brain with and without contrast on July 17 Lamb showed advanced chronic demyelinating disease throughout the brain, unchanged from January Lamb, no new lesion  X-rays was negative for pneumonia,   EKG showed sinus rhythm with heart rate of 97 Laboratory evaluation, glucose 94, creatinine 0.91, and Was mildly elevated 13 point 9, CPK was 282, WBC was 7.8, hemoglobin was 13 point 5, neutrophil was 6 5.6%, influenza a antibody was positive, UA showed negative leukoesterase, negative nitrate,   He was treated with antibiotics, IV solution, his symptoms quickly improved over 4 days, he is now at his baseline, ambulate with a walker, he does not drive, his sister stays with him during the day, he watches TV,  He complains of worsening visual problem, he was seen by optometrist, he could see close vision, but could not read far away.    UPDATE October 02 2016: He denies any significant change, he ambulate with a cane, he continue have significant memory loss, denies significant depression, mild fatigue, he attend adult day program.  He continues to have mild to moderate constipation, bladder urgency.   He will have repeat [facility] Lamb in July 2018, 2  days following previous Lamb on November 03 2016, he was admitted to the hospital for fever, symptoms quickly improved with antibiotic Lamb, but UA showed negative leukoesterase, negative nitrate, CT chest showed minimum bilateral dependent actelesis, no acute abnormality  He has monthly lab by [facility] at the middle of month.   UPDATE Sept 6th 2018: He tolerates second round of [facility] well in 2018, finished Lamb last week, no fever noted. He has baseline gait abnormality, no significant worsening, also complains of mild  depression anxiety insomnia taking melatonin, Ambien as needed   UPDATE September 30 2017: MRI of the brain 04/03/2017 unchanged with severe burden of demyelinating disease without active demyelinating lesions when compared to MRI in Lamb.    He is overall fairly stable, laboratory evaluations was within normal range, CD4 count is trending up,  has stopped the Valtrex Lamb, he fell showering April 2019 in with 2 broken ribs on the left side, now recovered, there is no significant change in his gait abnormality  Update June 27, 2019: He is accompanied by his wife at today's clinical visit, continue complains of significant gait abnormality, fell every few weeks, ambulate with a forefoot cane, continue to feel with chronic fatigue, depression, insomnia, is also under the care of [facility] for [facility], recently stopped the Ambien, started on Lawrence Lamb in February 2021, he can sleep better.  He spent most of the day sitting, watching TV, reading books, also has memory loss, rely on his wife to fix his medication box.  UPDATE Mar 07 2020: He was treated with UTI at end of September 2021, he is overall stable, continue have gait abnormality, mental slowing, fatigue, urinary urgency, on polypharmacy Lamb, also carries a diagnosis depression, PTSD, Lawrence Lamb 25 mg every night has been helpful  UPDATE May 20 2021: He is accompanied by his wife at today's visit, she works from home, patient was noted to have gradual worsening gait abnormality, memory loss, could not carry on much of household obligations, sitting most of the time, do minor exercise regularly,  He continues to have nocturia, improved by oxybutynin,    PHYSICAL EXAM Vitals:   05/20/21 0956  BP: 133/81  Pulse: 83  Weight: 208 lb 8 oz (94.6 kg)  Height: 5\' 11"  (1.803 m)    Body mass index is 29.08 kg/m.  Gen: NAD, conversant, well nourised, well groomed                     Cardiovascular: Regular rate rhythm, no peripheral edema,  warm, nontender. Eyes: Conjunctivae clear without exudates or hemorrhage Neck: Supple, no carotid bruits. Pulmonary: Clear to auscultation bilaterally   NEUROLOGICAL EXAM:  MENTAL STATUS: Speech/cognition:    Speech is slow, slow reaction time, cooperative on examination  Montreal Cognitive Assessment  05/20/2021  Visuospatial/ Executive (0/5) 3  Naming (0/3) 3  Attention: Read list of digits (0/2) 2  Attention: Read list of letters (0/1) 1  Attention: Serial 7 subtraction starting at 100 (0/3) 0  Language: Repeat phrase (0/2) 2  Language : Fluency (0/1) 0  Abstraction (0/2) 2  Delayed Recall (0/5) 0  Orientation (0/6) 3  Total 16      CRANIAL NERVES: CN II: Visual fields are full to confrontation.  Pupils are round equal and briskly reactive to light. CN III, IV, VI: extraocular movement are normal. No ptosis. CN V: Facial sensation is intact CN VII: Face is symmetric with normal eye closure and smile. CN VIII: Hearing is normal to  casual conversation CN IX, X: Phonation is normal. CN XI: Head turning and shoulder shrug are intact   MOTOR: Moderate bilateral lower extremity spasticity, right worse than left, mild right upper extremity weakness, fixation of right upper extremity upon rapid rotating movement,  mild bilateral hip flexion weakness, right worse than left.  REFLEXES: Reflexes are hyperreflexia, and symmetric at the biceps, triceps, knees, and ankles.  SENSORY: Length dependent decreased light touch, vibratory sensation to knee level,  COORDINATION: Rapid alternating movements and fine finger movements are intact. There is no dysmetria on finger-to-nose and heel-knee-shin.    GAIT/STANCE: He rely on his forefoot cane to get up from seated position, stiff, dragging his right leg.  REVIEW OF SYSTEMS: Out of a complete 14 system review of symptoms, the patient complains only of the following symptoms, and all other reviewed systems are negative.  Gait  abnormal, weakness, insomnia, confusion  ALLERGIES: No Known Allergies  HOME MEDICATIONS: Outpatient Medications Prior to Visit  Medication Sig Dispense Refill   Alemtuzumab 12 MG/1.2ML SOLN Inject into the vein.     Lawrence Lamb (LIORESAL) 10 MG tablet Take 1 tablet (10 mg total) by mouth 3 (three) times daily. 270 tablet 3   Cholecalciferol (VITAMIN D PO) Take by mouth 2 (two) times daily. Takes 2000units daily     dalfampridine 10 MG TB12 TAKE 1 TABLET BY MOUTH TWICE A DAY 60 tablet 11   DULoxetine (Lawrence Lamb) 60 MG capsule TAKE 1 CAPSULE EVERY DAY 90 capsule 4   Lawrence Lamb (CHRONULAC) 10 GM/15ML solution TAKE 15 MLS (10 GM TOTAL) BY MOUTH 3 TIMES FOR CONSTIPATION. 4050 mL 3   loratadine (CLARITIN) 10 MG tablet Take 10 mg by mouth daily as needed.     multivitamin-iron-minerals-folic acid (CENTRUM) chewable tablet Chew 1 tablet by mouth daily.     omega-3 acid ethyl esters (LOVAZA) 1 G capsule Take 1,000 mg by mouth 2 (two) times daily.     oxybutynin (Lawrence Lamb) 5 MG tablet Take 1 tablet (5 mg total) by mouth every 8 (eight) hours as needed for bladder spasms. TAKE ONE TABLET BY MOUTH EVERY 8 HOURS AS NEEDED FOR BLADDER SPASMS 60 tablet 11   QUEtiapine (Lawrence Lamb) 25 MG tablet TAKE 1 TABLET AT BEDTIME 90 tablet 4   No facility-administered medications prior to visit.    PAST MEDICAL HISTORY: Past Medical History:  Diagnosis Date   Multiple sclerosis ([place]) 2007    PAST SURGICAL HISTORY: Past Surgical History:  Procedure Laterality Date   APPENDECTOMY  52 years old   VASECTOMY  08/2013    FAMILY HISTORY: Family History  Problem Relation Age of Onset   High blood pressure Mother    High blood pressure Father     SOCIAL HISTORY: Social History   Socioeconomic History   Marital status: Married    Spouse name: [name]   Number of children: 2   Years of education: 12   Highest education level: Not on file  Occupational History    Comment: Disabled  Tobacco Use   Smoking  status: Never   Smokeless tobacco: Never  Substance and Sexual Activity   Alcohol use: No   Drug use: No   Sexual activity: Not on file  Other Topics Concern   Not on file  Social History Narrative   Patient lives at home with his wife [name]). Patient has two children. Patient is disabled.   Right handed.   Caffeine- one cup daily.   Social Determinants of Radio broadcast assistant  Strain: Not on file  Food Insecurity: Not on file  Transportation Needs: Not on file  Physical Activity: Not on file  Stress: Not on file  Social Connections: Not on file  Intimate Partner Violence: Not on file    Total time spent reviewing the chart, obtaining history, examined patient, ordering tests, documentation, consultations and family, care coordination was 18 minutes     Marcial Pacas, M.D. Ph.D.  [facility] [place], [place] [address] Phone: [phone] Fax:      [phone]

## 2021-05-20 NOTE — Telephone Encounter (Signed)
[place] with [facility] a message to see if they will be able to take this patient.

## 2021-05-20 NOTE — Telephone Encounter (Signed)
Medicare no auth req order sent to [facility] to be scheduled at [facility]. They will reach out to the patient to schedule.

## 2021-05-20 NOTE — Telephone Encounter (Signed)
[facility] will be able to take the patient.

## 2021-05-21 LAB — CBC WITH DIFFERENTIAL
Basophils Absolute: 0.1 10*3/uL (ref 0.0–0.2)
Basos: 1 %
EOS (ABSOLUTE): 0.1 10*3/uL (ref 0.0–0.4)
Eos: 2 %
Hematocrit: 41.9 % (ref 37.5–51.0)
Hemoglobin: 13.8 g/dL (ref 13.0–17.7)
Immature Grans (Abs): 0 10*3/uL (ref 0.0–0.1)
Immature Granulocytes: 0 %
Lymphocytes Absolute: 1.5 10*3/uL (ref 0.7–3.1)
Lymphs: 26 %
MCH: 30.2 pg (ref 26.6–33.0)
MCHC: 32.9 g/dL (ref 31.5–35.7)
MCV: 92 fL (ref 79–97)
Monocytes Absolute: 0.4 10*3/uL (ref 0.1–0.9)
Monocytes: 7 %
Neutrophils Absolute: 3.7 10*3/uL (ref 1.4–7.0)
Neutrophils: 64 %
RBC: 4.57 x10E6/uL (ref 4.14–5.80)
RDW: 12 % (ref 11.6–15.4)
WBC: 5.8 10*3/uL (ref 3.4–10.8)

## 2021-05-21 LAB — VITAMIN B12: Vitamin B-12: 513 pg/mL (ref 232–1245)

## 2021-05-21 LAB — TSH: TSH: 1.13 u[IU]/mL (ref 0.450–4.500)

## 2021-05-21 LAB — COMPREHENSIVE METABOLIC PANEL
ALT: 29 IU/L (ref 0–44)
AST: 22 IU/L (ref 0–40)
Albumin/Globulin Ratio: 2.1 (ref 1.2–2.2)
Albumin: 4.8 g/dL (ref 3.8–4.9)
Alkaline Phosphatase: 51 IU/L (ref 44–121)
BUN/Creatinine Ratio: 10 (ref 9–20)
BUN: 10 mg/dL (ref 6–24)
Bilirubin Total: 0.3 mg/dL (ref 0.0–1.2)
CO2: 24 mmol/L (ref 20–29)
Calcium: 9.6 mg/dL (ref 8.7–10.2)
Chloride: 102 mmol/L (ref 96–106)
Creatinine, Ser: 0.96 mg/dL (ref 0.76–1.27)
Globulin, Total: 2.3 g/dL (ref 1.5–4.5)
Glucose: 86 mg/dL (ref 70–99)
Potassium: 4.6 mmol/L (ref 3.5–5.2)
Sodium: 142 mmol/L (ref 134–144)
Total Protein: 7.1 g/dL (ref 6.0–8.5)
eGFR: 96 mL/min/{1.73_m2} (ref >59)

## 2021-05-21 LAB — RPR: RPR Ser Ql: NONREACTIVE

## 2021-05-21 LAB — C-REACTIVE PROTEIN: CRP: 3 mg/L (ref 0–10)

## 2021-05-21 LAB — SEDIMENTATION RATE: Sed Rate: 5 mm/hr (ref 0–30)

## 2021-05-21 LAB — FOLATE: Folate: >20 ng/mL (ref >3.0)

## 2021-05-22 ENCOUNTER — Telehealth: Payer: Self-pay | Admitting: Neurology

## 2021-05-22 DIAGNOSIS — R5383 Other fatigue: Secondary | ICD-10-CM | POA: Diagnosis not present

## 2021-05-22 DIAGNOSIS — R269 Unspecified abnormalities of gait and mobility: Secondary | ICD-10-CM | POA: Diagnosis not present

## 2021-05-22 DIAGNOSIS — K5909 Other constipation: Secondary | ICD-10-CM | POA: Diagnosis not present

## 2021-05-22 DIAGNOSIS — R413 Other amnesia: Secondary | ICD-10-CM | POA: Diagnosis not present

## 2021-05-22 DIAGNOSIS — F32A Depression, unspecified: Secondary | ICD-10-CM | POA: Diagnosis not present

## 2021-05-22 DIAGNOSIS — F4310 Post-traumatic stress disorder, unspecified: Secondary | ICD-10-CM | POA: Diagnosis not present

## 2021-05-22 DIAGNOSIS — G4700 Insomnia, unspecified: Secondary | ICD-10-CM | POA: Diagnosis not present

## 2021-05-22 DIAGNOSIS — H532 Diplopia: Secondary | ICD-10-CM | POA: Diagnosis not present

## 2021-05-22 DIAGNOSIS — Z9181 History of falling: Secondary | ICD-10-CM | POA: Diagnosis not present

## 2021-05-22 DIAGNOSIS — R32 Unspecified urinary incontinence: Secondary | ICD-10-CM | POA: Diagnosis not present

## 2021-05-22 DIAGNOSIS — R252 Cramp and spasm: Secondary | ICD-10-CM | POA: Diagnosis not present

## 2021-05-22 DIAGNOSIS — Z87440 Personal history of urinary (tract) infections: Secondary | ICD-10-CM | POA: Diagnosis not present

## 2021-05-22 DIAGNOSIS — R3915 Urgency of urination: Secondary | ICD-10-CM | POA: Diagnosis not present

## 2021-05-22 DIAGNOSIS — G35 Multiple sclerosis: Secondary | ICD-10-CM | POA: Diagnosis not present

## 2021-05-22 NOTE — Telephone Encounter (Signed)
Left message - okay to proceed with needed orders for the patient.

## 2021-05-22 NOTE — Telephone Encounter (Signed)
Lawrence Lamb from [facility] called needing VO for 2x 3 weeks and 1x 4 weeks.

## 2021-05-24 DIAGNOSIS — F32A Depression, unspecified: Secondary | ICD-10-CM | POA: Diagnosis not present

## 2021-05-24 DIAGNOSIS — G35 Multiple sclerosis: Secondary | ICD-10-CM | POA: Diagnosis not present

## 2021-05-24 DIAGNOSIS — R413 Other amnesia: Secondary | ICD-10-CM | POA: Diagnosis not present

## 2021-05-24 DIAGNOSIS — F4310 Post-traumatic stress disorder, unspecified: Secondary | ICD-10-CM | POA: Diagnosis not present

## 2021-05-24 DIAGNOSIS — R252 Cramp and spasm: Secondary | ICD-10-CM | POA: Diagnosis not present

## 2021-05-24 DIAGNOSIS — R269 Unspecified abnormalities of gait and mobility: Secondary | ICD-10-CM | POA: Diagnosis not present

## 2021-05-27 DIAGNOSIS — R413 Other amnesia: Secondary | ICD-10-CM | POA: Diagnosis not present

## 2021-05-27 DIAGNOSIS — R269 Unspecified abnormalities of gait and mobility: Secondary | ICD-10-CM | POA: Diagnosis not present

## 2021-05-27 DIAGNOSIS — F32A Depression, unspecified: Secondary | ICD-10-CM | POA: Diagnosis not present

## 2021-05-27 DIAGNOSIS — R252 Cramp and spasm: Secondary | ICD-10-CM | POA: Diagnosis not present

## 2021-05-27 DIAGNOSIS — F4310 Post-traumatic stress disorder, unspecified: Secondary | ICD-10-CM | POA: Diagnosis not present

## 2021-05-27 DIAGNOSIS — G35 Multiple sclerosis: Secondary | ICD-10-CM | POA: Diagnosis not present

## 2021-05-28 DIAGNOSIS — R030 Elevated blood-pressure reading, without diagnosis of hypertension: Secondary | ICD-10-CM | POA: Diagnosis not present

## 2021-05-28 DIAGNOSIS — Z6829 Body mass index (BMI) 29.0-29.9, adult: Secondary | ICD-10-CM | POA: Diagnosis not present

## 2021-05-28 DIAGNOSIS — G35 Multiple sclerosis: Secondary | ICD-10-CM | POA: Diagnosis not present

## 2021-05-29 DIAGNOSIS — F4310 Post-traumatic stress disorder, unspecified: Secondary | ICD-10-CM | POA: Diagnosis not present

## 2021-05-29 DIAGNOSIS — G35 Multiple sclerosis: Secondary | ICD-10-CM | POA: Diagnosis not present

## 2021-05-29 DIAGNOSIS — R413 Other amnesia: Secondary | ICD-10-CM | POA: Diagnosis not present

## 2021-05-29 DIAGNOSIS — R269 Unspecified abnormalities of gait and mobility: Secondary | ICD-10-CM | POA: Diagnosis not present

## 2021-05-29 DIAGNOSIS — F32A Depression, unspecified: Secondary | ICD-10-CM | POA: Diagnosis not present

## 2021-05-29 DIAGNOSIS — R252 Cramp and spasm: Secondary | ICD-10-CM | POA: Diagnosis not present

## 2021-06-03 DIAGNOSIS — F32A Depression, unspecified: Secondary | ICD-10-CM | POA: Diagnosis not present

## 2021-06-03 DIAGNOSIS — G35 Multiple sclerosis: Secondary | ICD-10-CM | POA: Diagnosis not present

## 2021-06-03 DIAGNOSIS — R413 Other amnesia: Secondary | ICD-10-CM | POA: Diagnosis not present

## 2021-06-03 DIAGNOSIS — F4310 Post-traumatic stress disorder, unspecified: Secondary | ICD-10-CM | POA: Diagnosis not present

## 2021-06-03 DIAGNOSIS — R269 Unspecified abnormalities of gait and mobility: Secondary | ICD-10-CM | POA: Diagnosis not present

## 2021-06-03 DIAGNOSIS — R252 Cramp and spasm: Secondary | ICD-10-CM | POA: Diagnosis not present

## 2021-06-06 ENCOUNTER — Other Ambulatory Visit: Payer: Self-pay

## 2021-06-06 ENCOUNTER — Ambulatory Visit (HOSPITAL_COMMUNITY)
Admission: RE | Admit: 2021-06-06 | Discharge: 2021-06-06 | Disposition: A | Discharge status: Home / Self Care | Payer: Medicare Other | Source: Ambulatory Visit | Attending: Neurology | Admitting: Neurology

## 2021-06-06 DIAGNOSIS — G319 Degenerative disease of nervous system, unspecified: Secondary | ICD-10-CM | POA: Diagnosis not present

## 2021-06-06 DIAGNOSIS — R5383 Other fatigue: Secondary | ICD-10-CM | POA: Diagnosis not present

## 2021-06-06 DIAGNOSIS — R269 Unspecified abnormalities of gait and mobility: Secondary | ICD-10-CM | POA: Diagnosis not present

## 2021-06-06 DIAGNOSIS — G9389 Other specified disorders of brain: Secondary | ICD-10-CM | POA: Diagnosis not present

## 2021-06-06 DIAGNOSIS — G35 Multiple sclerosis: Secondary | ICD-10-CM | POA: Diagnosis not present

## 2021-06-06 IMAGING — MR MR HEAD WO/W CM
15 of 17 series · 32 of 48 positions shown · IV contrast (10 ml Gadavist)
Comparison: MRI [DATE].

CLINICAL DATA: Multiple sclerosis (MS)

EXAM:
MRI HEAD WITHOUT AND WITH CONTRAST
TECHNIQUE: Multiplanar, multiecho pulse sequences of the brain and surrounding
structures were obtained without and with intravenous contrast.
CONTRAST:  10mL GADAVIST GADOBUTROL 1 MMOL/ML IV SOLN

[Series 5: DWI · axial · 3.0mm · 0.77mm/px · z∈[-58,+83]mm · 2 of 48 slices shown (1 of 6)]
[im 1/48]
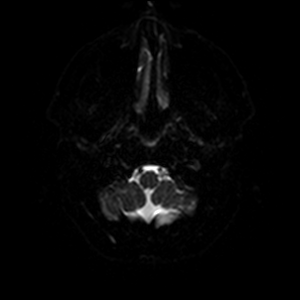
[im 48/48]
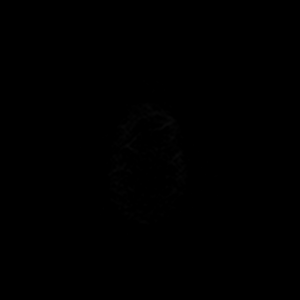

[Series 5: DWI · axial · 3.0mm · 0.77mm/px · z∈[-58,+83]mm · 2 of 48 slices shown (2 of 6)]
[im 1/48]
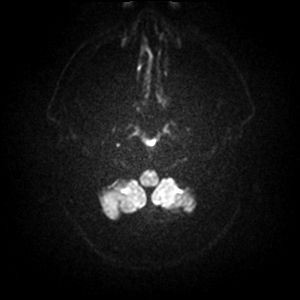
[im 48/48]
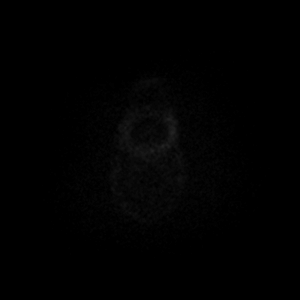

[Series 6: DWI · axial · 3.0mm · 0.77mm/px · z∈[-58,+83]mm · 3 of 48 slices shown (3 of 6)]
[im 1/48]
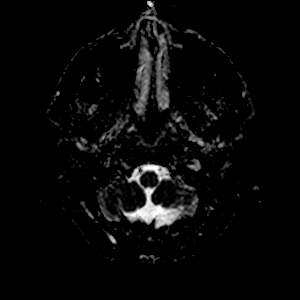
[im 24/48]
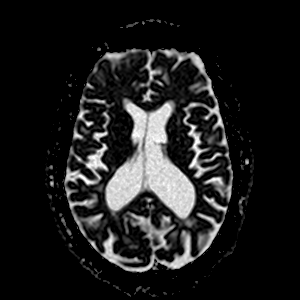
[im 48/48]
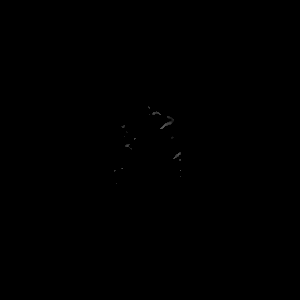

[Series 7: DWI · coronal · 5.0mm · 0.88mm/px · 2 of 30 slices shown (4 of 6)]
[im 1/30]
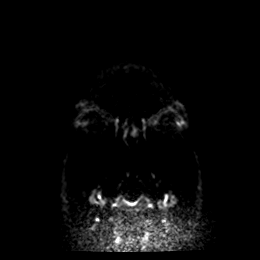
[im 30/30]
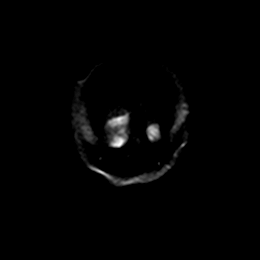

[Series 7: DWI · coronal · 5.0mm · 0.88mm/px · 2 of 30 slices shown (5 of 6)]
[im 1/30]
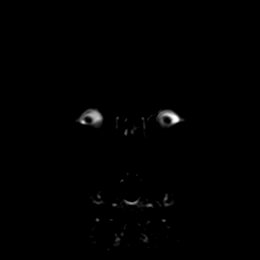
[im 30/30]
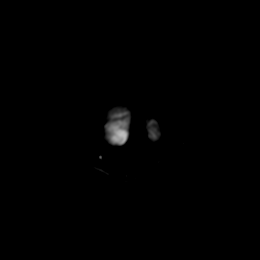

[Series 8: DWI · coronal · 5.0mm · 0.88mm/px · 2 of 30 slices shown (6 of 6)]
[im 1/30]
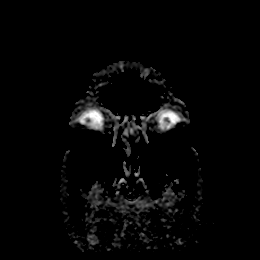
[im 30/30]
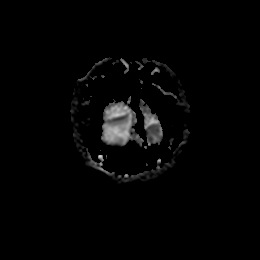

[Series 9: T1 · sagittal · 5.0mm · 0.75mm/px · 1 of 23 slices shown]
[im 1/23]
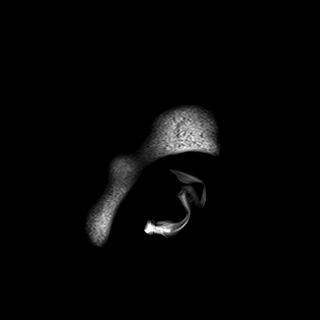

[Series 10: T2 · axial · 5.0mm · 0.72mm/px · 1 of 20 slices shown]
[im 1/20]
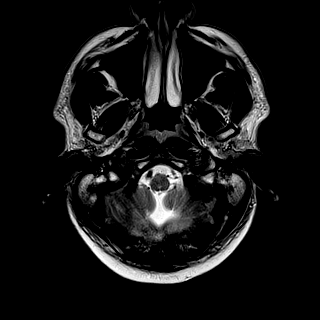

[Series 11: mag_images · axial · 3.0mm · 0.90mm/px · z∈[-75,+89]mm · 3 of 56 slices shown]
[im 1/56]
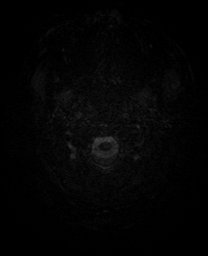
[im 28/56]
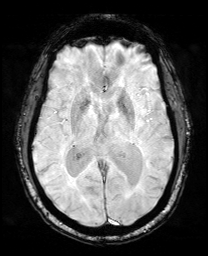
[im 56/56]
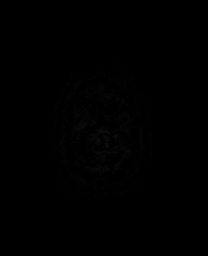

[Series 12: pha_images · axial · 3.0mm · 0.90mm/px · z∈[-75,+89]mm · 3 of 56 slices shown]
[im 1/56]
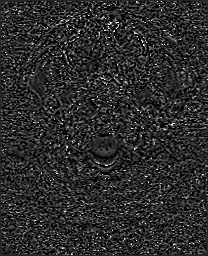
[im 28/56]
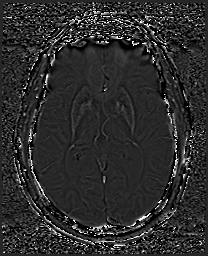
[im 56/56]
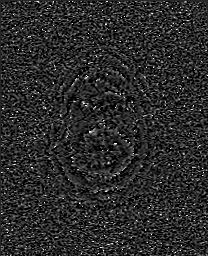

[Series 13: swi_images · axial · 3.0mm · 0.90mm/px · z∈[-75,+89]mm · 3 of 56 slices shown]
[im 1/56]
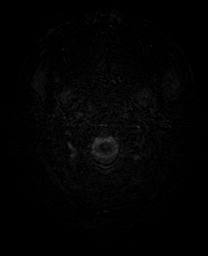
[im 28/56]
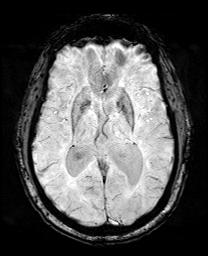
[im 56/56]
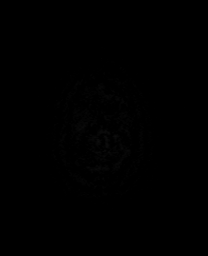

[Series 16: FLAIR · sagittal · 5.0mm · 0.94mm/px · 1 of 23 slices shown (1 of 2)]
[im 1/23]
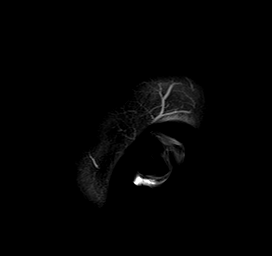

[Series 18: T2 post-contrast · coronal · 5.0mm · 0.72mm/px · 2 of 30 slices shown]
[im 1/30]
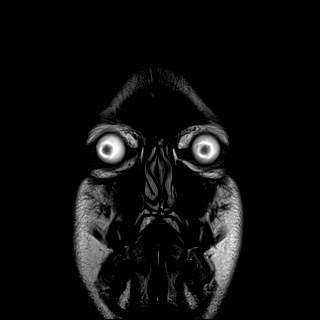
[im 30/30]
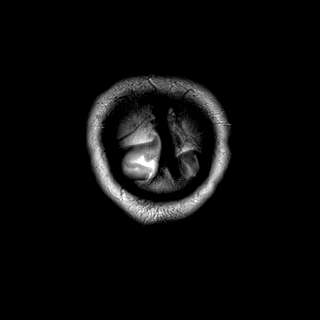

[Series 20: T1 post-contrast · coronal · 5.0mm · 0.34mm/px · 2 of 28 slices shown]
[im 1/28]
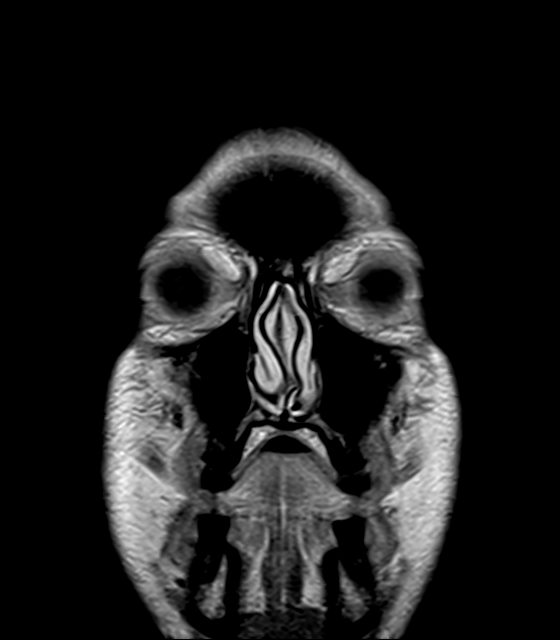
[im 28/28]
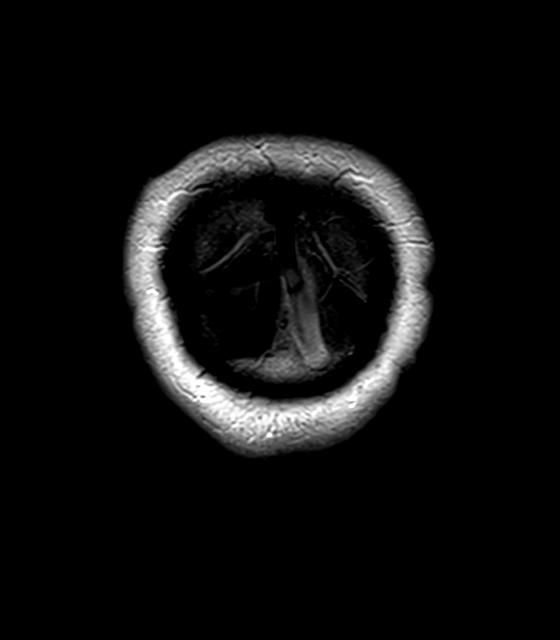

[Series 21: FLAIR · axial · 3.0mm · 0.45mm/px · z∈[-69,+83]mm · 3 of 52 slices shown (2 of 2)]
[im 1/52]
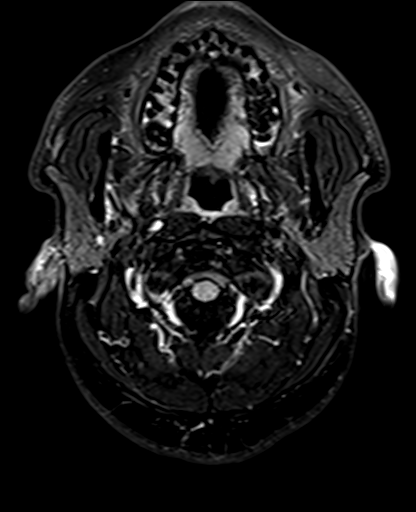
[im 26/52]
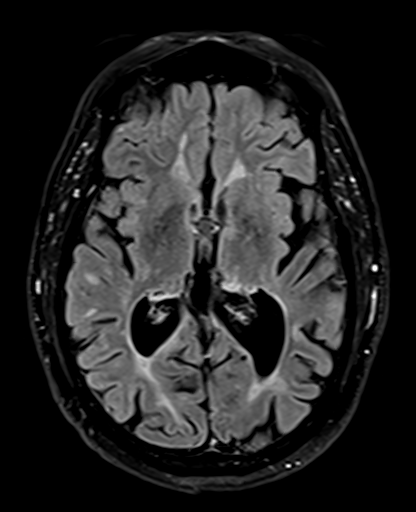
[im 52/52]
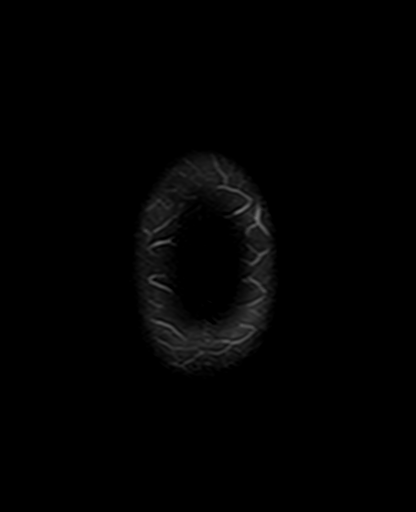

[32 of 48 positions shown; findings below may reference images not displayed]

FINDINGS: Brain: No evidence of acute infarct. Multiple areas artifactual T2
shine through on diffusion-weighted imaging. Similar extensive
periventricular, subcortical and deep T2/FLAIR hyperintensities
throughout the supratentorial and infratentorial white matter.
Multiple lesions involving the pons, unchanged. Lesions in the left
cerebellum also unchanged. No new or enhancing lesions identified.
Similar cerebral atrophy. No hydrocephalus, acute hemorrhage, mass
lesion, midline shift, or extra-axial fluid collection.

Vascular: Major arterial flow voids are maintained at the skull
base.

Skull and upper cervical spine: Normal marrow signal.

Sinuses/Orbits: Mild paranasal sinus mucosal thickening.

Other: No mastoid effusions.
IMPRESSION: 1. Similar extensive white matter lesions, compatible with multiple
sclerosis. No new or enhancing lesions identified.
2. Similar age-advanced cerebral atrophy ([KL]-[KL]).

## 2021-06-06 MED ORDER — GADOBUTROL 1 MMOL/ML IV SOLN
10.0000 mL | Freq: Once | INTRAVENOUS | Status: AC | PRN
  Administered 2021-06-06: 10 mL via INTRAVENOUS

## 2021-06-07 DIAGNOSIS — R413 Other amnesia: Secondary | ICD-10-CM | POA: Diagnosis not present

## 2021-06-07 DIAGNOSIS — R252 Cramp and spasm: Secondary | ICD-10-CM | POA: Diagnosis not present

## 2021-06-07 DIAGNOSIS — G35 Multiple sclerosis: Secondary | ICD-10-CM | POA: Diagnosis not present

## 2021-06-07 DIAGNOSIS — R269 Unspecified abnormalities of gait and mobility: Secondary | ICD-10-CM | POA: Diagnosis not present

## 2021-06-07 DIAGNOSIS — F4310 Post-traumatic stress disorder, unspecified: Secondary | ICD-10-CM | POA: Diagnosis not present

## 2021-06-07 DIAGNOSIS — F32A Depression, unspecified: Secondary | ICD-10-CM | POA: Diagnosis not present

## 2021-06-11 DIAGNOSIS — G35 Multiple sclerosis: Secondary | ICD-10-CM | POA: Diagnosis not present

## 2021-06-11 DIAGNOSIS — R252 Cramp and spasm: Secondary | ICD-10-CM | POA: Diagnosis not present

## 2021-06-11 DIAGNOSIS — R269 Unspecified abnormalities of gait and mobility: Secondary | ICD-10-CM | POA: Diagnosis not present

## 2021-06-11 DIAGNOSIS — R413 Other amnesia: Secondary | ICD-10-CM | POA: Diagnosis not present

## 2021-06-11 DIAGNOSIS — F4310 Post-traumatic stress disorder, unspecified: Secondary | ICD-10-CM | POA: Diagnosis not present

## 2021-06-11 DIAGNOSIS — F32A Depression, unspecified: Secondary | ICD-10-CM | POA: Diagnosis not present

## 2021-06-18 DIAGNOSIS — Z6829 Body mass index (BMI) 29.0-29.9, adult: Secondary | ICD-10-CM | POA: Diagnosis not present

## 2021-06-18 DIAGNOSIS — I10 Essential (primary) hypertension: Secondary | ICD-10-CM | POA: Diagnosis not present

## 2021-06-18 DIAGNOSIS — R351 Nocturia: Secondary | ICD-10-CM | POA: Diagnosis not present

## 2021-06-18 DIAGNOSIS — G35 Multiple sclerosis: Secondary | ICD-10-CM | POA: Diagnosis not present

## 2021-06-19 DIAGNOSIS — R269 Unspecified abnormalities of gait and mobility: Secondary | ICD-10-CM | POA: Diagnosis not present

## 2021-06-19 DIAGNOSIS — R252 Cramp and spasm: Secondary | ICD-10-CM | POA: Diagnosis not present

## 2021-06-19 DIAGNOSIS — F32A Depression, unspecified: Secondary | ICD-10-CM | POA: Diagnosis not present

## 2021-06-19 DIAGNOSIS — G35 Multiple sclerosis: Secondary | ICD-10-CM | POA: Diagnosis not present

## 2021-06-19 DIAGNOSIS — F4310 Post-traumatic stress disorder, unspecified: Secondary | ICD-10-CM | POA: Diagnosis not present

## 2021-06-19 DIAGNOSIS — R413 Other amnesia: Secondary | ICD-10-CM | POA: Diagnosis not present

## 2021-06-21 DIAGNOSIS — R3915 Urgency of urination: Secondary | ICD-10-CM | POA: Diagnosis not present

## 2021-06-21 DIAGNOSIS — R5383 Other fatigue: Secondary | ICD-10-CM | POA: Diagnosis not present

## 2021-06-21 DIAGNOSIS — G4700 Insomnia, unspecified: Secondary | ICD-10-CM | POA: Diagnosis not present

## 2021-06-21 DIAGNOSIS — R413 Other amnesia: Secondary | ICD-10-CM | POA: Diagnosis not present

## 2021-06-21 DIAGNOSIS — H532 Diplopia: Secondary | ICD-10-CM | POA: Diagnosis not present

## 2021-06-21 DIAGNOSIS — F32A Depression, unspecified: Secondary | ICD-10-CM | POA: Diagnosis not present

## 2021-06-21 DIAGNOSIS — Z9181 History of falling: Secondary | ICD-10-CM | POA: Diagnosis not present

## 2021-06-21 DIAGNOSIS — R269 Unspecified abnormalities of gait and mobility: Secondary | ICD-10-CM | POA: Diagnosis not present

## 2021-06-21 DIAGNOSIS — R32 Unspecified urinary incontinence: Secondary | ICD-10-CM | POA: Diagnosis not present

## 2021-06-21 DIAGNOSIS — Z87440 Personal history of urinary (tract) infections: Secondary | ICD-10-CM | POA: Diagnosis not present

## 2021-06-21 DIAGNOSIS — G35 Multiple sclerosis: Secondary | ICD-10-CM | POA: Diagnosis not present

## 2021-06-21 DIAGNOSIS — R252 Cramp and spasm: Secondary | ICD-10-CM | POA: Diagnosis not present

## 2021-06-21 DIAGNOSIS — F4310 Post-traumatic stress disorder, unspecified: Secondary | ICD-10-CM | POA: Diagnosis not present

## 2021-06-21 DIAGNOSIS — K5909 Other constipation: Secondary | ICD-10-CM | POA: Diagnosis not present

## 2021-06-27 DIAGNOSIS — F32A Depression, unspecified: Secondary | ICD-10-CM | POA: Diagnosis not present

## 2021-06-27 DIAGNOSIS — R413 Other amnesia: Secondary | ICD-10-CM | POA: Diagnosis not present

## 2021-06-27 DIAGNOSIS — R252 Cramp and spasm: Secondary | ICD-10-CM | POA: Diagnosis not present

## 2021-06-27 DIAGNOSIS — F4310 Post-traumatic stress disorder, unspecified: Secondary | ICD-10-CM | POA: Diagnosis not present

## 2021-06-27 DIAGNOSIS — R269 Unspecified abnormalities of gait and mobility: Secondary | ICD-10-CM | POA: Diagnosis not present

## 2021-06-27 DIAGNOSIS — G35 Multiple sclerosis: Secondary | ICD-10-CM | POA: Diagnosis not present

## 2021-07-03 DIAGNOSIS — R252 Cramp and spasm: Secondary | ICD-10-CM | POA: Diagnosis not present

## 2021-07-03 DIAGNOSIS — F32A Depression, unspecified: Secondary | ICD-10-CM | POA: Diagnosis not present

## 2021-07-03 DIAGNOSIS — F4310 Post-traumatic stress disorder, unspecified: Secondary | ICD-10-CM | POA: Diagnosis not present

## 2021-07-03 DIAGNOSIS — G35 Multiple sclerosis: Secondary | ICD-10-CM | POA: Diagnosis not present

## 2021-07-03 DIAGNOSIS — R413 Other amnesia: Secondary | ICD-10-CM | POA: Diagnosis not present

## 2021-07-03 DIAGNOSIS — R269 Unspecified abnormalities of gait and mobility: Secondary | ICD-10-CM | POA: Diagnosis not present

## 2021-07-22 DIAGNOSIS — L570 Actinic keratosis: Secondary | ICD-10-CM | POA: Diagnosis not present

## 2021-08-05 DIAGNOSIS — Z20822 Contact with and (suspected) exposure to covid-19: Secondary | ICD-10-CM | POA: Diagnosis not present

## 2021-08-22 ENCOUNTER — Other Ambulatory Visit: Payer: Self-pay | Admitting: Neurology

## 2021-08-22 NOTE — Telephone Encounter (Signed)
Rx refilled as per last office visit note. 

## 2021-08-26 DIAGNOSIS — Z20822 Contact with and (suspected) exposure to covid-19: Secondary | ICD-10-CM | POA: Diagnosis not present

## 2021-09-03 DIAGNOSIS — I10 Essential (primary) hypertension: Secondary | ICD-10-CM | POA: Diagnosis not present

## 2021-09-03 DIAGNOSIS — Z6830 Body mass index (BMI) 30.0-30.9, adult: Secondary | ICD-10-CM | POA: Diagnosis not present

## 2021-09-03 DIAGNOSIS — G35 Multiple sclerosis: Secondary | ICD-10-CM | POA: Diagnosis not present

## 2021-11-28 ENCOUNTER — Encounter: Payer: Self-pay | Admitting: *Deleted

## 2021-11-28 ENCOUNTER — Other Ambulatory Visit: Payer: Self-pay | Admitting: *Deleted

## 2021-11-28 DIAGNOSIS — G35 Multiple sclerosis: Secondary | ICD-10-CM

## 2021-11-28 NOTE — Patient Outreach (Signed)
  Care Coordination   Initial Visit Note   11/28/2021 Name: Lawrence Lamb MRN: [id number] DOB: [birth year]  Lawrence Lamb is a 51 y.o. year old male who sees [facility], [place], [place] for primary care. I spoke with  Lawrence Lamb by phone today  What matters to the patients health and wellness today?  Maintaining activity level due to MS and  possibly getting patient involved in adult day care programs.  Had PT earlier this year, she does not want him to regress without being active.  Report AWV is scheduled for November 16.      Goals Addressed               This Visit's Progress     Stay active, decreasing risk of immobility with MS (pt-stated)        Care Coordination Interventions: Reviewed scheduled/upcoming provider appointments including AWV reportedly scheduled for November and neurology in January Care Guide referral for [employer] (adult day care, transportation) Discussed plans with patient for ongoing care management follow up and provided patient with direct contact information for care management team Advised patient to discuss restarting PT if needed with provider Assessed social determinant of health barriers         SDOH assessments and interventions completed:  Yes  SDOH Interventions Today    Flowsheet Row Most Recent Value  SDOH Interventions   Transportation Interventions Other (Comment)        Care Coordination Interventions Activated:  Yes  Care Coordination Interventions:  Yes, provided   Follow up plan: Follow up call scheduled for 9/15    Encounter Outcome:  Pt. Visit Completed   Lawrence David, RN, MSN, [facility] Coordinator [phone]

## 2021-11-29 ENCOUNTER — Telehealth: Payer: Self-pay

## 2021-11-29 NOTE — Telephone Encounter (Signed)
   Telephone encounter was:  Successful.  11/29/2021 Name: Lawrence Lamb MRN: [id number] DOB: 1970/11/17  Lawrence Lamb is a 51 y.o. year old male who is a primary care patient of [name], [place] . The community resource team was consulted for assistance with Transportation Needs  and adult day care programs.  Care guide performed the following interventions: Received email confirming receipt of information sent from patient's wife.   Follow Up Plan:  No further follow up planned at this time. The patient has been provided with needed resources.  Dwaine Pringle, AAS Paralegal, [facility]  [address], [place] [address] ??[email or web address]  ?? [phone]   [email or web address]

## 2021-11-29 NOTE — Telephone Encounter (Signed)
   Telephone encounter was:  Successful.  11/29/2021 Name: Lawrence Lamb MRN: [id number] DOB: 05/03/1970  Lawrence Lamb is a 51 y.o. year old male who is a primary care patient of [name], [place] . The community resource team was consulted for assistance with Transportation Needs  and Caregiver Stress  Care guide performed the following interventions: Spoke with patient's spouse Lawrence Lamb, verified email address [email or web address]. Sent information for [employer] and [employer] transportation, [employer], [employer] and [place].  Follow Up Plan:  Care guide will follow up with patient by phone over the next 7 days.  Lawrence Lamb, AAS Paralegal, [place] [facility]  [address], [place] [address] ??[name]  ?? [phone]   [email or web address]

## 2022-01-03 ENCOUNTER — Ambulatory Visit: Payer: Self-pay | Admitting: *Deleted

## 2022-01-03 NOTE — Patient Instructions (Signed)
Visit Information  Thank you for taking time to visit with me today. Please don't hesitate to contact me if I can be of assistance to you.  Following are the goals we discussed today:  Continue discussing resources, contact [facility] if decision made to participate or have any questions/concerns.   Please call the Suicide and Crisis Lifeline: 988 call the [place] National Suicide Prevention Lifeline: 5197402054 or TTY: (215) 178-2450 TTY 9565075513) to talk to a trained counselor call 1-800-273-TALK (toll free, 24 hour hotline) call the [facility]): [phone] call 911 if you are experiencing a Mental Health or [place] or need someone to talk to.  Patient verbalizes understanding of instructions and care plan provided today and agrees to view in [place]. Active MyChart status and patient understanding of how to access instructions and care plan via MyChart confirmed with patient.     The patient has been provided with contact information for the care management team and has been advised to call with any health related questions or concerns.   [name], RN, MSN, [place] [facility] Management Care Management Coordinator [phone]

## 2022-01-03 NOTE — Patient Outreach (Signed)
  Care Coordination   Follow Up Visit Note   01/03/2022 Name: Abie Cheek MRN: [id number] DOB: 1970-06-21  Kalem Rockwell is a 51 y.o. year old male who sees [place], [place], [place] for primary care. I spoke with [name], wife of Marquin Patino by phone today.  What matters to the patients health and wellness today?  Wife report she has discussed resources (adult day centers, etc) with member.  He has declined to participate at this time.  She and family will continue to speak with member as they feel he would benefit from programs.  Denies any urgent concerns, encouraged to contact this care manager with questions.     Goals Addressed               This Visit's Progress     COMPLETED: Stay active, decreasing risk of immobility with MS (pt-stated)   On track     Care Coordination Interventions: Reviewed scheduled/upcoming provider appointments including AWV reportedly scheduled for November and neurology in January Discussed plans with patient for ongoing care management follow up and provided patient with direct contact information for care management team Confirmed contact with Care Guide, information on resources received         SDOH assessments and interventions completed:  No     Care Coordination Interventions Activated:  Yes  Care Coordination Interventions:  Yes, provided   Follow up plan: No further intervention required.   Encounter Outcome:  Pt. Visit Completed   [name], RN, MSN, [place] Care Management Care Management Coordinator [phone]

## 2022-01-21 DIAGNOSIS — R5081 Fever presenting with conditions classified elsewhere: Secondary | ICD-10-CM | POA: Diagnosis not present

## 2022-01-21 DIAGNOSIS — U071 COVID-19: Secondary | ICD-10-CM | POA: Diagnosis not present

## 2022-01-21 DIAGNOSIS — R410 Disorientation, unspecified: Secondary | ICD-10-CM | POA: Diagnosis not present

## 2022-01-21 DIAGNOSIS — R4182 Altered mental status, unspecified: Secondary | ICD-10-CM | POA: Diagnosis not present

## 2022-01-21 DIAGNOSIS — R509 Fever, unspecified: Secondary | ICD-10-CM | POA: Diagnosis not present

## 2022-01-21 DIAGNOSIS — R000 Tachycardia, unspecified: Secondary | ICD-10-CM | POA: Diagnosis not present

## 2022-01-21 DIAGNOSIS — G35 Multiple sclerosis: Secondary | ICD-10-CM | POA: Diagnosis not present

## 2022-01-21 DIAGNOSIS — I6381 Other cerebral infarction due to occlusion or stenosis of small artery: Secondary | ICD-10-CM | POA: Diagnosis not present

## 2022-01-30 DIAGNOSIS — G35 Multiple sclerosis: Secondary | ICD-10-CM | POA: Diagnosis not present

## 2022-01-30 DIAGNOSIS — Z79899 Other long term (current) drug therapy: Secondary | ICD-10-CM | POA: Diagnosis not present

## 2022-03-06 DIAGNOSIS — Z6828 Body mass index (BMI) 28.0-28.9, adult: Secondary | ICD-10-CM | POA: Diagnosis not present

## 2022-03-06 DIAGNOSIS — Z0001 Encounter for general adult medical examination with abnormal findings: Secondary | ICD-10-CM | POA: Diagnosis not present

## 2022-03-06 DIAGNOSIS — R269 Unspecified abnormalities of gait and mobility: Secondary | ICD-10-CM | POA: Diagnosis not present

## 2022-03-06 DIAGNOSIS — I10 Essential (primary) hypertension: Secondary | ICD-10-CM | POA: Diagnosis not present

## 2022-03-06 DIAGNOSIS — G35 Multiple sclerosis: Secondary | ICD-10-CM | POA: Diagnosis not present

## 2022-03-28 ENCOUNTER — Other Ambulatory Visit: Payer: Self-pay | Admitting: Neurology

## 2022-04-02 ENCOUNTER — Telehealth: Payer: Self-pay | Admitting: Neurology

## 2022-04-02 DIAGNOSIS — G35 Multiple sclerosis: Secondary | ICD-10-CM

## 2022-04-02 DIAGNOSIS — R269 Unspecified abnormalities of gait and mobility: Secondary | ICD-10-CM

## 2022-04-02 NOTE — Telephone Encounter (Signed)
Pt wife is calling. Stated she wants to talk to nurse about pt falling. She is requesting a call back from nurse.

## 2022-04-03 MED ORDER — METHYLPREDNISOLONE 4 MG PO TBPK: ORAL_TABLET | ORAL | 0 refills | Status: DC

## 2022-04-03 NOTE — Telephone Encounter (Signed)
I talked with his wife [name], patient was at his baseline until 2 weeks ago, no clear trigger noted, he began to have increased gait abnormality, difficulty lifting up his right leg, fell few times,  They are taking a flight trip to [place] to visit his wife's family on April 06, 2022  I ordered chest x-ray, laboratory evaluation, to be done at [facility] outpatient, also e-prescribed Medrol pack,  [name], will check on MyChart lab result, will call on-call physician if there are any significant abnormality noted.

## 2022-04-03 NOTE — Telephone Encounter (Signed)
I called the pt's wife back. She reports over the last five weeks the pt has been falling more and both legs seem weaker. He is walking with a cane/or walker all of the time, but falls continue to take place.  He is leaving to go out of town on 04/06/2022 and will be gone for 3 weeks.   Pt's wife is unsure if there is anything that would help him but in the past prednisone has help with his MS flareups.  She has not noticed any other symptoms besides increased weakness.   I advised I would send a message to Dr. Krista Blue on this, but we may be limited in what we can do since his last f/u was 05/20/2021 and he cannot come in for eval.

## 2022-04-03 NOTE — Addendum Note (Signed)
Addended by: Marcial Pacas on: 04/03/2022 04:48 PM   Modules accepted: Orders

## 2022-04-04 ENCOUNTER — Ambulatory Visit (HOSPITAL_COMMUNITY)
Admission: RE | Admit: 2022-04-04 | Discharge: 2022-04-04 | Disposition: A | Discharge status: Home / Self Care | Payer: Medicare Other | Source: Ambulatory Visit | Attending: Neurology | Admitting: Neurology

## 2022-04-04 ENCOUNTER — Other Ambulatory Visit (HOSPITAL_COMMUNITY)
Admission: RE | Admit: 2022-04-04 | Discharge: 2022-04-04 | Disposition: A | Discharge status: Home / Self Care | Payer: Medicare Other | Source: Ambulatory Visit | Attending: Neurology | Admitting: Neurology

## 2022-04-04 DIAGNOSIS — G35 Multiple sclerosis: Secondary | ICD-10-CM

## 2022-04-04 DIAGNOSIS — R269 Unspecified abnormalities of gait and mobility: Secondary | ICD-10-CM | POA: Diagnosis not present

## 2022-04-04 DIAGNOSIS — R531 Weakness: Secondary | ICD-10-CM | POA: Diagnosis not present

## 2022-04-04 LAB — CBC WITH DIFFERENTIAL/PLATELET
Abs Immature Granulocytes: 0.01 10*3/uL (ref 0.00–0.07)
Basophils Absolute: 0 10*3/uL (ref 0.0–0.1)
Basophils Relative: 1 %
Eosinophils Absolute: 0.1 10*3/uL (ref 0.0–0.5)
Eosinophils Relative: 1 %
HCT: 42.4 % (ref 39.0–52.0)
Hemoglobin: 13.8 g/dL (ref 13.0–17.0)
Immature Granulocytes: 0 %
Lymphocytes Relative: 22 %
Lymphs Abs: 1 10*3/uL (ref 0.7–4.0)
MCH: 29.9 pg (ref 26.0–34.0)
MCHC: 32.5 g/dL (ref 30.0–36.0)
MCV: 91.8 fL (ref 80.0–100.0)
Monocytes Absolute: 0.3 10*3/uL (ref 0.1–1.0)
Monocytes Relative: 6 %
Neutro Abs: 3.4 10*3/uL (ref 1.7–7.7)
Neutrophils Relative %: 70 %
Platelets: 271 10*3/uL (ref 150–400)
RBC: 4.62 MIL/uL (ref 4.22–5.81)
RDW: 12.5 % (ref 11.5–15.5)
WBC: 4.8 10*3/uL (ref 4.0–10.5)
nRBC: 0 % (ref 0.0–0.2)

## 2022-04-04 LAB — COMPREHENSIVE METABOLIC PANEL
ALT: 14 U/L (ref 0–44)
AST: 19 U/L (ref 15–41)
Albumin: 4.4 g/dL (ref 3.5–5.0)
Alkaline Phosphatase: 54 U/L (ref 38–126)
Anion gap: 6 (ref 5–15)
BUN: 11 mg/dL (ref 6–20)
CO2: 26 mmol/L (ref 22–32)
Calcium: 9.1 mg/dL (ref 8.9–10.3)
Chloride: 107 mmol/L (ref 98–111)
Creatinine, Ser: 0.93 mg/dL (ref 0.61–1.24)
GFR, Estimated: >60 mL/min (ref >60)
Glucose, Bld: 115 mg/dL — ABNORMAL HIGH (ref 70–99)
Potassium: 3.9 mmol/L (ref 3.5–5.1)
Sodium: 139 mmol/L (ref 135–145)
Total Bilirubin: 0.5 mg/dL (ref 0.3–1.2)
Total Protein: 7.5 g/dL (ref 6.5–8.1)

## 2022-04-04 LAB — URINALYSIS, ROUTINE W REFLEX MICROSCOPIC
Bilirubin Urine: NEGATIVE
Glucose, UA: NEGATIVE mg/dL
Hgb urine dipstick: NEGATIVE
Ketones, ur: 5 mg/dL — AB
Leukocytes,Ua: NEGATIVE
Nitrite: NEGATIVE
Protein, ur: NEGATIVE mg/dL
Specific Gravity, Urine: 1.023 (ref 1.005–1.030)
pH: 5 (ref 5.0–8.0)

## 2022-04-08 NOTE — Telephone Encounter (Signed)
I called patient's wife, [name], per DPR.  I discussed the lab results and chest x-ray results from last week on MyChart.  Patient will continue with the steroid Dosepak as ordered.  Patient's wife verbalized understanding of results and recommendations.

## 2022-04-08 NOTE — Telephone Encounter (Signed)
Pt wife is calling. Requesting a call back from the nurse to get results from lab work.

## 2022-04-12 ENCOUNTER — Other Ambulatory Visit: Payer: Self-pay | Admitting: Neurology

## 2022-04-12 DIAGNOSIS — G35 Multiple sclerosis: Secondary | ICD-10-CM

## 2022-04-12 DIAGNOSIS — R269 Unspecified abnormalities of gait and mobility: Secondary | ICD-10-CM

## 2022-04-14 ENCOUNTER — Other Ambulatory Visit: Payer: Self-pay | Admitting: Neurology

## 2022-05-06 ENCOUNTER — Other Ambulatory Visit: Payer: Self-pay | Admitting: Neurology

## 2022-05-20 ENCOUNTER — Ambulatory Visit (INDEPENDENT_AMBULATORY_CARE_PROVIDER_SITE_OTHER): Payer: Medicare Other | Admitting: Neurology

## 2022-05-20 ENCOUNTER — Encounter: Payer: Self-pay | Admitting: Neurology

## 2022-05-20 ENCOUNTER — Telehealth: Payer: Self-pay | Admitting: Neurology

## 2022-05-20 VITALS — BP 128/83 | HR 116 | Ht 71.0 in | Wt 208.0 lb

## 2022-05-20 DIAGNOSIS — R5383 Other fatigue: Secondary | ICD-10-CM | POA: Diagnosis not present

## 2022-05-20 DIAGNOSIS — G35 Multiple sclerosis: Secondary | ICD-10-CM | POA: Diagnosis not present

## 2022-05-20 DIAGNOSIS — R300 Dysuria: Secondary | ICD-10-CM | POA: Diagnosis not present

## 2022-05-20 DIAGNOSIS — R351 Nocturia: Secondary | ICD-10-CM | POA: Diagnosis not present

## 2022-05-20 DIAGNOSIS — F32A Depression, unspecified: Secondary | ICD-10-CM | POA: Diagnosis not present

## 2022-05-20 DIAGNOSIS — R269 Unspecified abnormalities of gait and mobility: Secondary | ICD-10-CM

## 2022-05-20 MED ORDER — BACLOFEN 10 MG PO TABS: 10.0000 mg | ORAL_TABLET | Freq: Three times a day (TID) | ORAL | 3 refills | Status: DC

## 2022-05-20 MED ORDER — QUETIAPINE FUMARATE 25 MG PO TABS: 25.0000 mg | ORAL_TABLET | Freq: Every day | ORAL | 5 refills | Status: DC

## 2022-05-20 MED ORDER — DULOXETINE HCL 60 MG PO CPEP: 60.0000 mg | ORAL_CAPSULE | Freq: Every day | ORAL | 3 refills | Status: DC

## 2022-05-20 MED ORDER — DALFAMPRIDINE ER 10 MG PO TB12: 10.0000 mg | ORAL_TABLET | Freq: Two times a day (BID) | ORAL | 11 refills | Status: DC

## 2022-05-20 NOTE — Patient Instructions (Signed)
Referral to sleep consult at our office  Referral to in home physical therapy Continue other medications See you back in 6 months

## 2022-05-20 NOTE — Telephone Encounter (Signed)
[facility] is taking this patient 

## 2022-05-20 NOTE — Progress Notes (Signed)
ASSESSMENT AND PLAN 52 y.o. year old male   54.  Relapsing remitting Multiple Sclerosis -Lawrence Lamb, finished 2nd dose in September 2018 -MRI of the brain in February 2023 continues to show extensive white matter lesions, no new or enhancing lesions, there is age advanced cerebral atrophy -Second opinion at [facility] in October 2023 with Dr. George Lamb, started on amantadine for fatigue, continue baclofen, Ampyra, recommended sleep study  2.  Worsening memory loss -Feels overall stable, can repeat MoCA at next visit -Laboratory evaluation showed no treatable etiology January 2023 -Most consistent with his advanced relapsing remitting multiple sclerosis, evidence of brain atrophy,  3.  Gait Abnormality  -Continue Ampyra, baclofen -Referral to home health physical therapy  4.  Depression/PTSD insomnia -Currently under good control, will continue Seroquel 25 mg at bedtime, Cymbalta 60 mg daily  5.  Fatigue -Continue amantadine 100 mg daily, has been helpful -Referral for sleep consult rule out sleep apnea based on report of snoring, apnea spells  6.  Urinary urgency, chronic constipation -No longer taking oxybutynin -Utilizes lactulose regularly  DIAGNOSTIC DATA (LABS, IMAGING, TESTING) - I reviewed patient records, labs, notes, testing and imaging myself where available.  HISTORY OF PRESENT ILLNESS:Lawrence Lamb is a 52 years old right-handed [place] American male, follow-up for relapsing remediating multiple sclerosis   He moved from [place] to [place] in 2013, was previously under the care of [facility] specialist Dr. [name],   He was diagnosed with relapsing remitting multiple sclerosis in early 2007, presenting with right face, right leg weakness, has mild gait difficulty, diagnosis was confirmed by marked abnormal MRI of brain, and cervical lesions    He was initially treated with Betaseron for 18 months, clinically he was stable, but there was enhancing MRI lesions  on Betaseron, he developed positive interferon antibodies.  Tysabri was started since February 2009, initially every 4 weeks, later stretching to every 6 weeks, JC virus antibody was negative in September 2009 and April 2010, but JC virus antibody began to be positive since September 2010, he tolerates the [name] infusion very well from Feb 2009 till Jan 2013. He has repeat MRI of brain every 6 months.     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  He was treated with [name] from January 24th, 2014 for 6 months, but he continued to have slow progressive worsening gait difficulty, fell multiple times, had few rounds of steroid.  Abnormal visual evoked response test secondary to prolongation of the P100 waveform on the left.     He now complains of constant gait difficulty, bilateral lower extremity spasticity, also complains of urinary incontinence, double vision, he could no longer driving,  also complains of worsening memory trouble, difficulty focusing, could not take care of his young children by himself.      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, index value is 4.15 in June 2014, 2.66 in 11/22/2013   I have discussed treatment options with patient and his wife, he had progressive worsening gait difficulty, memory trouble, vision problem since he was off [name], taking [name] treatment,  His symptoms were relatively stable while taking Lawrence Lamb is 10/998 chance of PML,  He restarted on [name] infusion  since Oct 14th 2014  UPDATE Feb 11th 2016:YY His walking has much improved with Ampyra twice a day, no significant side effect, he did not had MRI  of brain, cervical spine as previously scheduled,     UPDATE October 10 2014:YY He was admitted to hospital March 28-30 2016 for flulike illness, he had much worsening gait difficulty ever since, now  rely on his forefoot cane, he also complains of worsening nocturia, wake up multiple times during night, depressed he could not contribute much to his family, he is still waiting for social disability applications.    Update August the second 2016:YY He continue complains of gait difficulty, right worse than left, rely on his cane, Effexor has been helpful for his depression, complains of worsening memory trouble Anti-[name] antibody was negative. He and his wife concerned about the side effect of lemtrada, wants to stay on Tysabri.   I have reviewed and compared MRI scan in June 2016 [facility] to February 2016 MRIs, multiple supratentorium lesions, T1 black holes, no contrast enhancement, no significant change compared to previous scans,   UPDATE Feb 21 2015:YY He started to feel increaesd weakness since Nov 1st, right leg worse than left, he took a shower, his leg gave out, fell down, difficult to get out of bathtub, he also has chronic constipations    UPDATE May 09 2015:YY He was treated with one course of Medrol pack, his walking has improved some, but overall has slow progress in gait difficulty, worsening memory trouble, he can no longer babysit his young children at home, mild depression, Effexor has made his depression worse, worsening constipations, failed to improve by over-the-counter medications   UPDATE August 13 2015:Lawrence Lamb [name] infusion was in July 09 2015, he was evaluated by Dr. Felecia Lamb in March 20 eighth 2017, he is at high risk to develop complication with continued [name] infusion, in addition, he continue have progressive worsening gait difficulty on [name], frequent flareups, we decided to proceed with Lawrence Lamb OCt 16th 2017:YY He had first dose of lemtrada from July 10-14th, 2017 tolerate it well.  I have reviewed hospital admission on November 04 2015 to [facility],   Patient woke up November 03 2015 was found to be confused, had a  fever of 102, he just finished Lematrada infusion on July 14th 2017, upon presentation, blood pressure was 119/54, heart rate of 79, temperature 99.2, patient was documented as low alert, no skin rash, blood culture was sent, he was treated with Levaquin, next   CT of the chest without contrast showed minimum bilateral dependent subsegmental atelectasis, no acute abnormality, MRI of the brain with and without contrast on November 05 2015 showed advanced chronic demyelinating disease throughout the brain, unchanged from January 2017, no new lesion  X-rays was negative for pneumonia,   EKG showed sinus rhythm with heart rate of 97 Laboratory evaluation, glucose 94, creatinine 0.91, and Was mildly elevated 13 point 9, CPK was 282, WBC was 7.8, hemoglobin was 13 point 5, neutrophil was 6 5.6%, influenza a antibody was positive, UA showed negative leukoesterase, negative nitrate,   He was treated with antibiotics, IV solution, his symptoms quickly improved over 4 days, he is now at his baseline, ambulate with a walker, he does not drive, his sister stays with him during the day, he watches TV,  He complains of worsening visual problem, he was seen by optometrist, he could see close vision, but could not read far away.    UPDATE October 02 2016: He denies any significant change, he ambulate with a cane, he continue have significant memory loss, denies significant depression, mild fatigue, he attend adult  day program.  He continues to have mild to moderate constipation, bladder urgency.   He will have repeat Lematrada infusion in July 2018, 2 days following previous infusion on November 03 2016, he was admitted to the hospital for fever, symptoms quickly improved with antibiotic treatment, but UA showed negative leukoesterase, negative nitrate, CT chest showed minimum bilateral dependent actelesis, no acute abnormality  He has monthly lab by Lemetrada at the middle of month.   UPDATE Sept 6th 2018: He tolerates  second round of Lemetrada well in 2018, finished infusion last week, no fever noted. He has baseline gait abnormality, no significant worsening, also complains of mild depression anxiety insomnia taking melatonin, Ambien as needed   UPDATE September 30 2017: MRI of the brain 04/03/2017 unchanged with severe burden of demyelinating disease without active demyelinating lesions when compared to MRI in 2017.    He is overall fairly stable, laboratory evaluations was within normal range, CD4 count is trending up,  has stopped the Valtrex treatment, he fell showering April 2019 in with 2 broken ribs on the left side, now recovered, there is no significant change in his gait abnormality  Update June 27, 2019: He is accompanied by his wife at today's clinical visit, continue complains of significant gait abnormality, fell every few weeks, ambulate with a forefoot cane, continue to feel with chronic fatigue, depression, insomnia, is also under the care of [facility] for [facility], recently stopped the Ambien, started on Seroquel in February 2021, he can sleep better.  He spent most of the day sitting, watching TV, reading books, also has memory loss, rely on his wife to fix his medication box.  UPDATE Mar 07 2020: He was treated with UTI at end of September 2021, he is overall stable, continue have gait abnormality, mental slowing, fatigue, urinary urgency, on polypharmacy treatment, also carries a diagnosis depression, PTSD, Seroquel 25 mg every night has been helpful  UPDATE May 20 2021: He is accompanied by his wife at today's visit, she works from home, patient was noted to have gradual worsening gait abnormality, memory loss, could not carry on much of household obligations, sitting most of the time, do minor exercise regularly,  He continues to have nocturia, improved by oxybutynin,  Update May 20, 2022 SS: Extensive laboratory evaluation at last visit with Dr. Krista Lamb  05/20/21 (RPR, B12, folate, CRP, TSH,  CBC, CMP, sed rate ) were unremarkable.  Had MRI of the brain February 2023 showing similar extensive white matter lesions, no new lesions were seen.  Age advanced cerebral atrophy.  In December 2023 his wife called reporting onset of increased gait abnormality, difficulty lifting his right leg, few falls, laboratory and urine analysis were unremarkable.  He was treated with a Medrol pack, he went back to his baseline.  Neurology consult 01/30/22 at [facility] with Dr. George Lamb, I reviewed the note, going to try amantadine for MS fatigue, continue Ampyra baclofen, recommended obtaining sleep study. On amantadine 100 mg in AM for fatigue. He feels his attention, focus is improved. Using cane, last fall was Jan 7th. Last home health visit was in April for PT. Sleeping well through the night. No longer taking oxybutynin, was only using it PRN. Seeing PCP today to check prostate. Stopped due to no refill. Using Lactulose twice daily as needed. ESS 11.  Wife reports snoring, apnea at night.   PHYSICAL EXAM Vitals:   05/20/21 0956  BP: 133/81  Pulse: 83  Weight: 208 lb 8 oz (94.6 kg)  Height: '5\' 11"'$  (1.803 m)   Body mass index is 29.08 kg/m.  Gen: NAD, conversant, well nourised, well groomed                     Cardiovascular: Regular rate rhythm, no peripheral edema, warm, nontender. Eyes: Conjunctivae clear without exudates or hemorrhage  NEUROLOGICAL EXAM:  MENTAL STATUS: Speech/cognition:    Speech is slow, slow reaction time, very nice , pleasant     05/20/2021   10:00 AM  Montreal Cognitive Assessment   Visuospatial/ Executive (0/5) 3  Naming (0/3) 3  Attention: Read list of digits (0/2) 2  Attention: Read list of letters (0/1) 1  Attention: Serial 7 subtraction starting at 100 (0/3) 0  Language: Repeat phrase (0/2) 2  Language : Fluency (0/1) 0  Abstraction (0/2) 2  Delayed Recall (0/5) 0  Orientation (0/6) 3  Total 16     CRANIAL NERVES: CN II: Visual fields are full to  confrontation.  Pupils are round equal and briskly reactive to light. CN III, IV, VI: extraocular movement are normal. No ptosis. CN V: Facial sensation is intact CN VII: Face is symmetric with normal eye closure and smile. CN VIII: Hearing is normal to casual conversation CN IX, X: Phonation is normal. CN XI: Head turning and shoulder shrug are intact  MOTOR: Moderate bilateral lower extremity spasticity, right worse than left, mild right upper extremity weakness, mild right hip flexion weakness   REFLEXES: Reflexes are hyperreflexia, and symmetric at the biceps, triceps, knees, and ankles.  SENSORY: Intact to soft touch on the face, arms, legs  COORDINATION: Mild dysmetria with finger-nose-finger bilaterally  GAIT/STANCE: Relies on his quad cane, tends to drag the right leg  REVIEW OF SYSTEMS: Out of a complete 14 system review of symptoms, the patient complains only of the following symptoms, and all other reviewed systems are negative.  See HPI  ALLERGIES: No Known Allergies  HOME MEDICATIONS: Outpatient Medications Prior to Visit  Medication Sig Dispense Refill   amantadine (SYMMETREL) 100 MG capsule Take 100 mg by mouth 2 (two) times daily.     Cholecalciferol (VITAMIN D PO) Take by mouth 2 (two) times daily. Takes 2000units daily     lactulose (CHRONULAC) 10 GM/15ML solution TAKE 15 MLS (10 GM TOTAL) BY MOUTH 3 TIMES FOR CONSTIPATION. 4050 mL 3   methylPREDNISolone (MEDROL DOSEPAK) 4 MG TBPK tablet Take as instructed. 21 tablet 0   multivitamin-iron-minerals-folic acid (CENTRUM) chewable tablet Chew 1 tablet by mouth daily.     omega-3 acid ethyl esters (LOVAZA) 1 G capsule Take 1,000 mg by mouth 2 (two) times daily.     prazosin (MINIPRESS) 5 MG capsule Take 5 mg by mouth at bedtime.     baclofen (LIORESAL) 10 MG tablet TAKE 1 TABLET THREE TIMES DAILY 270 tablet 3   dalfampridine 10 MG TB12 TAKE 1 TABLET BY MOUTH TWICE A DAY 60 tablet 11   DULoxetine (CYMBALTA) 60 MG  capsule TAKE 1 CAPSULE EVERY DAY 90 capsule 3   QUEtiapine (SEROQUEL) 25 MG tablet TAKE 1 TABLET AT BEDTIME 30 tablet 1   Alemtuzumab 12 MG/1.2ML SOLN Inject into the vein.     loratadine (CLARITIN) 10 MG tablet Take 10 mg by mouth daily as needed.     oxybutynin (DITROPAN) 5 MG tablet Take 1 tablet (5 mg total) by mouth every 8 (eight) hours as needed for bladder spasms. TAKE ONE TABLET BY MOUTH EVERY 8 HOURS AS NEEDED FOR BLADDER SPASMS  60 tablet 11   No facility-administered medications prior to visit.    PAST MEDICAL HISTORY: Past Medical History:  Diagnosis Date   Multiple sclerosis ([place]) 2007    PAST SURGICAL HISTORY: Past Surgical History:  Procedure Laterality Date   APPENDECTOMY  52 years old   VASECTOMY  08/2013    FAMILY HISTORY: Family History  Problem Relation Age of Onset   High blood pressure Mother    High blood pressure Father     SOCIAL HISTORY: Social History   Socioeconomic History   Marital status: Married    Spouse name: Lawrence Lamb   Number of children: 2   Years of education: 12   Highest education level: Not on file  Occupational History    Comment: Disabled  Tobacco Use   Smoking status: Never   Smokeless tobacco: Never  Substance and Sexual Activity   Alcohol use: No   Drug use: No   Sexual activity: Not on file  Other Topics Concern   Not on file  Social History Narrative   Patient lives at home with his wife Lawrence Lamb). Patient has two children. Patient is disabled.   Right handed.   Caffeine- one cup daily.   Social Determinants of Health   Financial Resource Strain: Not on file  Food Insecurity: No Food Insecurity (11/28/2021)   Hunger Vital Sign    Worried About Running Out of Food in the Last Year: Never true    Ran Out of Food in the Last Year: Never true  Transportation Needs: Unmet Transportation Needs (11/28/2021)   PRAPARE - Hydrologist (Medical): No    Lack of Transportation (Non-Medical): Yes   Physical Activity: Not on file  Stress: Not on file  Social Connections: Not on file  Intimate Partner Violence: Not on file    Lawrence Lamb, [facility] [address], [place], [place] [address] [phone]

## 2022-05-26 DIAGNOSIS — G35 Multiple sclerosis: Secondary | ICD-10-CM | POA: Diagnosis not present

## 2022-05-26 DIAGNOSIS — F32A Depression, unspecified: Secondary | ICD-10-CM | POA: Diagnosis not present

## 2022-05-26 DIAGNOSIS — F4310 Post-traumatic stress disorder, unspecified: Secondary | ICD-10-CM | POA: Diagnosis not present

## 2022-05-26 DIAGNOSIS — Z9181 History of falling: Secondary | ICD-10-CM | POA: Diagnosis not present

## 2022-05-26 DIAGNOSIS — G4700 Insomnia, unspecified: Secondary | ICD-10-CM | POA: Diagnosis not present

## 2022-05-26 DIAGNOSIS — K5909 Other constipation: Secondary | ICD-10-CM | POA: Diagnosis not present

## 2022-05-26 DIAGNOSIS — R5382 Chronic fatigue, unspecified: Secondary | ICD-10-CM | POA: Diagnosis not present

## 2022-05-26 DIAGNOSIS — Z87440 Personal history of urinary (tract) infections: Secondary | ICD-10-CM | POA: Diagnosis not present

## 2022-05-29 DIAGNOSIS — R5382 Chronic fatigue, unspecified: Secondary | ICD-10-CM | POA: Diagnosis not present

## 2022-05-29 DIAGNOSIS — G35 Multiple sclerosis: Secondary | ICD-10-CM | POA: Diagnosis not present

## 2022-05-29 DIAGNOSIS — F4310 Post-traumatic stress disorder, unspecified: Secondary | ICD-10-CM | POA: Diagnosis not present

## 2022-05-29 DIAGNOSIS — G4700 Insomnia, unspecified: Secondary | ICD-10-CM | POA: Diagnosis not present

## 2022-05-29 DIAGNOSIS — F32A Depression, unspecified: Secondary | ICD-10-CM | POA: Diagnosis not present

## 2022-05-29 DIAGNOSIS — K5909 Other constipation: Secondary | ICD-10-CM | POA: Diagnosis not present

## 2022-06-02 DIAGNOSIS — G4700 Insomnia, unspecified: Secondary | ICD-10-CM | POA: Diagnosis not present

## 2022-06-02 DIAGNOSIS — R5382 Chronic fatigue, unspecified: Secondary | ICD-10-CM | POA: Diagnosis not present

## 2022-06-02 DIAGNOSIS — F32A Depression, unspecified: Secondary | ICD-10-CM | POA: Diagnosis not present

## 2022-06-02 DIAGNOSIS — G35 Multiple sclerosis: Secondary | ICD-10-CM | POA: Diagnosis not present

## 2022-06-02 DIAGNOSIS — F4310 Post-traumatic stress disorder, unspecified: Secondary | ICD-10-CM | POA: Diagnosis not present

## 2022-06-02 DIAGNOSIS — K5909 Other constipation: Secondary | ICD-10-CM | POA: Diagnosis not present

## 2022-06-05 DIAGNOSIS — K5909 Other constipation: Secondary | ICD-10-CM | POA: Diagnosis not present

## 2022-06-05 DIAGNOSIS — F4310 Post-traumatic stress disorder, unspecified: Secondary | ICD-10-CM | POA: Diagnosis not present

## 2022-06-05 DIAGNOSIS — G35 Multiple sclerosis: Secondary | ICD-10-CM | POA: Diagnosis not present

## 2022-06-05 DIAGNOSIS — R5382 Chronic fatigue, unspecified: Secondary | ICD-10-CM | POA: Diagnosis not present

## 2022-06-05 DIAGNOSIS — G4700 Insomnia, unspecified: Secondary | ICD-10-CM | POA: Diagnosis not present

## 2022-06-05 DIAGNOSIS — F32A Depression, unspecified: Secondary | ICD-10-CM | POA: Diagnosis not present

## 2022-06-09 DIAGNOSIS — R5382 Chronic fatigue, unspecified: Secondary | ICD-10-CM | POA: Diagnosis not present

## 2022-06-09 DIAGNOSIS — G4700 Insomnia, unspecified: Secondary | ICD-10-CM | POA: Diagnosis not present

## 2022-06-09 DIAGNOSIS — F4310 Post-traumatic stress disorder, unspecified: Secondary | ICD-10-CM | POA: Diagnosis not present

## 2022-06-09 DIAGNOSIS — K5909 Other constipation: Secondary | ICD-10-CM | POA: Diagnosis not present

## 2022-06-09 DIAGNOSIS — G35 Multiple sclerosis: Secondary | ICD-10-CM | POA: Diagnosis not present

## 2022-06-09 DIAGNOSIS — F32A Depression, unspecified: Secondary | ICD-10-CM | POA: Diagnosis not present

## 2022-06-13 DIAGNOSIS — F32A Depression, unspecified: Secondary | ICD-10-CM | POA: Diagnosis not present

## 2022-06-13 DIAGNOSIS — G35 Multiple sclerosis: Secondary | ICD-10-CM | POA: Diagnosis not present

## 2022-06-13 DIAGNOSIS — R5382 Chronic fatigue, unspecified: Secondary | ICD-10-CM | POA: Diagnosis not present

## 2022-06-13 DIAGNOSIS — F4310 Post-traumatic stress disorder, unspecified: Secondary | ICD-10-CM | POA: Diagnosis not present

## 2022-06-13 DIAGNOSIS — G4700 Insomnia, unspecified: Secondary | ICD-10-CM | POA: Diagnosis not present

## 2022-06-13 DIAGNOSIS — K5909 Other constipation: Secondary | ICD-10-CM | POA: Diagnosis not present

## 2022-06-16 NOTE — Progress Notes (Signed)
Chart reviewed, agree above plan ?

## 2022-06-19 DIAGNOSIS — K5909 Other constipation: Secondary | ICD-10-CM | POA: Diagnosis not present

## 2022-06-19 DIAGNOSIS — F32A Depression, unspecified: Secondary | ICD-10-CM | POA: Diagnosis not present

## 2022-06-19 DIAGNOSIS — R5382 Chronic fatigue, unspecified: Secondary | ICD-10-CM | POA: Diagnosis not present

## 2022-06-19 DIAGNOSIS — F4310 Post-traumatic stress disorder, unspecified: Secondary | ICD-10-CM | POA: Diagnosis not present

## 2022-06-19 DIAGNOSIS — G35 Multiple sclerosis: Secondary | ICD-10-CM | POA: Diagnosis not present

## 2022-06-19 DIAGNOSIS — G4700 Insomnia, unspecified: Secondary | ICD-10-CM | POA: Diagnosis not present

## 2022-06-25 DIAGNOSIS — R030 Elevated blood-pressure reading, without diagnosis of hypertension: Secondary | ICD-10-CM | POA: Diagnosis not present

## 2022-06-25 DIAGNOSIS — R059 Cough, unspecified: Secondary | ICD-10-CM | POA: Diagnosis not present

## 2022-06-26 ENCOUNTER — Telehealth: Payer: Self-pay | Admitting: Neurology

## 2022-06-26 ENCOUNTER — Other Ambulatory Visit: Payer: Self-pay

## 2022-06-26 MED ORDER — AMANTADINE HCL 100 MG PO CAPS: 100.0000 mg | ORAL_CAPSULE | Freq: Every day | ORAL | 1 refills | Status: DC

## 2022-06-26 NOTE — Telephone Encounter (Signed)
Pt's wife is calling to report pt's amantadine (SYMMETREL) 100 MG capsule, was never called in to [facility].  Wife is asking this be done

## 2022-06-26 NOTE — Telephone Encounter (Signed)
Called pt wife and let her know refill was sent to pharmacy.

## 2022-07-14 ENCOUNTER — Ambulatory Visit (INDEPENDENT_AMBULATORY_CARE_PROVIDER_SITE_OTHER): Payer: Medicare Other | Admitting: Neurology

## 2022-07-14 ENCOUNTER — Encounter: Payer: Self-pay | Admitting: Neurology

## 2022-07-14 VITALS — BP 90/60 | HR 120 | Ht 71.0 in | Wt 198.0 lb

## 2022-07-14 DIAGNOSIS — R351 Nocturia: Secondary | ICD-10-CM

## 2022-07-14 DIAGNOSIS — R000 Tachycardia, unspecified: Secondary | ICD-10-CM

## 2022-07-14 DIAGNOSIS — G35 Multiple sclerosis: Secondary | ICD-10-CM

## 2022-07-14 DIAGNOSIS — R0683 Snoring: Secondary | ICD-10-CM

## 2022-07-14 DIAGNOSIS — E663 Overweight: Secondary | ICD-10-CM | POA: Diagnosis not present

## 2022-07-14 DIAGNOSIS — G4719 Other hypersomnia: Secondary | ICD-10-CM

## 2022-07-14 NOTE — Progress Notes (Signed)
Subjective:    Patient ID: Lawrence Lamb is a 52 y.o. male.  HPI    Star Age, MD, PhD [facility] [address] [place], [place] [address]  Dear Lawrence Lamb and Lawrence Lamb,  I saw your patient, Lawrence Lamb, upon your kind request in my sleep clinic today for initial consultation of his sleep disorder, in particular, concern for underlying obstructive sleep apnea.  The patient is accompanied by his wife today.  As you know, [name] is a 52 year old male with an underlying medical history of multiple sclerosis, depression, and overweight state, who reports snoring and excessive daytime somnolence as well as witnessed apneas.Marland Kitchen  His Epworth sleepiness score is 12 out of 24, fatigue severity score is 37 out of 63.  I reviewed your office note from 05/20/2022.  He does not go to bed at a scheduled time, his wife has to encourage him to go to bed before 11.  He may sleep till 10 or noon.  He has nocturia about 2-3 times per night, denies recurrent nocturnal or morning headaches.  His mom snores, wife reports that his sister was supposed to get checked for sleep apnea as well.  His snoring is loud and she has noticed pauses in his breathing while he is asleep.  He drinks caffeine in the form of coffee, 2 cups in the morning, typically no soda.  He is tachycardic today and did not have any excessive caffeine, blood pressure is on the lower side, he has no lightheadedness or other symptoms.  His wife reports that he has had a UTI before and never had symptoms from it.  She is willing to take him to PCP or urgent care for checkup.  Weight has been more or less stable.  His Past Medical History Is Significant For: Past Medical History:  Diagnosis Date   Multiple sclerosis ([place]) 2007    His Past Surgical History Is Significant For: Past Surgical History:  Procedure Laterality Date   APPENDECTOMY  52 years old   VASECTOMY  08/2013    His Family History Is  Significant For: Family History  Problem Relation Age of Onset   High blood pressure Mother    High blood pressure Father     His Social History Is Significant For: Social History   Socioeconomic History   Marital status: Married    Spouse name: [name]   Number of children: 2   Years of education: 12   Highest education level: Not on file  Occupational History    Comment: Disabled  Tobacco Use   Smoking status: Never   Smokeless tobacco: Never  Substance and Sexual Activity   Alcohol use: No   Drug use: No   Sexual activity: Not on file  Other Topics Concern   Not on file  Social History Narrative   Patient lives at home with his wife Lawrence Lamb). Patient has two children. Patient is disabled.   Right handed.   Caffeine- one cup daily.   Social Determinants of Health   Financial Resource Strain: Not on file  Food Insecurity: No Food Insecurity (11/28/2021)   Hunger Vital Sign    Worried About Running Out of Food in the Last Year: Never true    Ran Out of Food in the Last Year: Never true  Transportation Needs: Unmet Transportation Needs (11/28/2021)   PRAPARE - Hydrologist (Medical): No    Lack of Transportation (Non-Medical): Yes  Physical Activity:  Not on file  Stress: Not on file  Social Connections: Not on file    His Allergies Are:  No Known Allergies:   His Current Medications Are:  Outpatient Encounter Medications as of 07/14/2022  Medication Sig   amantadine (SYMMETREL) 100 MG capsule Take 1 capsule (100 mg total) by mouth daily.   baclofen (LIORESAL) 10 MG tablet Take 1 tablet (10 mg total) by mouth 3 (three) times daily.   Cholecalciferol (VITAMIN D PO) Take by mouth 2 (two) times daily. Takes 2000units daily   dalfampridine 10 MG TB12 Take 1 tablet (10 mg total) by mouth 2 (two) times daily.   DULoxetine (CYMBALTA) 60 MG capsule Take 1 capsule (60 mg total) by mouth daily.   lactulose (CHRONULAC) 10 GM/15ML solution TAKE 15 MLS  (10 GM TOTAL) BY MOUTH 3 TIMES FOR CONSTIPATION.   multivitamin-iron-minerals-folic acid (CENTRUM) chewable tablet Chew 1 tablet by mouth daily.   omega-3 acid ethyl esters (LOVAZA) 1 G capsule Take 1,000 mg by mouth 2 (two) times daily.   prazosin (MINIPRESS) 5 MG capsule Take 5 mg by mouth at bedtime.   QUEtiapine (SEROQUEL) 25 MG tablet Take 1 tablet (25 mg total) by mouth at bedtime.   [DISCONTINUED] methylPREDNISolone (MEDROL DOSEPAK) 4 MG TBPK tablet Take as instructed. (Patient not taking: Reported on 07/14/2022)   No facility-administered encounter medications on file as of 07/14/2022.  :   Review of Systems:  Out of a complete 14 point review of systems, all are reviewed and negative with the exception of these symptoms as listed below:  Review of Systems  Neurological:        Referral from Lawrence Roch, NP  for sleep issues.  Snores, and has witness apnea from.  ESS  12 FSS 37.      Objective:  Neurological Exam  Physical Exam Physical Examination:   Vitals:   07/14/22 0848  BP: 92/63  Pulse: (!) 119    General Examination: The patient is a very pleasant 52 y.o. male in no acute distress. He appears well-developed and well-nourished and well groomed.   HEENT: Normocephalic, atraumatic, pupils are equal, round and reactive to light, extraocular tracking is good without limitation to gaze excursion or nystagmus noted. Hearing is grossly intact. Face is symmetric with normal facial animation, puffy eyelids noted, mild ptosis bilaterally. Speech is mildly hypophonic and mildly dysarthric.  There is no lip, neck/head, jaw or voice tremor. Neck is supple with full range of passive and active motion. There are no carotid bruits on auscultation. Oropharynx exam reveals: moderate mouth dryness, adequate dental hygiene and moderate airway crowding, due to smaller airway entry, tonsils of 1-2+ on R and 1+ on the L. Mallampati is class III. Tongue protrudes centrally and palate elevates  symmetrically.  Neck circumference 17 inches, minimal overbite with slightly skewed teeth alignment.   Chest: Clear to auscultation without wheezing, rhonchi or crackles noted.  Heart: S1+S2+0, regular and normal without murmurs, rubs or gallops noted.   Abdomen: Soft, non-tender and non-distended.  Extremities: There is no pitting edema in the distal lower extremities bilaterally.   Skin: Warm and dry without trophic changes noted.   Musculoskeletal: exam reveals no obvious joint deformities.   Neurologically:  Mental status: The patient is awake, alert and oriented but does not provide details to his history.  Speech is scant.  Slowness in responding noted. Mood is normal and affect is normal.  Cranial nerves II - XII are as described above under HEENT exam.  Motor exam: Normal bulk, global strength of 4 out of 5, he is stronger on the left and weaker on the right, particularly right lower extremity.  There is no obvious action or resting tremor.  Fine motor skills and coordination: Mild incoordination in both upper extremities, right more than left.    Cerebellar testing: Mild dysmetria noted.  Sensory exam: intact to light touch in the upper and lower extremities.  Gait, station and balance: He stands slowly, he has a 4-point cane and also brought his electric scooter.  Assessment and Plan:  In summary, Elworth Laubenstein is a very pleasant 52 y.o.-year old male with an underlying medical history of multiple sclerosis, depression, and overweight state, whose history and physical exam are concerning for sleep disordered breathing,, particularly obstructive sleep apnea (OSA). A laboratory attended sleep study is typically considered "gold standard" for evaluation of sleep disordered breathing.   I had a long chat with the patient and his wife about my findings and the diagnosis of sleep apnea , particularly OSA, its prognosis and treatment options. We talked about medical/conservative  treatments, surgical interventions and non-pharmacological approaches for symptom control. I explained, in particular, the risks and ramifications of untreated moderate to severe OSA, especially with respect to developing cardiovascular disease down the road, including congestive heart failure (CHF), difficult to treat hypertension, cardiac arrhythmias (particularly A-fib), neurovascular complications including TIA, stroke and dementia. Even type 2 diabetes has, in part, been linked to untreated OSA. Symptoms of untreated OSA may include (but may not be limited to) daytime sleepiness, nocturia (i.e. frequent nighttime urination), memory problems, mood irritability and suboptimally controlled or worsening mood disorder such as depression and/or anxiety, lack of energy, lack of motivation, physical discomfort, as well as recurrent headaches, especially morning or nocturnal headaches. We talked about the importance of maintaining a healthy lifestyle and striving for healthy weight. In addition, we talked about the importance of striving for and maintaining good sleep hygiene.  He is advised to get checked by PCP today if possible for tachycardia.  He has had a UTI before, blood pressure is on the lower side as well.  I would recommend blood work, urinalysis, and EKG.  If not possible through PCP, they are advised to go to urgent care.  I recommended a sleep study at this time. I outlined the differences between a laboratory attended sleep study which is considered more comprehensive and accurate over the option of a home sleep test (HST); the latter may lead to underestimation of sleep disordered breathing in some instances and does not help with diagnosing upper airway resistance syndrome and is not accurate enough to diagnose primary central sleep apnea typically. I outlined possible surgical and non-surgical treatment options of OSA, including the use of a positive airway pressure (PAP) device (i.e. CPAP,  AutoPAP/APAP or BiPAP in certain circumstances), a custom-made dental device (aka oral appliance, which would require a referral to a specialist dentist or orthodontist typically, and is generally speaking not considered for patients with full dentures or edentulous state), upper airway surgical options, such as traditional UPPP (which is not considered a first-line treatment) or the Inspire device (hypoglossal nerve stimulator, which would involve a referral for consultation with an ENT surgeon, after careful selection, following inclusion criteria - also not first-line treatment). I explained the PAP treatment option to the patient in detail, as this is generally considered first-line treatment.  The patient indicated that he would be willing to try PAP therapy, if the need arises. I  explained the importance of being compliant with PAP treatment, not only for insurance purposes but primarily to improve patient's symptoms symptoms, and for the patient's long term health benefit, including to reduce His cardiovascular risks longer-term.    We will pick up our discussion about the next steps and treatment options after testing.  We will keep them posted as to the test results by phone call and/or MyChart messaging where possible.  We will plan to follow-up in sleep clinic accordingly as well.  I answered all their questions today and the patient and his wife were in agreement.   I encouraged them to call with any interim questions, concerns, problems or updates or email [place] through [place].  Generally speaking, sleep test authorizations may take up to 2 weeks, sometimes less, sometimes longer, the patient is encouraged to get in touch with [place] if they do not hear back from the sleep lab staff directly within the next 2 weeks.  Thank you very much for allowing me to participate in the care of this nice patient. If I can be of any further assistance to you please do not hesitate to call me at  [phone].  Sincerely,   Star Age, MD, PhD

## 2022-07-14 NOTE — Patient Instructions (Addendum)
Thank you for choosing [facility] for your sleep related care! It was nice to meet you today!   Please check if your PCP can see you today for tackycardia (elevated heart rate), you may need blood work, EKG and urine testing done. If PCP cannot see you, please go to Urgent Care.   Here is what we discussed today:    Based on your symptoms and your exam I believe you are at risk for obstructive sleep apnea (aka OSA). We should proceed with a sleep study to determine whether you do or do not have OSA and how severe it is. Even, if you have mild OSA, I may want you to consider treatment with CPAP, as treatment of even borderline or mild sleep apnea can result and improvement of symptoms such as sleep disruption, daytime sleepiness, nighttime bathroom breaks, restless leg symptoms, improvement of headache syndromes, even improved mood disorder.   As explained, an attended sleep study (meaning you get to stay overnight in the sleep lab), lets [place] monitor sleep-related behaviors such as sleep talking and leg movements in sleep, in addition to monitoring for sleep apnea.  A home sleep test is a screening tool for sleep apnea diagnosis only, but unfortunately, does not help with any other sleep-related diagnoses.  Please remember, the long-term risks and ramifications of untreated moderate to severe obstructive sleep apnea may include (but are not limited to): increased risk for cardiovascular disease, including congestive heart failure, stroke, difficult to control hypertension, treatment resistant obesity, arrhythmias, especially irregular heartbeat commonly known as A. Fib. (atrial fibrillation); even type 2 diabetes has been linked to untreated OSA.   Other correlations that untreated obstructive sleep apnea include macular edema which is swelling of the retina in the eyes, droopy eyelid syndrome, and elevated hemoglobin and hematocrit levels (often referred to as polycythemia).  Sleep  apnea can cause disruption of sleep and sleep deprivation in most cases, which, in turn, can cause recurrent headaches, problems with memory, mood, concentration, focus, and vigilance. Most people with untreated sleep apnea report excessive daytime sleepiness, which can affect their ability to drive. Please do not drive or use heavy equipment or machinery, if you feel sleepy! Patients with sleep apnea can also develop difficulty initiating and maintaining sleep (aka insomnia).   Having sleep apnea may increase your risk for other sleep disorders, including involuntary behaviors sleep such as sleep terrors, sleep talking, sleepwalking.    Having sleep apnea can also increase your risk for restless leg syndrome and leg movements at night.   Please note that untreated obstructive sleep apnea may carry additional perioperative morbidity. Patients with significant obstructive sleep apnea (typically, in the moderate to severe degree) should receive, if possible, perioperative PAP (positive airway pressure) therapy and the surgeons and particularly the anesthesiologists should be informed of the diagnosis and the severity of the sleep disordered breathing.   We will call you or email you through [place] with regards to your test results and plan a follow-up in sleep clinic accordingly. Most likely, you will hear from one of our nurses.   Our sleep lab administrative assistant will call you to schedule your sleep study and give you further instructions, regarding the check in process for the sleep study, arrival time, what to bring, when you can expect to leave after the study, etc., and to answer any other logistical questions you may have. If you don't hear back from her by about 2 weeks from now, please feel free to call  her direct line at [phone] or you can call our general clinic number, or email [place] through My Chart.

## 2022-07-17 ENCOUNTER — Telehealth: Payer: Self-pay | Admitting: Neurology

## 2022-07-17 NOTE — Telephone Encounter (Signed)
NPSG- Medicare no auth req   Patient is scheduled at [facility] for 09/22/22 at 9 pm.  Mailed packet to the patient.

## 2022-07-22 DIAGNOSIS — H47293 Other optic atrophy, bilateral: Secondary | ICD-10-CM | POA: Diagnosis not present

## 2022-08-25 DIAGNOSIS — F02A0 Dementia in other diseases classified elsewhere, mild, without behavioral disturbance, psychotic disturbance, mood disturbance, and anxiety: Secondary | ICD-10-CM | POA: Diagnosis not present

## 2022-08-25 DIAGNOSIS — Z008 Encounter for other general examination: Secondary | ICD-10-CM | POA: Diagnosis not present

## 2022-08-25 DIAGNOSIS — Z818 Family history of other mental and behavioral disorders: Secondary | ICD-10-CM | POA: Diagnosis not present

## 2022-08-25 DIAGNOSIS — Z9181 History of falling: Secondary | ICD-10-CM | POA: Diagnosis not present

## 2022-08-25 DIAGNOSIS — Z8249 Family history of ischemic heart disease and other diseases of the circulatory system: Secondary | ICD-10-CM | POA: Diagnosis not present

## 2022-08-25 DIAGNOSIS — F324 Major depressive disorder, single episode, in partial remission: Secondary | ICD-10-CM | POA: Diagnosis not present

## 2022-08-25 DIAGNOSIS — G35 Multiple sclerosis: Secondary | ICD-10-CM | POA: Diagnosis not present

## 2022-08-25 DIAGNOSIS — E785 Hyperlipidemia, unspecified: Secondary | ICD-10-CM | POA: Diagnosis not present

## 2022-08-25 DIAGNOSIS — I10 Essential (primary) hypertension: Secondary | ICD-10-CM | POA: Diagnosis not present

## 2022-08-25 DIAGNOSIS — R269 Unspecified abnormalities of gait and mobility: Secondary | ICD-10-CM | POA: Diagnosis not present

## 2022-08-26 ENCOUNTER — Telehealth: Payer: Self-pay | Admitting: Neurology

## 2022-08-26 NOTE — Telephone Encounter (Signed)
Wife called to update new insurance to [facility]. This has been updated in his chart. Please call if there are any changes to be made to his sleep apt due to the insurance change [id number]

## 2022-08-27 NOTE — Telephone Encounter (Signed)
Noted, [employer] medicare pending for the NPSG.

## 2022-09-01 ENCOUNTER — Telehealth: Payer: Self-pay | Admitting: Pharmacy Technician

## 2022-09-01 NOTE — Telephone Encounter (Signed)
NPSG- [employer] medicare auth: [id number] (exp. 08/27/22 to 02/25/23)   Patient is scheduled at [facility] for 09/22/22 at 9 pm.

## 2022-09-01 NOTE — Telephone Encounter (Signed)
Patient got new insurance.  NPSG- [employer] medicare auth: [id number] (exp. 08/27/22 to 02/25/23)   Patient is scheduled at [facility] for 09/22/22 at 9 pm.

## 2022-09-01 NOTE — Telephone Encounter (Signed)
Patient Advocate Encounter   Received notification that prior authorization for Dalfampridine ER 10MG  er tablets is required.   PA submitted on 09/01/2022 [employer] Medicare Electronic PA Form  Status is pending

## 2022-09-02 ENCOUNTER — Other Ambulatory Visit (HOSPITAL_COMMUNITY): Payer: Self-pay

## 2022-09-02 NOTE — Telephone Encounter (Signed)
Pharmacy Patient Advocate Encounter  Prior Authorization for Dalfampridine ER 10MG  er tablets has been approved by [employer] (ins).    PA # PA Case ID #: PA Case ID #: [id number] Copay is $0 per test claim.

## 2022-09-05 DIAGNOSIS — G35 Multiple sclerosis: Secondary | ICD-10-CM | POA: Diagnosis not present

## 2022-09-05 DIAGNOSIS — I10 Essential (primary) hypertension: Secondary | ICD-10-CM | POA: Diagnosis not present

## 2022-09-05 DIAGNOSIS — R030 Elevated blood-pressure reading, without diagnosis of hypertension: Secondary | ICD-10-CM | POA: Diagnosis not present

## 2022-09-05 DIAGNOSIS — R269 Unspecified abnormalities of gait and mobility: Secondary | ICD-10-CM | POA: Diagnosis not present

## 2022-09-05 DIAGNOSIS — Z6828 Body mass index (BMI) 28.0-28.9, adult: Secondary | ICD-10-CM | POA: Diagnosis not present

## 2022-09-22 ENCOUNTER — Ambulatory Visit (INDEPENDENT_AMBULATORY_CARE_PROVIDER_SITE_OTHER): Payer: Medicare HMO | Admitting: Neurology

## 2022-09-22 DIAGNOSIS — R000 Tachycardia, unspecified: Secondary | ICD-10-CM

## 2022-09-22 DIAGNOSIS — G35D Multiple sclerosis, unspecified: Secondary | ICD-10-CM

## 2022-09-22 DIAGNOSIS — G35 Multiple sclerosis: Secondary | ICD-10-CM

## 2022-09-22 DIAGNOSIS — G4761 Periodic limb movement disorder: Secondary | ICD-10-CM

## 2022-09-22 DIAGNOSIS — G4720 Circadian rhythm sleep disorder, unspecified type: Secondary | ICD-10-CM

## 2022-09-22 DIAGNOSIS — G4719 Other hypersomnia: Secondary | ICD-10-CM

## 2022-09-22 DIAGNOSIS — G4731 Primary central sleep apnea: Secondary | ICD-10-CM

## 2022-09-22 DIAGNOSIS — R9431 Abnormal electrocardiogram [ECG] [EKG]: Secondary | ICD-10-CM

## 2022-09-22 DIAGNOSIS — R0683 Snoring: Secondary | ICD-10-CM | POA: Diagnosis not present

## 2022-09-22 DIAGNOSIS — E663 Overweight: Secondary | ICD-10-CM

## 2022-09-22 DIAGNOSIS — R351 Nocturia: Secondary | ICD-10-CM

## 2022-09-22 DIAGNOSIS — G4733 Obstructive sleep apnea (adult) (pediatric): Secondary | ICD-10-CM

## 2022-09-26 NOTE — Procedures (Signed)
Physician Interpretation:    Referred by: [name], NP/Dr. Levert Feinstein   History and Indication for Testing: 52 year old male with an underlying medical history of multiple sclerosis, depression, and overweight state, who reports snoring and excessive daytime somnolence as well as witnessed apneas.Marland Kitchen  His Epworth sleepiness score is 12 out of 24, fatigue severity score is 37 out of 63.     TITRATION DETAILS (SEE ALSO TABLE AT THE END OF THE REPORT): This patient qualified for a split-night sleep study due to severe sleep disordered breathing.  He had evidence of obstructive, mixed and central events, baseline AHI was 30.8/h, O2 nadir 89%, without any REM sleep achieved during the baseline for the titration portion of the study.  The patient was shown several different interfaces and was subsequently fitted with a medium fullface mask from [employer], [place]. The patient was started on a pressure of 5 cm of water pressure and gradually titrated to a final titration pressure of 13 cm, at which point his AHI improved to 3.5/h with supine non-REM sleep achieved and O2 nadir of 94%.  No REM sleep was achieved during the titration portion of the study.    EEG: Review of the EEG showed no abnormal electrical discharges and symmetrical bihemispheric findings.     EKG: The EKG revealed normal sinus rhythm (NSR). ***Occasional PVCs were noted.   AUDIO/VIDEO REVIEW: The audio and video review did not show any abnormal or unusual behaviors, movements, phonations or vocalizations. The patient took 1 restroom breaks. Snoring was noted, and appeared to be mild and intermittent during the baseline portion of the study, improved during the titration.   POST-STUDY QUESTIONNAIRE: Post study, the patient indicated, that sleep was the same as usual.    IMPRESSION:   Obstructive Sleep Apnea (OSA) Central Sleep Apnea (CSA) PLMD (periodic limb movement disorder [of sleep]) Dysfunctions associated with  sleep stages or arousal from sleep Non-specific abnormal electrocardiogram (EKG)   RECOMMENDATIONS:        I certify that I have reviewed the entire raw data recording prior to the issuance of this report in accordance with the Standards of Accreditation of the American Academy of Sleep Medicine (AASM).   Huston Foley, MD, PhD Medical Director, [facility]) Diplomat, ABPN (Neurology and Sleep)   Technical Report:   ***  Titration Table:  ***

## 2022-09-29 NOTE — Addendum Note (Signed)
Addended by: Huston Foley on: 09/29/2022 06:25 PM   Modules accepted: Orders

## 2022-10-01 ENCOUNTER — Telehealth: Payer: Self-pay | Admitting: *Deleted

## 2022-10-01 NOTE — Telephone Encounter (Signed)
Received fax from [employer] is out of network. We will refer to [facility] instead. I called the pt's wife and LVM (Ok per [employer]) advising her of this. Will send contact info through mychart. I asked her to call [facility] or [place] if she hasn't heard from them in 1 year.   Referral sent to [facility].

## 2022-10-01 NOTE — Telephone Encounter (Signed)
-----   Message from Huston Foley, MD sent at 09/29/2022  6:25 PM EDT ----- Patient referred by [name], NP, seen by me on 07/14/22, patient had a split night sleep study on 09/22/22. Please call and notify patient that the recent sleep study showed severe sleep apnea (OSA). He did quite well with CPAP during the study with significant improvement of the respiratory events. I would like start the patient on home CPAP therapy. Of note, he did not achieve any dream sleep during this study. He had some leg movements in the first part of the night. He had occasional extra beats on EKG, we call these PVCs. These are often benign, and he can get a full EKG through PCP and request a referral to see a cardiologist, especially, if he has any palpitations.  I placed the order for CPAP in the chart.  Please advise patient that we will need a follow up appointment with either myself or one of our nurse practitioners (may see SS) in about 2-3 months post set-up to check for how they are doing on treatment and how well it's going with the machine in general. Most insurance company require a certain compliance percentage to continue to cover/pay for the machine. Please ask patient to schedule this FU appointment, according to the set-up date, which is the day they receive the machine. Please make sure, the patient understands the importance of keeping this window for the FU appointment, as it is often an Barista and not our rule. Failing to adhere to this may result in losing coverage for sleep apnea treatment, at which point some insurances require repeating the whole process. Plus, monitoring compliance data is usually good feedback for the patient as far as how they are doing, how many hours they are on it, how well the mask fits, etc.  Also remind patient, that any PAP machine or mask issues should be first addressed with the DME company, who provided the machine/supplies.  Please ask if patient has a preference  regarding DME company, may depend on the insurance too.  Huston Foley, MD, PhD [facility])

## 2022-10-01 NOTE — Telephone Encounter (Signed)
I called the main # on pt's chart and spoke with wife, [name] (on [place]). I discussed that pt's sleep study showed severe sleep apnea and patient did well on cpap during the sleep study here. Dr Frances Furbish advises to use this at home and she has placed an order.  We also discussed that patient's EKG showed occasional extra beats called PVCs.  These are often benign, and he can get a full EKG through primary care and request referral to see a cardiologist especially if he has any palpitations.  I asked her to give the primary care office a call.  I let her know we would send the results to primary care as well.  Pt's wife mentioned [place] for DME as its close. I advised I would send the order there and if they don't take pt's insurance then we will try [facility] (she prefers [facility] over [facility]). Her questions were answered.  We discussed insurance compliance requirements which includes using the machine at least 4 hours at night and also being seen in our office for an initial follow-up appointment. Pt was scheduled for 12/31/22 at 945 am arrival at least 15-30 minutes early.  I asked her to give [facility] of call if she has not heard from them within 1 week and to also let us know if they cannot get the patient set up on CPAP or else we would need to refer to another DME company.  She verbalized appreciation for the call.  Referral faxed to [facility]. Received a receipt of confirmation. Report sent to referring provider as well drawing attention to EKG.

## 2022-10-02 NOTE — Telephone Encounter (Signed)
[facility] has confirmed receipt of order.

## 2022-10-13 DIAGNOSIS — H468 Other optic neuritis: Secondary | ICD-10-CM | POA: Diagnosis not present

## 2022-10-15 DIAGNOSIS — G4733 Obstructive sleep apnea (adult) (pediatric): Secondary | ICD-10-CM | POA: Diagnosis not present

## 2022-10-15 DIAGNOSIS — R400 Somnolence: Secondary | ICD-10-CM | POA: Diagnosis not present

## 2022-10-20 ENCOUNTER — Encounter: Payer: Self-pay | Admitting: Neurology

## 2022-10-20 ENCOUNTER — Telehealth: Payer: Self-pay | Admitting: Neurology

## 2022-10-20 ENCOUNTER — Ambulatory Visit: Payer: Medicare HMO | Admitting: Neurology

## 2022-10-20 VITALS — BP 132/74 | HR 106 | Ht 71.0 in | Wt 194.0 lb

## 2022-10-20 DIAGNOSIS — R269 Unspecified abnormalities of gait and mobility: Secondary | ICD-10-CM | POA: Diagnosis not present

## 2022-10-20 DIAGNOSIS — G35 Multiple sclerosis: Secondary | ICD-10-CM | POA: Diagnosis not present

## 2022-10-20 DIAGNOSIS — R4189 Other symptoms and signs involving cognitive functions and awareness: Secondary | ICD-10-CM

## 2022-10-20 MED ORDER — DULOXETINE HCL 60 MG PO CPEP: 60.0000 mg | ORAL_CAPSULE | Freq: Every day | ORAL | 3 refills | Status: DC

## 2022-10-20 MED ORDER — QUETIAPINE FUMARATE 25 MG PO TABS: 25.0000 mg | ORAL_TABLET | Freq: Every day | ORAL | 3 refills | Status: DC

## 2022-10-20 NOTE — Progress Notes (Addendum)
ASSESSMENT AND PLAN 52 y.o. year old male    Relapsing remitting Multiple Sclerosis -[name], finished 2nd dose in September 2018 -MRI of the brain in February 2023 continues to show extensive white matter lesions, no new or enhancing lesions, there is age advanced cerebral atrophy -Second opinion at [facility] in October 2023 with Dr. Gaynelle Adu, started on amantadine for fatigue, continue baclofen, Ampyra, no significant improvement with amantadine, and ampyra, will stop Repeat MRI of brain     Worsening memory loss  Gait Abnormality  Referral to home health physical therapy   Depression/PTSD insomnia Stable taking Seroquel 25 mg at bedtime, Cymbalta 60 mg daily    DIAGNOSTIC DATA (LABS, IMAGING, TESTING) - I reviewed patient records, labs, notes, testing and imaging myself where available.  Optometrist Dr. Genevie Cheshire evaluation October 16, 2022 bilateral optic neuritis, 20/40-20/80, severe optic nerve RNFL thinning, OD more than OS, declining bilateral visual acuity, HVF, right homonymous hemianopia,  Of my evaluation by Dr. Genevie Cheshire on January 15, 2023, bilateral optic neuritis, OD more than OS,  severe optic nerve RNFL staining, declining BCVA, was 20/80 OD, 20/70 OS, normal 20/200 OD, 20/80 OS Patient was referred to neuro-ophthalmologist to ensure no other underlying cause  HISTORY OF PRESENT ILLNESS:Mr. Scerbo is a 52 years old right-handed [place] American male, follow-up for relapsing remediating multiple sclerosis   He moved from [place] to [place] in 2013, was previously under the care of [facility] MS specialist Dr. Clabe Seal,   He was diagnosed with relapsing remitting multiple sclerosis in early 2007, presenting with right face, right leg weakness, has mild gait difficulty, diagnosis was confirmed by marked abnormal MRI of brain, and cervical lesions    He was initially treated with Betaseron for 18 months, clinically he was stable, but there was  enhancing MRI lesions on Betaseron, he developed positive interferon antibodies.  Tysabri was started since February 2009, initially every 4 weeks, later stretching to every 6 weeks, JC virus antibody was negative in September 2009 and April 2010, but JC virus antibody began to be positive since September 2010, he tolerates the [place] infusion very well from Feb 2009 till Jan 2013. He has repeat MRI of brain every 6 months.     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  He was treated with [name] from January 24th, 2014 for 6 months, but he continued to have slow progressive worsening gait difficulty, fell multiple times, had few rounds of steroid.  Abnormal visual evoked response test secondary to prolongation of the P100 waveform on the left.     He now complains of constant gait difficulty, bilateral lower extremity spasticity, also complains of urinary incontinence, double vision, he could no longer driving,  also complains of worsening memory trouble, difficulty focusing, could not take care of his young children by himself.      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, index value is 4.15 in June 2014, 2.66 in 11/22/2013   I have discussed treatment options with patient and his wife, he had progressive worsening gait difficulty, memory trouble, vision problem since he was off [place] taking [name] treatment,  His symptoms were relatively stable while taking Quay Burow is 10/998 chance of PML,  He restarted on [name] infusion  since Oct 14th 2014  UPDATE Feb 11th 2016:YY His walking has much improved with Ampyra twice a day,  no significant side effect, he did not had MRI of brain, cervical spine as previously scheduled,     UPDATE October 10 2014:YY He was admitted to hospital March 28-30 2016 for flulike illness, he had much worsening gait  difficulty ever since, now rely on his forefoot cane, he also complains of worsening nocturia, wake up multiple times during night, depressed he could not contribute much to his family, he is still waiting for social disability applications.    Update August the second 2016:YY He continue complains of gait difficulty, right worse than left, rely on his cane, Effexor has been helpful for his depression, complains of worsening memory trouble Anti-[name] antibody was negative. He and his wife concerned about the side effect of lemtrada, wants to stay on Tysabri.   I have reviewed and compared MRI scan in June 2016 at [facility] to February 2016 MRIs, multiple supratentorium lesions, T1 black holes, no contrast enhancement, no significant change compared to previous scans,   UPDATE Feb 21 2015:YY He started to feel increaesd weakness since Nov 1st, right leg worse than left, he took a shower, his leg gave out, fell down, difficult to get out of bathtub, he also has chronic constipations    UPDATE May 09 2015:YY He was treated with one course of Medrol pack, his walking has improved some, but overall has slow progress in gait difficulty, worsening memory trouble, he can no longer babysit his young children at home, mild depression, Effexor has made his depression worse, worsening constipations, failed to improve by over-the-counter medications   UPDATE August 13 2015:Duanne Guess [name] infusion was in July 09 2015, he was evaluated by Dr. Epimenio Foot in March 20 eighth 2017, he is at high risk to develop complication with continued [name] infusion, in addition, he continue have progressive worsening gait difficulty on [name], frequent flareups, we decided to proceed with Claris Pong OCt 16th 2017:YY He had first dose of lemtrada from July 10-14th, 2017 tolerate it well.  I have reviewed hospital admission on November 04 2015 to [facility],   Patient woke up November 03 2015 was  found to be confused, had a fever of 102, he just finished Lematrada infusion on July 14th 2017, upon presentation, blood pressure was 119/54, heart rate of 79, temperature 99.2, patient was documented as low alert, no skin rash, blood culture was sent, he was treated with Levaquin, next   CT of the chest without contrast showed minimum bilateral dependent subsegmental atelectasis, no acute abnormality, MRI of the brain with and without contrast on November 05 2015 showed advanced chronic demyelinating disease throughout the brain, unchanged from January 2017, no new lesion  X-rays was negative for pneumonia,   EKG showed sinus rhythm with heart rate of 97 Laboratory evaluation, glucose 94, creatinine 0.91, and Was mildly elevated 13 point 9, CPK was 282, WBC was 7.8, hemoglobin was 13 point 5, neutrophil was 6 5.6%, influenza a antibody was positive, UA showed negative leukoesterase, negative nitrate,   He was treated with antibiotics, IV solution, his symptoms quickly improved over 4 days, he is now at his baseline, ambulate with a walker, he does not drive, his sister stays with him during the day, he watches TV,  He complains of worsening visual problem, he was seen by optometrist, he could see close vision, but could not read far away.    UPDATE October 02 2016: He denies any significant change, he ambulate with a cane, he continue have significant memory  loss, denies significant depression, mild fatigue, he attend adult day program.  He continues to have mild to moderate constipation, bladder urgency.   He will have repeat Lematrada infusion in July 2018, 2 days following previous infusion on November 03 2016, he was admitted to the hospital for fever, symptoms quickly improved with antibiotic treatment, but UA showed negative leukoesterase, negative nitrate, CT chest showed minimum bilateral dependent actelesis, no acute abnormality  He has monthly lab by Lemetrada at the middle of month.   UPDATE Sept  6th 2018: He tolerates second round of Lemetrada well in 2018, finished infusion last week, no fever noted. He has baseline gait abnormality, no significant worsening, also complains of mild depression anxiety insomnia taking melatonin, Ambien as needed   UPDATE September 30 2017: MRI of the brain 04/03/2017 unchanged with severe burden of demyelinating disease without active demyelinating lesions when compared to MRI in 2017.    He is overall fairly stable, laboratory evaluations was within normal range, CD4 count is trending up,  has stopped the Valtrex treatment, he fell showering April 2019 in with 2 broken ribs on the left side, now recovered, there is no significant change in his gait abnormality  Update June 27, 2019: He is accompanied by his wife at today's clinical visit, continue complains of significant gait abnormality, fell every few weeks, ambulate with a forefoot cane, continue to feel with chronic fatigue, depression, insomnia, is also under the care of [facility] for PTSD, recently stopped the Ambien, started on Seroquel in February 2021, he can sleep better.  He spent most of the day sitting, watching TV, reading books, also has memory loss, rely on his wife to fix his medication box.  UPDATE Mar 07 2020: He was treated with UTI at end of September 2021, he is overall stable, continue have gait abnormality, mental slowing, fatigue, urinary urgency, on polypharmacy treatment, also carries a diagnosis depression, PTSD, Seroquel 25 mg every night has been helpful  UPDATE May 20 2021: He is accompanied by his wife at today's visit, she works from home, patient was noted to have gradual worsening gait abnormality, memory loss, could not carry on much of household obligations, sitting most of the time, do minor exercise regularly,  He continues to have nocturia, improved by oxybutynin,  UPDATE October 20 2022: He is accompanied by his wife at today's clinical visit, was seen by optometrist  recently, concerning visual change, patient himself does not complain, he become very sedentary, passive, sit down most of the time, wife reported worsening cognitive malfunction, slow reaction, lack of motivations, taking quetiapine 25 every night does help him sleep  Tried dalfampridine 10 mg twice a day, amantadine did not help his symptoms much,  PHYSICAL EXAM Vitals:   05/20/21 0956  BP: 133/81  Pulse: 83  Weight: 208 lb 8 oz (94.6 kg)  Height: 5\' 11"  (1.803 m)   Body mass index is 29.08 kg/m.  Gen: NAD, conversant, well nourised, well groomed                     Cardiovascular: Regular rate rhythm, no peripheral edema, warm, nontender. Eyes: Conjunctivae clear without exudates or hemorrhage  NEUROLOGICAL EXAM:  MENTAL STATUS: Speech/cognition:    Speech is slow, slow reaction time, very nice , pleasant    10/20/2022   12:00 PM 05/20/2021   10:00 AM  Montreal Cognitive Assessment   Visuospatial/ Executive (0/5) 1 3  Naming (0/3) 2 3  Attention: Read list  of digits (0/2) 2 2  Attention: Read list of letters (0/1) 1 1  Attention: Serial 7 subtraction starting at 100 (0/3) 0 0  Language: Repeat phrase (0/2) 1 2  Language : Fluency (0/1) 0 0  Abstraction (0/2) 1 2  Delayed Recall (0/5) 0 0  Orientation (0/6) 1 3  Total 9 16     CRANIAL NERVES: CN II: OD 20/200-1, OS 20/70-1 CN III, IV, VI: extraocular movement are normal. No ptosis. CN V: Facial sensation is intact CN VII: Face is symmetric with normal eye closure and smile. CN VIII: Hearing is normal to casual conversation CN IX, X: Phonation is normal. CN XI: Head turning and shoulder shrug are intact  MOTOR: Moderate bilateral lower extremity spasticity, right worse than left, mild right upper extremity weakness, mild right hip flexion weakness   REFLEXES: Reflexes are hyperreflexia, and symmetric at the biceps, triceps, knees, and ankles.  SENSORY: Intact to soft touch on the face, arms,  legs  COORDINATION: Mild dysmetria with finger-nose-finger bilaterally  GAIT/STANCE: Relies on his quad cane, tends to drag the right leg  REVIEW OF SYSTEMS: Out of a complete 14 system review of symptoms, the patient complains only of the following symptoms, and all other reviewed systems are negative.  See HPI  ALLERGIES: No Known Allergies  HOME MEDICATIONS: Outpatient Medications Prior to Visit  Medication Sig Dispense Refill   amantadine (SYMMETREL) 100 MG capsule Take 1 capsule (100 mg total) by mouth daily. 60 capsule 1   baclofen (LIORESAL) 10 MG tablet Take 1 tablet (10 mg total) by mouth 3 (three) times daily. 270 tablet 3   Cholecalciferol (VITAMIN D PO) Take by mouth 2 (two) times daily. Takes 2000units daily     dalfampridine 10 MG TB12 Take 1 tablet (10 mg total) by mouth 2 (two) times daily. 60 tablet 11   DULoxetine (CYMBALTA) 60 MG capsule Take 1 capsule (60 mg total) by mouth daily. 90 capsule 3   lactulose (CHRONULAC) 10 GM/15ML solution TAKE 15 MLS (10 GM TOTAL) BY MOUTH 3 TIMES FOR CONSTIPATION. 4050 mL 3   multivitamin-iron-minerals-folic acid (CENTRUM) chewable tablet Chew 1 tablet by mouth daily.     omega-3 acid ethyl esters (LOVAZA) 1 G capsule Take 1,000 mg by mouth 2 (two) times daily.     prazosin (MINIPRESS) 5 MG capsule Take 5 mg by mouth at bedtime.     QUEtiapine (SEROQUEL) 25 MG tablet Take 1 tablet (25 mg total) by mouth at bedtime. 30 tablet 5   No facility-administered medications prior to visit.    PAST MEDICAL HISTORY: Past Medical History:  Diagnosis Date   Multiple sclerosis (HCC) 04/21/2005   Optic neuritis    Sleep apnea     PAST SURGICAL HISTORY: Past Surgical History:  Procedure Laterality Date   APPENDECTOMY  52 years old   VASECTOMY  08/2013    FAMILY HISTORY: Family History  Problem Relation Age of Onset   High blood pressure Mother    High blood pressure Father     SOCIAL HISTORY: Social History   Socioeconomic  History   Marital status: Married    Spouse name: [name]   Number of children: 2   Years of education: 12   Highest education level: Not on file  Occupational History    Comment: Disabled  Tobacco Use   Smoking status: Never   Smokeless tobacco: Never  Substance and Sexual Activity   Alcohol use: No   Drug use: No  Sexual activity: Not on file  Other Topics Concern   Not on file  Social History Narrative   Patient lives at home with his wife [name]). Patient has two children. Patient is disabled.   Right handed.   Caffeine- one cup daily.   Social Determinants of Health   Financial Resource Strain: Not on file  Food Insecurity: No Food Insecurity (11/28/2021)   Hunger Vital Sign    Worried About Running Out of Food in the Last Year: Never true    Ran Out of Food in the Last Year: Never true  Transportation Needs: Unmet Transportation Needs (11/28/2021)   PRAPARE - Administrator, Civil Service (Medical): No    Lack of Transportation (Non-Medical): Yes  Physical Activity: Not on file  Stress: Not on file  Social Connections: Not on file  Intimate Partner Violence: Not on file    Otila Kluver, DNP  [facility] [address], [address] [place], [place] [address] [phone]

## 2022-10-20 NOTE — Telephone Encounter (Signed)
Pt will be reestablishing care with [facility], they will reach out to get him set up.

## 2022-10-27 ENCOUNTER — Telehealth: Payer: Self-pay | Admitting: Neurology

## 2022-10-27 DIAGNOSIS — R3915 Urgency of urination: Secondary | ICD-10-CM | POA: Diagnosis not present

## 2022-10-27 DIAGNOSIS — Z9181 History of falling: Secondary | ICD-10-CM | POA: Diagnosis not present

## 2022-10-27 DIAGNOSIS — G4700 Insomnia, unspecified: Secondary | ICD-10-CM | POA: Diagnosis not present

## 2022-10-27 DIAGNOSIS — M6289 Other specified disorders of muscle: Secondary | ICD-10-CM | POA: Diagnosis not present

## 2022-10-27 DIAGNOSIS — Z556 Problems related to health literacy: Secondary | ICD-10-CM | POA: Diagnosis not present

## 2022-10-27 DIAGNOSIS — G4730 Sleep apnea, unspecified: Secondary | ICD-10-CM | POA: Diagnosis not present

## 2022-10-27 DIAGNOSIS — K5909 Other constipation: Secondary | ICD-10-CM | POA: Diagnosis not present

## 2022-10-27 DIAGNOSIS — F4310 Post-traumatic stress disorder, unspecified: Secondary | ICD-10-CM | POA: Diagnosis not present

## 2022-10-27 DIAGNOSIS — G35 Multiple sclerosis: Secondary | ICD-10-CM | POA: Diagnosis not present

## 2022-10-27 DIAGNOSIS — R32 Unspecified urinary incontinence: Secondary | ICD-10-CM | POA: Diagnosis not present

## 2022-10-27 DIAGNOSIS — F32A Depression, unspecified: Secondary | ICD-10-CM | POA: Diagnosis not present

## 2022-10-27 DIAGNOSIS — G3184 Mild cognitive impairment, so stated: Secondary | ICD-10-CM | POA: Diagnosis not present

## 2022-10-27 NOTE — Telephone Encounter (Signed)
[employer]are Berkley Harvey: [id number] exp. 10/27/22-04/25/23 sent to [facility] [phone]

## 2022-10-31 DIAGNOSIS — G4730 Sleep apnea, unspecified: Secondary | ICD-10-CM | POA: Diagnosis not present

## 2022-10-31 DIAGNOSIS — F32A Depression, unspecified: Secondary | ICD-10-CM | POA: Diagnosis not present

## 2022-10-31 DIAGNOSIS — G4700 Insomnia, unspecified: Secondary | ICD-10-CM | POA: Diagnosis not present

## 2022-10-31 DIAGNOSIS — R3915 Urgency of urination: Secondary | ICD-10-CM | POA: Diagnosis not present

## 2022-10-31 DIAGNOSIS — K5909 Other constipation: Secondary | ICD-10-CM | POA: Diagnosis not present

## 2022-10-31 DIAGNOSIS — R32 Unspecified urinary incontinence: Secondary | ICD-10-CM | POA: Diagnosis not present

## 2022-10-31 DIAGNOSIS — F4310 Post-traumatic stress disorder, unspecified: Secondary | ICD-10-CM | POA: Diagnosis not present

## 2022-10-31 DIAGNOSIS — Z9181 History of falling: Secondary | ICD-10-CM | POA: Diagnosis not present

## 2022-10-31 DIAGNOSIS — G35 Multiple sclerosis: Secondary | ICD-10-CM | POA: Diagnosis not present

## 2022-10-31 DIAGNOSIS — Z556 Problems related to health literacy: Secondary | ICD-10-CM | POA: Diagnosis not present

## 2022-10-31 DIAGNOSIS — G3184 Mild cognitive impairment, so stated: Secondary | ICD-10-CM | POA: Diagnosis not present

## 2022-11-03 ENCOUNTER — Telehealth: Payer: Self-pay | Admitting: Neurology

## 2022-11-03 DIAGNOSIS — R3915 Urgency of urination: Secondary | ICD-10-CM | POA: Diagnosis not present

## 2022-11-03 DIAGNOSIS — Z9181 History of falling: Secondary | ICD-10-CM | POA: Diagnosis not present

## 2022-11-03 DIAGNOSIS — K5909 Other constipation: Secondary | ICD-10-CM | POA: Diagnosis not present

## 2022-11-03 DIAGNOSIS — F32A Depression, unspecified: Secondary | ICD-10-CM | POA: Diagnosis not present

## 2022-11-03 DIAGNOSIS — G35 Multiple sclerosis: Secondary | ICD-10-CM | POA: Diagnosis not present

## 2022-11-03 DIAGNOSIS — G4700 Insomnia, unspecified: Secondary | ICD-10-CM | POA: Diagnosis not present

## 2022-11-03 DIAGNOSIS — F4310 Post-traumatic stress disorder, unspecified: Secondary | ICD-10-CM | POA: Diagnosis not present

## 2022-11-03 DIAGNOSIS — G4730 Sleep apnea, unspecified: Secondary | ICD-10-CM | POA: Diagnosis not present

## 2022-11-03 DIAGNOSIS — G3184 Mild cognitive impairment, so stated: Secondary | ICD-10-CM | POA: Diagnosis not present

## 2022-11-03 DIAGNOSIS — R32 Unspecified urinary incontinence: Secondary | ICD-10-CM | POA: Diagnosis not present

## 2022-11-03 DIAGNOSIS — Z556 Problems related to health literacy: Secondary | ICD-10-CM | POA: Diagnosis not present

## 2022-11-03 NOTE — Telephone Encounter (Signed)
Daniel physical therapist for [facility] called and LVM wanting to inform provider that the Start of Care has been completed and the services for Home Health have been initiated.

## 2022-11-05 DIAGNOSIS — R3915 Urgency of urination: Secondary | ICD-10-CM | POA: Diagnosis not present

## 2022-11-05 DIAGNOSIS — F32A Depression, unspecified: Secondary | ICD-10-CM | POA: Diagnosis not present

## 2022-11-05 DIAGNOSIS — K5909 Other constipation: Secondary | ICD-10-CM | POA: Diagnosis not present

## 2022-11-05 DIAGNOSIS — G35 Multiple sclerosis: Secondary | ICD-10-CM | POA: Diagnosis not present

## 2022-11-05 DIAGNOSIS — G4700 Insomnia, unspecified: Secondary | ICD-10-CM | POA: Diagnosis not present

## 2022-11-05 DIAGNOSIS — F4310 Post-traumatic stress disorder, unspecified: Secondary | ICD-10-CM | POA: Diagnosis not present

## 2022-11-05 DIAGNOSIS — Z556 Problems related to health literacy: Secondary | ICD-10-CM | POA: Diagnosis not present

## 2022-11-05 DIAGNOSIS — G4730 Sleep apnea, unspecified: Secondary | ICD-10-CM | POA: Diagnosis not present

## 2022-11-05 DIAGNOSIS — Z9181 History of falling: Secondary | ICD-10-CM | POA: Diagnosis not present

## 2022-11-05 DIAGNOSIS — R32 Unspecified urinary incontinence: Secondary | ICD-10-CM | POA: Diagnosis not present

## 2022-11-05 DIAGNOSIS — G3184 Mild cognitive impairment, so stated: Secondary | ICD-10-CM | POA: Diagnosis not present

## 2022-11-11 DIAGNOSIS — Z9181 History of falling: Secondary | ICD-10-CM | POA: Diagnosis not present

## 2022-11-11 DIAGNOSIS — G4700 Insomnia, unspecified: Secondary | ICD-10-CM | POA: Diagnosis not present

## 2022-11-11 DIAGNOSIS — Z556 Problems related to health literacy: Secondary | ICD-10-CM | POA: Diagnosis not present

## 2022-11-11 DIAGNOSIS — F32A Depression, unspecified: Secondary | ICD-10-CM | POA: Diagnosis not present

## 2022-11-11 DIAGNOSIS — G35 Multiple sclerosis: Secondary | ICD-10-CM | POA: Diagnosis not present

## 2022-11-11 DIAGNOSIS — G4730 Sleep apnea, unspecified: Secondary | ICD-10-CM | POA: Diagnosis not present

## 2022-11-11 DIAGNOSIS — K5909 Other constipation: Secondary | ICD-10-CM | POA: Diagnosis not present

## 2022-11-11 DIAGNOSIS — G3184 Mild cognitive impairment, so stated: Secondary | ICD-10-CM | POA: Diagnosis not present

## 2022-11-11 DIAGNOSIS — R3915 Urgency of urination: Secondary | ICD-10-CM | POA: Diagnosis not present

## 2022-11-11 DIAGNOSIS — F4310 Post-traumatic stress disorder, unspecified: Secondary | ICD-10-CM | POA: Diagnosis not present

## 2022-11-11 DIAGNOSIS — R32 Unspecified urinary incontinence: Secondary | ICD-10-CM | POA: Diagnosis not present

## 2022-11-13 DIAGNOSIS — G4700 Insomnia, unspecified: Secondary | ICD-10-CM | POA: Diagnosis not present

## 2022-11-13 DIAGNOSIS — Z9181 History of falling: Secondary | ICD-10-CM | POA: Diagnosis not present

## 2022-11-13 DIAGNOSIS — K5909 Other constipation: Secondary | ICD-10-CM | POA: Diagnosis not present

## 2022-11-13 DIAGNOSIS — G35 Multiple sclerosis: Secondary | ICD-10-CM | POA: Diagnosis not present

## 2022-11-13 DIAGNOSIS — R32 Unspecified urinary incontinence: Secondary | ICD-10-CM | POA: Diagnosis not present

## 2022-11-13 DIAGNOSIS — Z556 Problems related to health literacy: Secondary | ICD-10-CM | POA: Diagnosis not present

## 2022-11-13 DIAGNOSIS — F32A Depression, unspecified: Secondary | ICD-10-CM | POA: Diagnosis not present

## 2022-11-13 DIAGNOSIS — R3915 Urgency of urination: Secondary | ICD-10-CM | POA: Diagnosis not present

## 2022-11-13 DIAGNOSIS — G4730 Sleep apnea, unspecified: Secondary | ICD-10-CM | POA: Diagnosis not present

## 2022-11-13 DIAGNOSIS — F4310 Post-traumatic stress disorder, unspecified: Secondary | ICD-10-CM | POA: Diagnosis not present

## 2022-11-13 DIAGNOSIS — G3184 Mild cognitive impairment, so stated: Secondary | ICD-10-CM | POA: Diagnosis not present

## 2022-11-14 DIAGNOSIS — R400 Somnolence: Secondary | ICD-10-CM | POA: Diagnosis not present

## 2022-11-14 DIAGNOSIS — G4733 Obstructive sleep apnea (adult) (pediatric): Secondary | ICD-10-CM | POA: Diagnosis not present

## 2022-11-18 ENCOUNTER — Ambulatory Visit: Payer: Medicare Other | Admitting: Neurology

## 2022-11-18 DIAGNOSIS — G4730 Sleep apnea, unspecified: Secondary | ICD-10-CM | POA: Diagnosis not present

## 2022-11-18 DIAGNOSIS — R3915 Urgency of urination: Secondary | ICD-10-CM | POA: Diagnosis not present

## 2022-11-18 DIAGNOSIS — F4310 Post-traumatic stress disorder, unspecified: Secondary | ICD-10-CM | POA: Diagnosis not present

## 2022-11-18 DIAGNOSIS — R32 Unspecified urinary incontinence: Secondary | ICD-10-CM | POA: Diagnosis not present

## 2022-11-18 DIAGNOSIS — G3184 Mild cognitive impairment, so stated: Secondary | ICD-10-CM | POA: Diagnosis not present

## 2022-11-18 DIAGNOSIS — G4700 Insomnia, unspecified: Secondary | ICD-10-CM | POA: Diagnosis not present

## 2022-11-18 DIAGNOSIS — Z9181 History of falling: Secondary | ICD-10-CM | POA: Diagnosis not present

## 2022-11-18 DIAGNOSIS — K5909 Other constipation: Secondary | ICD-10-CM | POA: Diagnosis not present

## 2022-11-18 DIAGNOSIS — F32A Depression, unspecified: Secondary | ICD-10-CM | POA: Diagnosis not present

## 2022-11-18 DIAGNOSIS — Z556 Problems related to health literacy: Secondary | ICD-10-CM | POA: Diagnosis not present

## 2022-11-18 DIAGNOSIS — G35 Multiple sclerosis: Secondary | ICD-10-CM | POA: Diagnosis not present

## 2022-11-19 DIAGNOSIS — G3184 Mild cognitive impairment, so stated: Secondary | ICD-10-CM | POA: Diagnosis not present

## 2022-11-19 DIAGNOSIS — G35 Multiple sclerosis: Secondary | ICD-10-CM | POA: Diagnosis not present

## 2022-11-19 DIAGNOSIS — Z556 Problems related to health literacy: Secondary | ICD-10-CM | POA: Diagnosis not present

## 2022-11-19 DIAGNOSIS — G4730 Sleep apnea, unspecified: Secondary | ICD-10-CM | POA: Diagnosis not present

## 2022-11-19 DIAGNOSIS — R3915 Urgency of urination: Secondary | ICD-10-CM | POA: Diagnosis not present

## 2022-11-19 DIAGNOSIS — F4310 Post-traumatic stress disorder, unspecified: Secondary | ICD-10-CM | POA: Diagnosis not present

## 2022-11-19 DIAGNOSIS — K5909 Other constipation: Secondary | ICD-10-CM | POA: Diagnosis not present

## 2022-11-19 DIAGNOSIS — R32 Unspecified urinary incontinence: Secondary | ICD-10-CM | POA: Diagnosis not present

## 2022-11-19 DIAGNOSIS — G4700 Insomnia, unspecified: Secondary | ICD-10-CM | POA: Diagnosis not present

## 2022-11-19 DIAGNOSIS — Z9181 History of falling: Secondary | ICD-10-CM | POA: Diagnosis not present

## 2022-11-19 DIAGNOSIS — F32A Depression, unspecified: Secondary | ICD-10-CM | POA: Diagnosis not present

## 2022-11-21 ENCOUNTER — Ambulatory Visit (HOSPITAL_COMMUNITY)
Admission: RE | Admit: 2022-11-21 | Discharge: 2022-11-21 | Disposition: A | Discharge status: Home / Self Care | Payer: Medicare HMO | Source: Ambulatory Visit | Attending: Neurology | Admitting: Neurology

## 2022-11-21 DIAGNOSIS — R269 Unspecified abnormalities of gait and mobility: Secondary | ICD-10-CM | POA: Insufficient documentation

## 2022-11-21 DIAGNOSIS — R9082 White matter disease, unspecified: Secondary | ICD-10-CM | POA: Diagnosis not present

## 2022-11-21 DIAGNOSIS — G35 Multiple sclerosis: Secondary | ICD-10-CM | POA: Diagnosis not present

## 2022-11-21 DIAGNOSIS — G319 Degenerative disease of nervous system, unspecified: Secondary | ICD-10-CM | POA: Diagnosis not present

## 2022-11-21 MED ORDER — GADOBUTROL 1 MMOL/ML IV SOLN
10.0000 mL | Freq: Once | INTRAVENOUS | Status: AC | PRN
  Administered 2022-11-21: 10 mL via INTRAVENOUS

## 2022-11-25 ENCOUNTER — Telehealth: Payer: Self-pay | Admitting: Neurology

## 2022-11-25 DIAGNOSIS — G4730 Sleep apnea, unspecified: Secondary | ICD-10-CM | POA: Diagnosis not present

## 2022-11-25 DIAGNOSIS — F4310 Post-traumatic stress disorder, unspecified: Secondary | ICD-10-CM | POA: Diagnosis not present

## 2022-11-25 DIAGNOSIS — G3184 Mild cognitive impairment, so stated: Secondary | ICD-10-CM | POA: Diagnosis not present

## 2022-11-25 DIAGNOSIS — K5909 Other constipation: Secondary | ICD-10-CM | POA: Diagnosis not present

## 2022-11-25 DIAGNOSIS — Z556 Problems related to health literacy: Secondary | ICD-10-CM | POA: Diagnosis not present

## 2022-11-25 DIAGNOSIS — R3915 Urgency of urination: Secondary | ICD-10-CM | POA: Diagnosis not present

## 2022-11-25 DIAGNOSIS — F32A Depression, unspecified: Secondary | ICD-10-CM | POA: Diagnosis not present

## 2022-11-25 DIAGNOSIS — G4700 Insomnia, unspecified: Secondary | ICD-10-CM | POA: Diagnosis not present

## 2022-11-25 DIAGNOSIS — Z9181 History of falling: Secondary | ICD-10-CM | POA: Diagnosis not present

## 2022-11-25 DIAGNOSIS — R32 Unspecified urinary incontinence: Secondary | ICD-10-CM | POA: Diagnosis not present

## 2022-11-25 DIAGNOSIS — G35 Multiple sclerosis: Secondary | ICD-10-CM | POA: Diagnosis not present

## 2022-11-25 NOTE — Telephone Encounter (Signed)
[facility] Loraine Leriche) want to make the neurologist aware. Pt had 2 falls was walking when tired and got unsafe. Wife was able to assist pt up, no injuries. Can call back if have any questions.

## 2022-11-25 NOTE — Telephone Encounter (Signed)
Returned call mark with [facility]:  1 fall Saturday and 1 fall Sunday notified the pt of it. Denied pain, dizziness, no sins of orthostatic htn.

## 2022-11-27 ENCOUNTER — Telehealth: Payer: Self-pay | Admitting: *Deleted

## 2022-11-27 DIAGNOSIS — G4731 Primary central sleep apnea: Secondary | ICD-10-CM

## 2022-11-27 DIAGNOSIS — G4733 Obstructive sleep apnea (adult) (pediatric): Secondary | ICD-10-CM

## 2022-11-27 NOTE — Telephone Encounter (Signed)
Received a DL from [facility]. The AHI says 16.8 from 10/14/22 - 11/12/22. I checked the resmed portal to see the latest download which is below. Will see if Dr Frances Furbish has recommendation for pressure change or we should wait until his follow-up to see how he is doing. Follow-up is scheduled for 12/31/22.

## 2022-11-27 NOTE — Telephone Encounter (Signed)
DL suggests primarily residual central events. His sleep study had shown severe sleep disordered breathing including obstructive and mixed and central events.  I recommend reducing his CPAP pressure to 12 cm for now and see how it goes until his first follow-up appointment.

## 2022-11-27 NOTE — Addendum Note (Signed)
Addended by: Huston Foley on: 11/27/2022 12:33 PM   Modules accepted: Orders

## 2022-11-27 NOTE — Telephone Encounter (Signed)
Order for pressure change faxed to [facility].

## 2022-12-01 DIAGNOSIS — R32 Unspecified urinary incontinence: Secondary | ICD-10-CM | POA: Diagnosis not present

## 2022-12-01 DIAGNOSIS — Z556 Problems related to health literacy: Secondary | ICD-10-CM | POA: Diagnosis not present

## 2022-12-01 DIAGNOSIS — G4700 Insomnia, unspecified: Secondary | ICD-10-CM | POA: Diagnosis not present

## 2022-12-01 DIAGNOSIS — Z9181 History of falling: Secondary | ICD-10-CM | POA: Diagnosis not present

## 2022-12-01 DIAGNOSIS — G3184 Mild cognitive impairment, so stated: Secondary | ICD-10-CM | POA: Diagnosis not present

## 2022-12-01 DIAGNOSIS — G35 Multiple sclerosis: Secondary | ICD-10-CM | POA: Diagnosis not present

## 2022-12-01 DIAGNOSIS — F32A Depression, unspecified: Secondary | ICD-10-CM | POA: Diagnosis not present

## 2022-12-01 DIAGNOSIS — R3915 Urgency of urination: Secondary | ICD-10-CM | POA: Diagnosis not present

## 2022-12-01 DIAGNOSIS — K5909 Other constipation: Secondary | ICD-10-CM | POA: Diagnosis not present

## 2022-12-01 DIAGNOSIS — F4310 Post-traumatic stress disorder, unspecified: Secondary | ICD-10-CM | POA: Diagnosis not present

## 2022-12-01 DIAGNOSIS — G4730 Sleep apnea, unspecified: Secondary | ICD-10-CM | POA: Diagnosis not present

## 2022-12-08 DIAGNOSIS — Z556 Problems related to health literacy: Secondary | ICD-10-CM | POA: Diagnosis not present

## 2022-12-08 DIAGNOSIS — Z9181 History of falling: Secondary | ICD-10-CM | POA: Diagnosis not present

## 2022-12-08 DIAGNOSIS — R3915 Urgency of urination: Secondary | ICD-10-CM | POA: Diagnosis not present

## 2022-12-08 DIAGNOSIS — F4310 Post-traumatic stress disorder, unspecified: Secondary | ICD-10-CM | POA: Diagnosis not present

## 2022-12-08 DIAGNOSIS — G4700 Insomnia, unspecified: Secondary | ICD-10-CM | POA: Diagnosis not present

## 2022-12-08 DIAGNOSIS — F32A Depression, unspecified: Secondary | ICD-10-CM | POA: Diagnosis not present

## 2022-12-08 DIAGNOSIS — K5909 Other constipation: Secondary | ICD-10-CM | POA: Diagnosis not present

## 2022-12-08 DIAGNOSIS — R32 Unspecified urinary incontinence: Secondary | ICD-10-CM | POA: Diagnosis not present

## 2022-12-08 DIAGNOSIS — G3184 Mild cognitive impairment, so stated: Secondary | ICD-10-CM | POA: Diagnosis not present

## 2022-12-08 DIAGNOSIS — G35 Multiple sclerosis: Secondary | ICD-10-CM | POA: Diagnosis not present

## 2022-12-08 DIAGNOSIS — G4730 Sleep apnea, unspecified: Secondary | ICD-10-CM | POA: Diagnosis not present

## 2022-12-15 DIAGNOSIS — R400 Somnolence: Secondary | ICD-10-CM | POA: Diagnosis not present

## 2022-12-15 DIAGNOSIS — G4733 Obstructive sleep apnea (adult) (pediatric): Secondary | ICD-10-CM | POA: Diagnosis not present

## 2022-12-16 DIAGNOSIS — I10 Essential (primary) hypertension: Secondary | ICD-10-CM | POA: Diagnosis not present

## 2022-12-16 DIAGNOSIS — G35 Multiple sclerosis: Secondary | ICD-10-CM | POA: Diagnosis not present

## 2022-12-16 DIAGNOSIS — R269 Unspecified abnormalities of gait and mobility: Secondary | ICD-10-CM | POA: Diagnosis not present

## 2022-12-16 DIAGNOSIS — R32 Unspecified urinary incontinence: Secondary | ICD-10-CM | POA: Diagnosis not present

## 2022-12-16 DIAGNOSIS — Z6827 Body mass index (BMI) 27.0-27.9, adult: Secondary | ICD-10-CM | POA: Diagnosis not present

## 2022-12-16 DIAGNOSIS — K5909 Other constipation: Secondary | ICD-10-CM | POA: Diagnosis not present

## 2022-12-16 DIAGNOSIS — Z9181 History of falling: Secondary | ICD-10-CM | POA: Diagnosis not present

## 2022-12-16 DIAGNOSIS — G4730 Sleep apnea, unspecified: Secondary | ICD-10-CM | POA: Diagnosis not present

## 2022-12-16 DIAGNOSIS — R3915 Urgency of urination: Secondary | ICD-10-CM | POA: Diagnosis not present

## 2022-12-16 DIAGNOSIS — F4310 Post-traumatic stress disorder, unspecified: Secondary | ICD-10-CM | POA: Diagnosis not present

## 2022-12-16 DIAGNOSIS — G3184 Mild cognitive impairment, so stated: Secondary | ICD-10-CM | POA: Diagnosis not present

## 2022-12-16 DIAGNOSIS — G4733 Obstructive sleep apnea (adult) (pediatric): Secondary | ICD-10-CM | POA: Diagnosis not present

## 2022-12-16 DIAGNOSIS — G4700 Insomnia, unspecified: Secondary | ICD-10-CM | POA: Diagnosis not present

## 2022-12-16 DIAGNOSIS — I739 Peripheral vascular disease, unspecified: Secondary | ICD-10-CM | POA: Diagnosis not present

## 2022-12-16 DIAGNOSIS — F32A Depression, unspecified: Secondary | ICD-10-CM | POA: Diagnosis not present

## 2022-12-16 DIAGNOSIS — Z556 Problems related to health literacy: Secondary | ICD-10-CM | POA: Diagnosis not present

## 2022-12-31 ENCOUNTER — Telehealth: Payer: Self-pay | Admitting: *Deleted

## 2022-12-31 ENCOUNTER — Ambulatory Visit: Payer: Medicare HMO | Admitting: Neurology

## 2022-12-31 ENCOUNTER — Encounter: Payer: Self-pay | Admitting: Neurology

## 2022-12-31 VITALS — BP 132/84 | HR 118 | Ht 71.0 in | Wt 194.0 lb

## 2022-12-31 DIAGNOSIS — G4733 Obstructive sleep apnea (adult) (pediatric): Secondary | ICD-10-CM

## 2022-12-31 DIAGNOSIS — G4731 Primary central sleep apnea: Secondary | ICD-10-CM

## 2022-12-31 NOTE — Progress Notes (Signed)
Subjective:    Patient ID: Lawrence Lamb is a 52 y.o. male.  HPI    Interim history:   Lawrence Lamb is a 52 year old male with an underlying medical history of multiple sclerosis, depression, and overweight state, who presents for follow-up consultation of his obstructive sleep apnea after interim testing and starting home CPAP therapy.  The patient is accompanied by his wife today.  I first met him at the request of Lawrence Ege, NP and Dr. Terrace Lamb on 07/14/2022, at which time he reported snoring, witnessed apneas and daytime somnolence.  He was advised to proceed with a sleep study.  He had a split-night sleep study through our sleep lab on 09/22/2022 which showed a baseline AHI of 30.8/h, O2 nadir 89%, with absence of REM sleep during the baseline portion of the study.  He had a good response to CPAP therapy with a final titration pressure of 13 cm, at which point his AHI was 3.5/h, with supine non-REM sleep achieved and O2 nadir of 94%.  He had evidence of central apneas during the baseline portion of the study but no significant central apneas on CPAP therapy.  He was advised to proceed with home CPAP therapy at a pressure of 13 cm.  I subsequently reduced the pressure to 12 cm in early August 2024 due to residual central events.  Today, 12/31/2022: I reviewed his CPAP compliance data from 11/30/2022 through 12/29/2022, which is a total of 30 days, during which time he used his machine every night with percent use days greater than 4 hours at 90%, indicating excellent compliance with an average usage of 8 hours and 45 minutes, residual AHI elevated at 16.3/h with primarily central events, central index of 8.8/h, pressure of 12 cm with EPR of 3, leak on the higher side with the 95th percentile at 25.7 L/min.  He reports doing okay, feeling a little better.  His wife provides more details, reports that he is actually sleeping better.  His Epworth sleepiness score is 3 out of 24, previously 12.  She does report  that he has trouble tolerating treatment at times and has a tendency to not put the mask back on after he comes back from the bathroom break in the early morning hours.  She has to encourage him to put the mask on and also has to help him to put the mask on.  She inquired about inspire and we talked about this as well.  He and his wife are advised that inspire is a surgical treatment that is not superior to CPAP therapy and it does not treat central apneas.  He is motivated to continue with treatment.  We talked about his sleep study results today and reviewed his compliance data in detail.  He recently saw Dr. Terrace Lamb in follow-up for his MS on 10/20/2022.  I reviewed the office visit note.   The patient's allergies, current medications, family history, past medical history, past social history, past surgical history and problem list were reviewed and updated as appropriate.   Previously:  07/14/2022: (He) reports snoring and excessive daytime somnolence as well as witnessed apneas.  His Epworth sleepiness score is 12 out of 24, fatigue severity score is 37 out of 63.  I reviewed your office note from 05/20/2022.  He does not go to bed at a scheduled time, his wife has to encourage him to go to bed before 11.  He may sleep till 10 or noon.  He has nocturia about 2-3 times per  night, denies recurrent nocturnal or morning headaches.  His mom snores, wife reports that his sister was supposed to get checked for sleep apnea as well.  His snoring is loud and she has noticed pauses in his breathing while he is asleep.  He drinks caffeine in the form of coffee, 2 cups in the morning, typically no soda.  He is tachycardic today and did not have any excessive caffeine, blood pressure is on the lower side, he has no lightheadedness or other symptoms.  His wife reports that he has had a UTI before and never had symptoms from it.  She is willing to take him to PCP or urgent care for checkup.  Weight has been more or less  stable.  His Past Medical History Is Significant For: Past Medical History:  Diagnosis Date   Multiple sclerosis (HCC) 04/21/2005   Optic neuritis    Sleep apnea     His Past Surgical History Is Significant For: Past Surgical History:  Procedure Laterality Date   APPENDECTOMY  52 years old   VASECTOMY  08/2013    His Family History Is Significant For: Family History  Problem Relation Age of Onset   High blood pressure Mother    Sleep apnea Mother    High blood pressure Father     His Social History Is Significant For: Social History   Socioeconomic History   Marital status: Married    Spouse name: Lawrence Lamb   Number of children: 2   Years of education: 12   Highest education level: Not on file  Occupational History    Comment: Disabled  Tobacco Use   Smoking status: Never   Smokeless tobacco: Never  Substance and Sexual Activity   Alcohol use: No   Drug use: No   Sexual activity: Not on file  Other Topics Concern   Not on file  Social History Narrative   Patient lives at home with his wife Lawrence Lamb). Patient has two children. Patient is disabled.   Right handed.   Caffeine- one cup daily.   Social Determinants of Health   Financial Resource Strain: Not on file  Food Insecurity: No Food Insecurity (11/28/2021)   Hunger Vital Sign    Worried About Running Out of Food in the Last Year: Never true    Ran Out of Food in the Last Year: Never true  Transportation Needs: Unmet Transportation Needs (11/28/2021)   PRAPARE - Administrator, Civil Service (Medical): No    Lack of Transportation (Non-Medical): Yes  Physical Activity: Not on file  Stress: Not on file  Social Connections: Not on file    His Allergies Are:  No Known Allergies:   His Current Medications Are:  Outpatient Encounter Medications as of 12/31/2022  Medication Sig   baclofen (LIORESAL) 10 MG tablet Take 1 tablet (10 mg total) by mouth 3 (three) times daily.   Cholecalciferol (VITAMIN D  PO) Take by mouth 2 (two) times daily. Takes 2000units daily   dalfampridine 10 MG TB12 Take 1 tablet (10 mg total) by mouth 2 (two) times daily.   DULoxetine (CYMBALTA) 60 MG capsule Take 1 capsule (60 mg total) by mouth daily.   lactulose (CHRONULAC) 10 GM/15ML solution TAKE 15 MLS (10 GM TOTAL) BY MOUTH 3 TIMES FOR CONSTIPATION.   multivitamin-iron-minerals-folic acid (CENTRUM) chewable tablet Chew 1 tablet by mouth daily.   omega-3 acid ethyl esters (LOVAZA) 1 G capsule Take 1,000 mg by mouth 2 (two) times daily.   prazosin (  MINIPRESS) 5 MG capsule Take 5 mg by mouth at bedtime.   QUEtiapine (SEROQUEL) 25 MG tablet Take 1 tablet (25 mg total) by mouth at bedtime.   No facility-administered encounter medications on file as of 12/31/2022.  :  Review of Systems:  Out of a complete 14 point review of systems, all are reviewed and negative with the exception of these symptoms as listed below:  Review of Systems  Neurological:        Pt here for CPAP f/u Pt states no questions or concerns for today's visit     Objective:  Neurological Exam  Physical Exam Physical Examination:   Vitals:   12/31/22 0936  BP: 132/84  Pulse: (!) 118    General Examination: The patient is a very pleasant 52 y.o. male in no acute distress. He appears well-developed and well-nourished and well groomed.   HEENT: Normocephalic, atraumatic, pupils are equal, round and reactive to light, corrective eyeglasses in place. Face is symmetric with normal facial animation, puffy eyelids noted, mild ptosis bilaterally. Speech is scant, mildly hypophonic and mildly dysarthric.  There is no lip, neck/head, jaw or voice tremor. Neck with FROM. There are no carotid bruits on auscultation. Oropharynx exam reveals: moderate mouth dryness, adequate dental hygiene and moderate airway crowding. Tongue protrudes centrally and palate elevates symmetrically.     Chest: Clear to auscultation without wheezing, rhonchi or crackles  noted.   Heart: S1+S2+0, regular and normal without murmurs, rubs or gallops noted.    Abdomen: Soft, non-tender and non-distended.   Extremities: There is no obvious swelling in the distal lower extremities bilaterally.    Skin: Warm and dry without trophic changes noted.    Musculoskeletal: exam reveals no obvious joint deformities.    Neurologically:  Mental status: The patient is awake, alert and oriented but does not provide details to his history.  Speech is scant.  Slowness in responding noted. Mood is normal and affect is normal.  Cranial nerves II - XII are as described above under HEENT exam.  Motor exam: Normal bulk, moving all 4 extremities.  No obvious resting or action tremor.      Cerebellar testing: Mild dysmetria noted.  Sensory exam: intact to light touch in the upper and lower extremities.  Gait, station and balance: He stands slowly, he uses his electric scooter.   Assessment and Plan:  In summary, Lawrence Lamb is a very pleasant 52 year old male with an underlying medical history of multiple sclerosis, cognitive impairment, depression, and overweight state, who presents for follow-up consultation of his obstructive sleep apnea after interim testing and starting home CPAP therapy. He had a split-night sleep study through our sleep lab on 09/22/2022 which showed a baseline AHI of 30.8/h, O2 nadir 89%, with absence of REM sleep during the baseline portion of the study.  He had evidence of central apneas as well.  He had a good response to CPAP therapy with a final titration pressure of 13 cm, at which point his AHI was 3.5/h, with supine non-REM sleep achieved and O2 nadir of 94%.  He was started on CPAP of 13 cm, his set up date was 10/15/2022.  He had evidence of residual central apneas and we reduced his pressure to 12 cm in early August 2024.  He has excellent compliance and good obstructive apnea control but still has evidence of residual central apneas.  We talked about  his sleep study results in detail and reviewed his compliance data.  He is taking some  centrally acting medications.  I would like to see if we can reduce his CPAP pressure again by 1 notch to 11 cm at this time, I would like to review his download in 6 to 8 weeks.  His wife is reminded to give [place] a call or remind [place] via MyChart message.  He is commended for his treatment adherence, it certainly is a team effort as his wife plays a big role in his compliance.  He is advised to follow-up routinely in this clinic to see Lawrence Lamb for sleep apnea follow-up in about a year.  If he has ongoing central apneas on the next download, we may consider bringing him in for a formal titration study.   I answered all their questions today and the patient and his wife were in agreement. I spent 40 minutes in total face-to-face time and in reviewing records during pre-charting, more than 50% of which was spent in counseling and coordination of care, reviewing test results, reviewing medications and treatment regimen and/or in discussing or reviewing the diagnosis of OSA, CSA, the prognosis and treatment options. Pertinent laboratory and imaging test results that were available during this visit with the patient were reviewed by me and considered in my medical decision making (see chart for details).

## 2022-12-31 NOTE — Patient Instructions (Addendum)
It was nice to see you again. Great job with the CPAP!  You are very compliant with CPAP. You still have residual central apneas. We can try to reduce the pressure by another notch to 11 cm for the next couple of months and review another report from your machine in 6-8 weeks.   For now, let's plan a follow up in one year with [name], NP.   Please continue using your CPAP regularly. While your insurance requires that you use CPAP at least 4 hours each night on 70% of the nights, I recommend, that you not skip any nights and use it throughout the night if you can. Getting used to CPAP and staying with the treatment long term does take time and patience and discipline. Untreated obstructive sleep apnea when it is moderate to severe can have an adverse impact on cardiovascular health and raise her risk for heart disease, arrhythmias, hypertension, congestive heart failure, stroke and diabetes. Untreated obstructive sleep apnea causes sleep disruption, nonrestorative sleep, and sleep deprivation. This can have an impact on your day to day functioning and cause daytime sleepiness and impairment of cognitive function, memory loss, mood disturbance, and problems focussing. Using CPAP regularly can improve these symptoms.  If you still have significant central apneas on the next report/download, we may consider another sleep study for CPAP optimization in the near future.

## 2022-12-31 NOTE — Telephone Encounter (Signed)
Per v.o. [facility], CPAP pressure in resmed portal has been changed to 11 cm H20 from 12. Order sent to [facility] for their records.

## 2023-01-08 ENCOUNTER — Telehealth: Payer: Self-pay | Admitting: Neurology

## 2023-01-08 MED ORDER — QUETIAPINE FUMARATE 25 MG PO TABS: 25.0000 mg | ORAL_TABLET | Freq: Every day | ORAL | 1 refills | Status: DC

## 2023-01-08 MED ORDER — BACLOFEN 10 MG PO TABS: 10.0000 mg | ORAL_TABLET | Freq: Three times a day (TID) | ORAL | 1 refills | Status: DC

## 2023-01-08 MED ORDER — DULOXETINE HCL 60 MG PO CPEP: 60.0000 mg | ORAL_CAPSULE | Freq: Every day | ORAL | 1 refills | Status: DC

## 2023-01-08 NOTE — Telephone Encounter (Signed)
Refills have been sent. Pt's next appt is 07/28/23.

## 2023-01-08 NOTE — Telephone Encounter (Signed)
Due to change in insurance, wife is asking that the baclofen (LIORESAL) 10 MG tablet, DULoxetine (CYMBALTA) 60 MG capsule,QUEtiapine (SEROQUEL) 25 MG tablet  all be called into :[facility] ph#[phone] fax#[phone]

## 2023-01-15 DIAGNOSIS — R400 Somnolence: Secondary | ICD-10-CM | POA: Diagnosis not present

## 2023-01-15 DIAGNOSIS — G4733 Obstructive sleep apnea (adult) (pediatric): Secondary | ICD-10-CM | POA: Diagnosis not present

## 2023-01-15 DIAGNOSIS — H469 Unspecified optic neuritis: Secondary | ICD-10-CM | POA: Diagnosis not present

## 2023-02-14 DIAGNOSIS — R400 Somnolence: Secondary | ICD-10-CM | POA: Diagnosis not present

## 2023-02-14 DIAGNOSIS — G4733 Obstructive sleep apnea (adult) (pediatric): Secondary | ICD-10-CM | POA: Diagnosis not present

## 2023-03-10 DIAGNOSIS — Z0001 Encounter for general adult medical examination with abnormal findings: Secondary | ICD-10-CM | POA: Diagnosis not present

## 2023-03-10 DIAGNOSIS — G4733 Obstructive sleep apnea (adult) (pediatric): Secondary | ICD-10-CM | POA: Diagnosis not present

## 2023-03-10 DIAGNOSIS — Z1389 Encounter for screening for other disorder: Secondary | ICD-10-CM | POA: Diagnosis not present

## 2023-03-10 DIAGNOSIS — R269 Unspecified abnormalities of gait and mobility: Secondary | ICD-10-CM | POA: Diagnosis not present

## 2023-03-10 DIAGNOSIS — Z1331 Encounter for screening for depression: Secondary | ICD-10-CM | POA: Diagnosis not present

## 2023-03-10 DIAGNOSIS — G35 Multiple sclerosis: Secondary | ICD-10-CM | POA: Diagnosis not present

## 2023-03-10 DIAGNOSIS — I10 Essential (primary) hypertension: Secondary | ICD-10-CM | POA: Diagnosis not present

## 2023-03-10 DIAGNOSIS — Z23 Encounter for immunization: Secondary | ICD-10-CM | POA: Diagnosis not present

## 2023-03-17 DIAGNOSIS — G4733 Obstructive sleep apnea (adult) (pediatric): Secondary | ICD-10-CM | POA: Diagnosis not present

## 2023-03-17 DIAGNOSIS — R400 Somnolence: Secondary | ICD-10-CM | POA: Diagnosis not present

## 2023-03-23 ENCOUNTER — Other Ambulatory Visit: Payer: Self-pay | Admitting: Neurology

## 2023-03-23 DIAGNOSIS — G35 Multiple sclerosis: Secondary | ICD-10-CM

## 2023-03-23 DIAGNOSIS — R269 Unspecified abnormalities of gait and mobility: Secondary | ICD-10-CM

## 2023-03-25 DIAGNOSIS — Z0100 Encounter for examination of eyes and vision without abnormal findings: Secondary | ICD-10-CM | POA: Diagnosis not present

## 2023-04-16 DIAGNOSIS — G4733 Obstructive sleep apnea (adult) (pediatric): Secondary | ICD-10-CM | POA: Diagnosis not present

## 2023-04-16 DIAGNOSIS — R400 Somnolence: Secondary | ICD-10-CM | POA: Diagnosis not present

## 2023-05-05 DIAGNOSIS — G4733 Obstructive sleep apnea (adult) (pediatric): Secondary | ICD-10-CM | POA: Diagnosis not present

## 2023-05-05 DIAGNOSIS — R400 Somnolence: Secondary | ICD-10-CM | POA: Diagnosis not present

## 2023-05-17 DIAGNOSIS — G4733 Obstructive sleep apnea (adult) (pediatric): Secondary | ICD-10-CM | POA: Diagnosis not present

## 2023-05-17 DIAGNOSIS — R400 Somnolence: Secondary | ICD-10-CM | POA: Diagnosis not present

## 2023-05-28 ENCOUNTER — Telehealth: Payer: Self-pay | Admitting: Neurology

## 2023-05-28 NOTE — Telephone Encounter (Signed)
 Call to patients wife, she states he has had increased weakness, confusion, and falls for several weeks and has hot hit head but refuses to go to ER. They have sen his PCP and had switched cholesterol medication from atorvastatin to Crestor thinking that could be the cause but has been off all cholesterol medications for 3 weeks and still  is weak and falling. Advised I would send to Dr.Yan to review.

## 2023-05-28 NOTE — Telephone Encounter (Signed)
 I was able to talk with his wife, over the past 2 to 3 weeks, he has increased confusion, falling episode, denies fever, apparent infection  Reviewed prolonged MS history, multiple brain MRIs in the past that showed chronic lesions, extensive, but no active process  Most recent MRI of the brain without contrast was in August 2024, stable advanced atrophy, diffuse confluent MS disease  I have advised patient and his wife continue follow-up with local primary care physician, rule out active infection, thyroid  malfunction,

## 2023-05-28 NOTE — Telephone Encounter (Signed)
 Pt's wife, Lawrence Lamb pt is falling 3 to  4 times a day, confusion, mental status is not good. Seen PCP, she discontinued medications for his cholestrol, also need to have a MRI and may be put on steroid regiment. Would like a call back.

## 2023-05-28 NOTE — Telephone Encounter (Signed)
 noted

## 2023-05-29 DIAGNOSIS — R300 Dysuria: Secondary | ICD-10-CM | POA: Diagnosis not present

## 2023-05-29 DIAGNOSIS — D72829 Elevated white blood cell count, unspecified: Secondary | ICD-10-CM | POA: Diagnosis not present

## 2023-05-29 DIAGNOSIS — R350 Frequency of micturition: Secondary | ICD-10-CM | POA: Diagnosis not present

## 2023-06-03 ENCOUNTER — Telehealth: Payer: Self-pay | Admitting: Neurology

## 2023-06-03 DIAGNOSIS — I10 Essential (primary) hypertension: Secondary | ICD-10-CM | POA: Diagnosis not present

## 2023-06-03 NOTE — Telephone Encounter (Signed)
Labs sent to me from PCP 05/29/2023, UA negative for blood, nitrite, leuks.  CBC normal.  WBC 6.5.  Urine culture mixed urogenital flora.

## 2023-06-12 DIAGNOSIS — I10 Essential (primary) hypertension: Secondary | ICD-10-CM | POA: Diagnosis not present

## 2023-06-16 DIAGNOSIS — H469 Unspecified optic neuritis: Secondary | ICD-10-CM | POA: Diagnosis not present

## 2023-06-16 DIAGNOSIS — G35 Multiple sclerosis: Secondary | ICD-10-CM | POA: Diagnosis not present

## 2023-06-17 DIAGNOSIS — R400 Somnolence: Secondary | ICD-10-CM | POA: Diagnosis not present

## 2023-06-17 DIAGNOSIS — G4733 Obstructive sleep apnea (adult) (pediatric): Secondary | ICD-10-CM | POA: Diagnosis not present

## 2023-06-22 DIAGNOSIS — Z1159 Encounter for screening for other viral diseases: Secondary | ICD-10-CM | POA: Diagnosis not present

## 2023-06-29 DIAGNOSIS — Z1159 Encounter for screening for other viral diseases: Secondary | ICD-10-CM | POA: Diagnosis not present

## 2023-07-13 DIAGNOSIS — G35 Multiple sclerosis: Secondary | ICD-10-CM | POA: Diagnosis not present

## 2023-07-13 DIAGNOSIS — H5111 Convergence insufficiency: Secondary | ICD-10-CM | POA: Diagnosis not present

## 2023-07-13 DIAGNOSIS — H5452A1 Low vision left eye category 1, normal vision right eye: Secondary | ICD-10-CM | POA: Diagnosis not present

## 2023-07-13 DIAGNOSIS — H54511A Low vision right eye category 1, normal vision left eye: Secondary | ICD-10-CM | POA: Diagnosis not present

## 2023-07-14 DIAGNOSIS — I10 Essential (primary) hypertension: Secondary | ICD-10-CM | POA: Diagnosis not present

## 2023-07-15 DIAGNOSIS — R400 Somnolence: Secondary | ICD-10-CM | POA: Diagnosis not present

## 2023-07-15 DIAGNOSIS — G4733 Obstructive sleep apnea (adult) (pediatric): Secondary | ICD-10-CM | POA: Diagnosis not present

## 2023-07-15 NOTE — Unmapped External Note (Signed)
  [facility] [address] [place] [address] [phone] Fax: [phone]   PHYSICIAN SIGNATURE REQUIRED  Thank you for your referral. Please review the following Plan of Care and sign electronically, or fax signed paper copy to: [phone].   Please call Dept: [phone] with any questions or concerns.  Rollo Ellen, OT/L  Occupational Therapy Plan of Care  Initial Evaluation Diagnosis:  1. MS (multiple sclerosis) (CMD)      2. Low vision of left eye      3. Low vision of right eye      4. Convergence insufficiency           Impairment List: Activity tolerance, ADL status, Balance, Cognition, Endurance, Equipment needs, Fall risk, Functional limitations, Functional mobility, IADLs, Patient/family education needs, Safe judgement during ADL, Visual deficit  Plan and Recommendations: Treatment Interventions: Neuromuscular re-education [phone]); Selfcare/ADL Training (02464); Therapeutic activity (97530) OT Frequency: 1x/week OT Duration: -- (5 visits)   Goals:  Goals Addressed             This Visit's Progress   . OT Goal       Patient and caregiver will be able to complete convergence exercises.   07/13/23 started today  Patient will be instructed with options for reading book (including auditory methods) and increase PSF score from 0 to 5.  Patient will be instructed with use of magnifiers and be able to read labels and improve PSF score from 5 to 8.  Patient will be assisted with making adaptations to his phone to be able to read texts, make phone calls, read other things on phone. Improve PSF score with calling his wife and emergency number from 0 to 8.  Patient and caregive        Certification: This is to certify that the above named patient, who is under my care, requires skilled Therapy services as described in the above treatment plan. I further certify that  the services outlined in this plan are skilled and medically necessary. I have reviewed this plan for rehabilitation services, and I recommend that these services continue to meet the above stated goals and plan.

## 2023-07-28 ENCOUNTER — Encounter: Payer: Self-pay | Admitting: Neurology

## 2023-07-28 ENCOUNTER — Ambulatory Visit: Payer: Medicare HMO | Admitting: Neurology

## 2023-07-28 ENCOUNTER — Telehealth: Payer: Self-pay

## 2023-07-28 VITALS — BP 131/80 | HR 80 | Ht 71.0 in | Wt 194.0 lb

## 2023-07-28 DIAGNOSIS — G35 Multiple sclerosis: Secondary | ICD-10-CM | POA: Diagnosis not present

## 2023-07-28 DIAGNOSIS — G4733 Obstructive sleep apnea (adult) (pediatric): Secondary | ICD-10-CM | POA: Insufficient documentation

## 2023-07-28 DIAGNOSIS — R269 Unspecified abnormalities of gait and mobility: Secondary | ICD-10-CM | POA: Diagnosis not present

## 2023-07-28 DIAGNOSIS — R4189 Other symptoms and signs involving cognitive functions and awareness: Secondary | ICD-10-CM

## 2023-07-28 MED ORDER — DULOXETINE HCL 60 MG PO CPEP: 60.0000 mg | ORAL_CAPSULE | Freq: Every day | ORAL | 3 refills | Status: AC

## 2023-07-28 MED ORDER — BACLOFEN 10 MG PO TABS: 10.0000 mg | ORAL_TABLET | Freq: Three times a day (TID) | ORAL | 3 refills | Status: AC

## 2023-07-28 NOTE — Patient Instructions (Signed)
Check MRI brain and cervical spine

## 2023-07-28 NOTE — Progress Notes (Signed)
 ASSESSMENT AND PLAN 53 y.o. year old male   Relapsing remitting Multiple Sclerosis Worsening Memory Loss/Cognitive Impairment Gait Abnormality, Frequent Falls Depression, PTSD -[name], finished 2nd dose in September 2018 -MRI of the brain with and without contrast August 2024 showed stable advanced atrophy and diffuse white matter disease consistent with MS.  No new lesions. -Second opinion at [facility] in October 2023 with Dr. Gaynelle Adu, started on amantadine for fatigue, continue baclofen, Ampyra -Worsening gait abnormality, frequent falls, reviewed with Dr. [name], will repeat MRI brain and cervical spine, follow up after imaging to discuss plans -Continue Cymbalta 60 mg daily, Baclofen 10 mg TID, Ampyra 10 mg BID (patient has continued this, Dr. [name] mentioned stopping, will keep for now, readdress in future)   OSA on CPAP -AHI is much improved with reduced set pressure from 12 to 11. AHI improved from 16 down to 8 (2.4 Central, 3.8 Obstructive, 0.6 unknown). For now, will continue current settings, recheck download in September at our visit. If his apnea increases with centrals will consider formal in lab titration study. He feels more rested. Also stopped Seroquel, amantadine.   DIAGNOSTIC DATA (LABS, IMAGING, TESTING) - I reviewed patient records, labs, notes, testing and imaging myself where available.  Optometrist Dr. Genevie Cheshire evaluation October 16, 2022 bilateral optic neuritis, 20/40-20/80, severe optic nerve RNFL thinning, OD more than OS, declining bilateral visual acuity, HVF, right homonymous hemianopia,  Of my evaluation by Dr. Genevie Cheshire on January 15, 2023, bilateral optic neuritis, OD more than OS,  severe optic nerve RNFL staining, declining BCVA, was 20/80 OD, 20/70 OS, normal 20/200 OD, 20/80 OS Patient was referred to neuro-ophthalmologist to ensure no other underlying cause  Ophthalmology evaluation 06/16/23 [facility] with Dr. Norton Blizzard, visual complaints likely due  to optic nerve atrophy and exotropia. MOG was negative. B12 499  HISTORY OF PRESENT ILLNESS:[name] is a 53 years old right-handed [place] American male, follow-up for relapsing remediating multiple sclerosis   He moved from [place] to [place] in 2013, was previously under the care of [facility] MS specialist Dr. Clabe Seal,   He was diagnosed with relapsing remitting multiple sclerosis in early 2007, presenting with right face, right leg weakness, has mild gait difficulty, diagnosis was confirmed by marked abnormal MRI of brain, and cervical lesions    He was initially treated with Betaseron for 18 months, clinically he was stable, but there was enhancing MRI lesions on Betaseron, he developed positive interferon antibodies.  Tysabri was started since February 2009, initially every 4 weeks, later stretching to every 6 weeks, JC virus antibody was negative in September 2009 and April 2010, but JC virus antibody began to be positive since September 2010, he tolerates the [place] infusion very well from Feb 2009 till Jan 2013. He has repeat MRI of brain every 6 months.     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  He was treated with [name] from January 24th, 2014 for 6 months, but he continued to have slow progressive worsening gait difficulty, fell multiple times, had few rounds of steroid.  Abnormal visual evoked response test secondary to prolongation of the P100 waveform on the left.     He now complains of constant gait difficulty, bilateral lower extremity spasticity, also complains of urinary incontinence, double vision, he could no longer driving,  also complains of  worsening memory trouble, difficulty focusing, could not take care of his young children by himself.      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, index value is 4.15 in June  2014, 2.66 in 11/22/2013   I have discussed treatment options with patient and his wife, he had progressive worsening gait difficulty, memory trouble, vision problem since he was off [place] taking [name] treatment,  His symptoms were relatively stable while taking Quay Burow is 10/998 chance of PML,  He restarted on [name] infusion  since Oct 14th 2014  UPDATE Feb 11th 2016:YY His walking has much improved with Ampyra twice a day, no significant side effect, he did not had MRI of brain, cervical spine as previously scheduled,     UPDATE October 10 2014:YY He was admitted to hospital March 28-30 2016 for flulike illness, he had much worsening gait difficulty ever since, now rely on his forefoot cane, he also complains of worsening nocturia, wake up multiple times during night, depressed he could not contribute much to his family, he is still waiting for social disability applications.    Update August the second 2016:YY He continue complains of gait difficulty, right worse than left, rely on his cane, Effexor has been helpful for his depression, complains of worsening memory trouble Anti-[name] antibody was negative. He and his wife concerned about the side effect of lemtrada, wants to stay on Tysabri.   I have reviewed and compared MRI scan in June 2016 at [facility] to February 2016 MRIs, multiple supratentorium lesions, T1 black holes, no contrast enhancement, no significant change compared to previous scans,   UPDATE Feb 21 2015:YY He started to feel increaesd weakness since Nov 1st, right leg worse than left, he took a shower, his leg gave out, fell down, difficult to get out of bathtub, he also has chronic constipations    UPDATE May 09 2015:YY He was treated with one course of Medrol pack, his walking has improved some, but overall has slow progress in gait difficulty, worsening memory trouble, he can no longer babysit his young children at home, mild depression, Effexor has  made his depression worse, worsening constipations, failed to improve by over-the-counter medications   UPDATE August 13 2015:Duanne Guess [name] infusion was in July 09 2015, he was evaluated by Dr. Epimenio Foot in March 20 eighth 2017, he is at high risk to develop complication with continued [name] infusion, in addition, he continue have progressive worsening gait difficulty on [name], frequent flareups, we decided to proceed with Claris Pong OCt 16th 2017:YY He had first dose of lemtrada from July 10-14th, 2017 tolerate it well.  I have reviewed hospital admission on November 04 2015 to [facility],   Patient woke up November 03 2015 was found to be confused, had a fever of 102, he just finished Lematrada infusion on July 14th 2017, upon presentation, blood pressure was 119/54, heart rate of 79, temperature 99.2, patient was documented as low alert, no skin rash, blood culture was sent, he was treated with Levaquin, next   CT of the chest without contrast showed minimum bilateral dependent subsegmental atelectasis, no acute abnormality, MRI of the brain with and without contrast on November 05 2015 showed advanced chronic demyelinating disease throughout the brain, unchanged from January 2017, no new lesion  X-rays was negative for pneumonia,   EKG showed sinus rhythm with heart rate of 97 Laboratory evaluation, glucose 94, creatinine 0.91, and Was mildly elevated 13 point 9, CPK was 282, WBC was 7.8, hemoglobin was 13  point 5, neutrophil was 6 5.6%, influenza a antibody was positive, UA showed negative leukoesterase, negative nitrate,   He was treated with antibiotics, IV solution, his symptoms quickly improved over 4 days, he is now at his baseline, ambulate with a walker, he does not drive, his sister stays with him during the day, he watches TV,  He complains of worsening visual problem, he was seen by optometrist, he could see close vision, but could not read far away.    UPDATE October 02 2016: He denies any significant change, he ambulate with a cane, he continue have significant memory loss, denies significant depression, mild fatigue, he attend adult day program.  He continues to have mild to moderate constipation, bladder urgency.   He will have repeat Lematrada infusion in July 2018, 2 days following previous infusion on November 03 2016, he was admitted to the hospital for fever, symptoms quickly improved with antibiotic treatment, but UA showed negative leukoesterase, negative nitrate, CT chest showed minimum bilateral dependent actelesis, no acute abnormality  He has monthly lab by Lemetrada at the middle of month.   UPDATE Sept 6th 2018: He tolerates second round of Lemetrada well in 2018, finished infusion last week, no fever noted. He has baseline gait abnormality, no significant worsening, also complains of mild depression anxiety insomnia taking melatonin, Ambien as needed   UPDATE September 30 2017: MRI of the brain 04/03/2017 unchanged with severe burden of demyelinating disease without active demyelinating lesions when compared to MRI in 2017.    He is overall fairly stable, laboratory evaluations was within normal range, CD4 count is trending up,  has stopped the Valtrex treatment, he fell showering April 2019 in with 2 broken ribs on the left side, now recovered, there is no significant change in his gait abnormality  Update June 27, 2019: He is accompanied by his wife at today's clinical visit, continue complains of significant gait abnormality, fell every few weeks, ambulate with a forefoot cane, continue to feel with chronic fatigue, depression, insomnia, is also under the care of [facility] for [facility], recently stopped the Ambien, started on Seroquel in February 2021, he can sleep better.  He spent most of the day sitting, watching TV, reading books, also has memory loss, rely on his wife to fix his medication box.  UPDATE Mar 07 2020: He was treated with UTI at end of  September 2021, he is overall stable, continue have gait abnormality, mental slowing, fatigue, urinary urgency, on polypharmacy treatment, also carries a diagnosis depression, PTSD, Seroquel 25 mg every night has been helpful  UPDATE May 20 2021: He is accompanied by his wife at today's visit, she works from home, patient was noted to have gradual worsening gait abnormality, memory loss, could not carry on much of household obligations, sitting most of the time, do minor exercise regularly,  He continues to have nocturia, improved by oxybutynin,  UPDATE October 20 2022: He is accompanied by his wife at today's clinical visit, was seen by optometrist recently, concerning visual change, patient himself does not complain, he become very sedentary, passive, sit down most of the time, wife reported worsening cognitive malfunction, slow reaction, lack of motivations, taking quetiapine 25 every night does help him sleep  Tried dalfampridine 10 mg twice a day, amantadine did not help his symptoms much,  Update July 28, 2023 SS: Last visit with Dr. [name] ordered PT, MRI of the brain showing stable advanced atrophy, diffuse white matter disease (MS) no new lesions. Saw  Dr. Frances Furbish in Sept 2024, reduced pressure from 12 to 11 for central apneas (AHI 16).  CPAP review shows 100% usage, 87% greater than 4 hours, AHI 8.5 (2.4 centrals, 3.8 obstructive) set pressure 11.,  Mask leak 29.5. ESS 8. Wife called in Feb with falls, gait changes, PCP stopped statin, Seroquel. Little better, but falls frequent, drags his right leg. Dr. [name] stopped amantadine, still on Ampyra. Going to adult day program 3 days a week. Did PT, but not much help. Feels well with CPAP, feels less sleepy. Still falls daily, moves too fast, doesn't realize cognitively right leg weakness. Using walker.   PHYSICAL EXAM Vitals:   05/20/21 0956  BP: 133/81  Pulse: 83  Weight: 208 lb 8 oz (94.6 kg)  Height: 5\' 11"  (1.803 m)   Body mass index is 29.08  kg/m.  Gen: NAD, conversant, well nourised, well groomed                     Cardiovascular: Regular rate rhythm, no peripheral edema, warm, nontender. Eyes: Conjunctivae clear without, dried drainage to lashes   NEUROLOGICAL EXAM:  MENTAL STATUS: Speech/cognition:    Speech is slow, slow reaction time, very nice , pleasant, smiling    10/20/2022   12:00 PM 05/20/2021   10:00 AM  Montreal Cognitive Assessment   Visuospatial/ Executive (0/5) 1 3  Naming (0/3) 2 3  Attention: Read list of digits (0/2) 2 2  Attention: Read list of letters (0/1) 1 1  Attention: Serial 7 subtraction starting at 100 (0/3) 0 0  Language: Repeat phrase (0/2) 1 2  Language : Fluency (0/1) 0 0  Abstraction (0/2) 1 2  Delayed Recall (0/5) 0 0  Orientation (0/6) 1 3  Total 9 16    CRANIAL NERVES: CN III, IV, VI: extraocular movement are normal. No ptosis. Narrow eye open CN V: Facial sensation is intact CN VII: Face is symmetric with normal eye closure and smile. CN VIII: Hearing is normal to casual conversation CN IX, X: Phonation is normal. CN XI: Head turning and shoulder shrug are intact  MOTOR: Moderate bilateral lower extremity spasticity, right worse than left, mild right upper extremity weakness, mild right hip flexion weakness   REFLEXES: Reflexes are hyperreflexia, and symmetric at the biceps, triceps, knees, and ankles.  SENSORY: Decreased soft touch to right leg  COORDINATION: Mild dysmetria with finger-nose-finger bilaterally  GAIT/STANCE: In wheelchair, needs assistance to stand, is unsteady, tends to lean backwards  REVIEW OF SYSTEMS: Out of a complete 14 system review of symptoms, the patient complains only of the following symptoms, and all other reviewed systems are negative.  See HPI  ALLERGIES: No Known Allergies  HOME MEDICATIONS: Outpatient Medications Prior to Visit  Medication Sig Dispense Refill   baclofen (LIORESAL) 10 MG tablet Take 1 tablet (10 mg total) by  mouth 3 (three) times daily. 270 tablet 1   Cholecalciferol (VITAMIN D PO) Take by mouth 2 (two) times daily. Takes 2000units daily     dalfampridine 10 MG TB12 TAKE 1 TABLET BY MOUTH TWICE A DAY 60 tablet 11   DULoxetine (CYMBALTA) 60 MG capsule Take 1 capsule (60 mg total) by mouth daily. 90 capsule 1   multivitamin-iron-minerals-folic acid (CENTRUM) chewable tablet Chew 1 tablet by mouth daily.     omega-3 acid ethyl esters (LOVAZA) 1 G capsule Take 1,000 mg by mouth 2 (two) times daily.     prazosin (MINIPRESS) 5 MG capsule Take 5 mg by mouth  at bedtime.     lactulose (CHRONULAC) 10 GM/15ML solution TAKE 15 MLS (10 GM TOTAL) BY MOUTH 3 TIMES FOR CONSTIPATION. 4050 mL 3   QUEtiapine (SEROQUEL) 25 MG tablet Take 1 tablet (25 mg total) by mouth at bedtime. 90 tablet 1   No facility-administered medications prior to visit.    PAST MEDICAL HISTORY: Past Medical History:  Diagnosis Date   Multiple sclerosis (HCC) 04/21/2005   Optic neuritis    Sleep apnea     PAST SURGICAL HISTORY: Past Surgical History:  Procedure Laterality Date   APPENDECTOMY  53 years old   VASECTOMY  08/2013    FAMILY HISTORY: Family History  Problem Relation Age of Onset   High blood pressure Mother    Sleep apnea Mother    High blood pressure Father     SOCIAL HISTORY: Social History   Socioeconomic History   Marital status: Married    Spouse name: [name]   Number of children: 2   Years of education: 12   Highest education level: Not on file  Occupational History    Comment: Disabled  Tobacco Use   Smoking status: Never   Smokeless tobacco: Never  Substance and Sexual Activity   Alcohol use: No   Drug use: No   Sexual activity: Not on file  Other Topics Concern   Not on file  Social History Narrative   Patient lives at home with his wife [name]). Patient has two children. Patient is disabled.   Right handed.   Caffeine- one cup daily.   Social Drivers of Corporate investment banker  Strain: Not on file  Food Insecurity: No Food Insecurity (11/28/2021)   Hunger Vital Sign    Worried About Running Out of Food in the Last Year: Never true    Ran Out of Food in the Last Year: Never true  Transportation Needs: Unmet Transportation Needs (11/28/2021)   PRAPARE - Administrator, Civil Service (Medical): No    Lack of Transportation (Non-Medical): Yes  Physical Activity: Not on file  Stress: Not on file  Social Connections: Not on file  Intimate Partner Violence: Not on file   Otila Kluver, DNP  [facility] [address] [place], [place] [address] [phone]

## 2023-07-28 NOTE — Telephone Encounter (Signed)
 Received fax [facility]ill request for [facility] quetiapine 25mg  at [facility]. This isn't on his med list. But looks like was recently prescribed can we reorder?

## 2023-08-06 ENCOUNTER — Telehealth: Payer: Self-pay | Admitting: Neurology

## 2023-08-06 NOTE — Telephone Encounter (Signed)
 MRI cervical [employer] medicare auth: [id number] exp. 08/06/23-02/02/24  MRI brain [employer] auth: [id number] exp. 08/06/23-02/02/24 sent to [facility] [phone]

## 2023-08-06 NOTE — Telephone Encounter (Signed)
 Gabby @[facility] called to request a refill for pt's QUEtiapine (SEROQUEL) 25 MG tablet .  Phone rep informed her that Discontinued by: CMA on 07/28/2023 09:48

## 2023-08-10 NOTE — Unmapped External Note (Signed)
 Occupational Therapy Discharge Summary  Payor: [employer] MEDICARE ADV / Plan: AETNA MA / Product Type: Medicare Advantage /    Referring Diagnosis: MS, Low Vision, Convergence Insufficiency   Referring Clinician:  Chen, Amalie, MD Therapy Diagnosis:   1. MS (multiple sclerosis)    (CMD)      2. Low vision of right eye      3. Low vision of left eye      4. Convergence insufficiency       Rehabilitation Precautions/Restrictions:   Precautions/Restrictions Precautions: fall risk; impaired vision    Initial Evaluation Date:  07/13/23  Reporting Period:  07/13/23 to 08/10/23 Number of Visits to Date: Visit Count: 3 Number of Missed Visits: No data recorded /0    SUBJECTIVE I think you have given us  useful ideas. Current Occupation:  Current: disability  Pain:  Pain Assessment Pain Assessment: No/denies pain   OBJECTIVE  General Observation:  Patient in transportation accompanied by his wife, [name].  Outcome Measure:  PSF Scale Patient-Specific Functional Scale (PSFS) Activity 1: reading book Answer: 3 Activity 2: reading label Answer: 6 Activity 3: use phone to call wife Answer: 4 Sum of Activity Scores: 13   Interventions: Therapeutic Activities: 75 mins Extensive time spent in assistive technology lab focused on instructions and trialing equipment for reading text, spot reading, voice activation for phone.  For reading sentences and books: using magnifer on phone, hand held magnifer, mangifying sheet and stand magnifier, kindle and ipad tablets. He did best with larger tablet (10 inch) with font size maximized.  For spot reading: tried 2 different hand held magnifiers, hands free magnifier (wears around neck) and magnifer app on phone. Does best with hand-free and one on phone to take picture and then manually enlarge (needs cueing for this).  Also used Seeing AI app to both read information and to take picture and read.  Based on assessment of vision the 5x  magnifier should work best.      Education:  Yes, as described in interventions and patient instructions.   ASSESSMENT  Therapy Program to Date: In summary, the therapy program has included: evaluation, education, instruction, equipment trial and decision making.  Progress Towards Goals:      Goals Addressed             This Visit's Progress   . COMPLETED: OT Goal       Patient and caregiver will be able to complete convergence exercises.   07/13/23 started today   08/10/23 needs assistance from caregiver  Patient will be instructed with options for reading book (including auditory methods) and increase PSF score from 0 to 5.   08/10/23 able to read sentences on Tablet with increased font, PSF 3  Patient will be instructed with use of magnifiers and be able to read labels and improve PSF score from 5 to 8.   08/03/23 used magnifier on app and Seeing AI for this, needs repetition and practice  08/10/23 needs assistance to use magnifier app, PSF 6  Patient will be assisted with making adaptations to his phone to be able to read texts, make phone calls, read other things on phone. Improve PSF score with calling his wife and emergency number from 0 to 8.   08/03/23 added voice assistant and worked on using for making calls and opening apps   08/10/23 needs cueing to use Google voice to make calls, PSF 4         Progress Summary: Patient with severe visual  acuity deficits, impaired coordination UE, weakness and mobility deficits and cognitive deficits (memory and problem solving). The deficits in cognition impact ability to utilize some of the assistive technology on his own.   Patient with specific goals related to reading---tablet with enlarged font size appears to work best. Apps were added to his phone to aid with spot reading. Tactile cueing with puff paint or bump dots should help with locating buttons on remote.  Time spent with use of voice assist for phone calls and  TV remote use.  Also instructed with exercises for convergence.  His wife very involved and supportive.  PLAN  Recommendations:  Occupational Therapy services are discontinued at this time secondary to:  all goals addressed, needs practice and repetition at home.  Development of Plan of Care:  The discharge plan was discussed and agreed upon by the patient and/or family.  Total Treatment Time (Time & Untimed): OT Total Treatment Time: 75 Total Time in Timed Codes: OT Total Timed Code Minutes: 75     Treatment/Procedures Therapeutic Actvity Direct Pt Contact minutes: 75

## 2023-08-12 DIAGNOSIS — G35 Multiple sclerosis: Secondary | ICD-10-CM | POA: Diagnosis not present

## 2023-08-25 ENCOUNTER — Ambulatory Visit (HOSPITAL_COMMUNITY)
Admission: RE | Admit: 2023-08-25 | Discharge: 2023-08-25 | Disposition: A | Discharge status: Home / Self Care | Source: Ambulatory Visit | Attending: Neurology | Admitting: Neurology

## 2023-08-25 DIAGNOSIS — G379 Demyelinating disease of central nervous system, unspecified: Secondary | ICD-10-CM | POA: Diagnosis not present

## 2023-08-25 DIAGNOSIS — G35 Multiple sclerosis: Secondary | ICD-10-CM

## 2023-08-25 DIAGNOSIS — R9089 Other abnormal findings on diagnostic imaging of central nervous system: Secondary | ICD-10-CM | POA: Diagnosis not present

## 2023-08-25 DIAGNOSIS — M4802 Spinal stenosis, cervical region: Secondary | ICD-10-CM | POA: Diagnosis not present

## 2023-08-25 DIAGNOSIS — M5031 Other cervical disc degeneration,  high cervical region: Secondary | ICD-10-CM | POA: Diagnosis not present

## 2023-08-25 MED ORDER — GADOBUTROL 1 MMOL/ML IV SOLN
10.0000 mL | Freq: Once | INTRAVENOUS | Status: AC | PRN
  Administered 2023-08-25: 10 mL via INTRAVENOUS

## 2023-09-02 ENCOUNTER — Telehealth: Payer: Self-pay | Admitting: Neurology

## 2023-09-02 NOTE — Telephone Encounter (Signed)
 I tried to call the patient, I left a message with his wife, I will send my chart note.  I reviewed imaging with Dr. Gracie Lav.  MRI brain is stable with similar cerebral volume loss and moderate white matter changes compatible with MS.  No evidence of any acute lesion process.  MRI cervical spine shows stable chronic diffuse MS appearance, no new lesions identified since 2021.  There is moderate to severe neuroforaminal stenosis on the right C4-C7 (Neural foraminal stenosis occurs when the spaces between the vertebrae in the spine narrow, compressing the nerve roots). If any neck pain or right sided symptoms, may consider some PT. Please switch next office visit to with Dr. Gracie Lav. Thanks   IMPRESSION:   Similar cerebral volume loss with moderate white matter changes compatible with the patient's history of multiple sclerosis. No evidence for active demyelinating disease  IMPRESSION: 1. Widespread chronic cervical spinal cord demyelinating disease appears stable since 2021. No new or active demyelinating disease identified.   2. Chronic cervical spine degeneration. No significant spinal stenosis. But progressive right side neural foraminal stenosis since 2021, now moderate or severe at the right C4 through C7 nerve levels.

## 2023-09-02 NOTE — Telephone Encounter (Signed)
 Spoke with patient's wife and rescheduled appointment with Dr. Gracie Lav for 01/07/24 at 10:30am

## 2023-09-14 DIAGNOSIS — R400 Somnolence: Secondary | ICD-10-CM | POA: Diagnosis not present

## 2023-09-14 DIAGNOSIS — G4733 Obstructive sleep apnea (adult) (pediatric): Secondary | ICD-10-CM | POA: Diagnosis not present

## 2023-11-09 ENCOUNTER — Encounter (HOSPITAL_COMMUNITY): Payer: Self-pay

## 2023-11-09 DIAGNOSIS — I639 Cerebral infarction, unspecified: Secondary | ICD-10-CM | POA: Diagnosis not present

## 2023-11-09 DIAGNOSIS — B9620 Unspecified Escherichia coli [E. coli] as the cause of diseases classified elsewhere: Secondary | ICD-10-CM | POA: Diagnosis not present

## 2023-11-09 DIAGNOSIS — F0390 Unspecified dementia without behavioral disturbance: Secondary | ICD-10-CM | POA: Diagnosis not present

## 2023-11-09 DIAGNOSIS — R079 Chest pain, unspecified: Secondary | ICD-10-CM | POA: Diagnosis not present

## 2023-11-09 DIAGNOSIS — G35 Multiple sclerosis: Secondary | ICD-10-CM | POA: Diagnosis not present

## 2023-11-09 DIAGNOSIS — Z8669 Personal history of other diseases of the nervous system and sense organs: Secondary | ICD-10-CM | POA: Diagnosis not present

## 2023-11-09 DIAGNOSIS — I998 Other disorder of circulatory system: Secondary | ICD-10-CM | POA: Diagnosis not present

## 2023-11-09 DIAGNOSIS — G9341 Metabolic encephalopathy: Secondary | ICD-10-CM | POA: Diagnosis not present

## 2023-11-09 DIAGNOSIS — M4803 Spinal stenosis, cervicothoracic region: Secondary | ICD-10-CM | POA: Diagnosis not present

## 2023-11-09 DIAGNOSIS — Z79899 Other long term (current) drug therapy: Secondary | ICD-10-CM | POA: Diagnosis not present

## 2023-11-09 DIAGNOSIS — Z1152 Encounter for screening for COVID-19: Secondary | ICD-10-CM | POA: Diagnosis not present

## 2023-11-09 DIAGNOSIS — R4781 Slurred speech: Secondary | ICD-10-CM | POA: Diagnosis not present

## 2023-11-09 DIAGNOSIS — I959 Hypotension, unspecified: Secondary | ICD-10-CM | POA: Diagnosis not present

## 2023-11-09 DIAGNOSIS — R531 Weakness: Secondary | ICD-10-CM | POA: Diagnosis not present

## 2023-11-09 DIAGNOSIS — M4802 Spinal stenosis, cervical region: Secondary | ICD-10-CM | POA: Diagnosis not present

## 2023-11-09 DIAGNOSIS — Z792 Long term (current) use of antibiotics: Secondary | ICD-10-CM | POA: Diagnosis not present

## 2023-11-09 DIAGNOSIS — N3000 Acute cystitis without hematuria: Secondary | ICD-10-CM | POA: Diagnosis not present

## 2023-11-09 DIAGNOSIS — I10 Essential (primary) hypertension: Secondary | ICD-10-CM | POA: Diagnosis not present

## 2023-11-09 DIAGNOSIS — F02818 Dementia in other diseases classified elsewhere, unspecified severity, with other behavioral disturbance: Secondary | ICD-10-CM | POA: Diagnosis not present

## 2023-11-09 DIAGNOSIS — R54 Age-related physical debility: Secondary | ICD-10-CM | POA: Diagnosis not present

## 2023-11-09 DIAGNOSIS — R29810 Facial weakness: Secondary | ICD-10-CM | POA: Diagnosis not present

## 2023-11-09 NOTE — ED Triage Notes (Signed)
 Pt. BIB EMS for stroke alert. LKW 1400 as per pt., Pt. C/o R sided weakness, slurred speech and confusion.

## 2023-11-09 NOTE — ED Notes (Signed)
 ED Procedure Note  EKG Interpretation  Date/Time: 11/09/2023 9:51 PM  Performed by: [place] Norleen Agent, MD Authorized by: Edith Endave Norleen Agent, MD   ECG interpreted by ED Physician in the absence of a cardiologist: yes   Previous ECG:    Previous ECG:  Unavailable Interpretation:    Interpretation: normal   Rate:    ECG rate:  109   ECG rate assessment: normal   Rhythm:    Rhythm: sinus rhythm   Ectopy:    Ectopy: none   QRS:    QRS axis:  Normal   QRS intervals:  Normal   QRS conduction: normal   ST segments:    ST segments:  Normal T waves:    T waves: normal   Q waves:    Abnormal Q-waves: not present   Comments:     Sinus rhythm at 109 no acute ST-T wave changes normal intervals

## 2023-11-09 NOTE — ED Provider Notes (Addendum)
 Emergency Department Provider Note    ED Clinical Impression   Final diagnoses:  Acute stroke due to ischemia    (Primary)  Multiple sclerosis     Accelerated hypertension  Fever, unspecified fever cause    ED Assessment/Plan    History   Chief Complaint  Patient presents with  . Stroke Alert   HPI  2020 hrs. Patient 53 year old with complex medical history previous history of multiple sclerosis 4 hours ago began having increasing speech difficulty and right-sided weakness and numbness he has previous history of multiple sclerosis as noted above no other information available no trauma exam is awake alert cooperative.  Cardiorespiratory general neuroexam negative abdomen benign nutritional records will be reviewed plan acute stroke alert onset of symptoms 4 hours ago NIHSS score in the 3-4 range might possibly be within the window however complicated by superimposed medical issues  Past Medical History[1]  Past Surgical History[2]  Family History[3]  Social History[4]  Review of Systems  Respiratory:  Negative for chest tightness and shortness of breath.   Neurological:  Positive for weakness and numbness. Negative for dizziness and light-headedness.  All other systems reviewed and are negative.   Physical Exam   BP 151/80   Pulse 114   Temp (!) 38.3 C (100.9 F) (Oral)   Resp 22   Ht 182.9 cm (6')   Wt 95.7 kg (211 lb)   SpO2 95%   BMI 28.62 kg/m   Physical Exam  Vital signs have been reviewed. Patient is well-appearing w/o respiratory distress shock or major trauma. HEENT is atraumatic.  Neck shows unimpaired range of motion. Chest No increased work of breathing or audible wheezing. Cardiac Good perfusion throughout. Abdomen is not distended. Extremities are without significant trauma. Skin no visualized rash.  Right-sided weakness and speech difficulty Neuro exam is grossly non-focal. Psych exam shows normal mood and behavior.  ED Course     Triage old records reviewed no anticoagulation previous history of MS no prior echocardiogram No prior CT angiography.  Most recent MRI of the brain May 2025 showing findings consistent with diagnosed MS  Medical Decision Making Acute stroke, nutrition, metabolic, history of MS  Amount and/or Complexity of Data Reviewed Independent Historian: caregiver External Data Reviewed: labs, radiology, ECG and notes. Labs: ordered. Decision-making details documented in ED Course. Radiology: ordered and independent interpretation performed. Decision-making details documented in ED Course. ECG/medicine tests: ordered and independent interpretation performed. Decision-making details documented in ED Course. Discussion of management or test interpretation with external provider(s): See dictated H&P  Risk Prescription drug management. Decision regarding hospitalization. Diagnosis or treatment significantly limited by social determinants of health. Risk Details: See dictated H&P    I have personal reviewed results of diagnostic testing including laboratory testing, diagnostic imaging and consultation.  I have also discussed the results with patient and available and family at bedside    Case reviewed with [facility] and they are in agreement we will go ahead with angiogram and consider for acute intervention with tPA otherwise probable admission  2045 hrs.  Case reviewed with teleneuro Dr. Lanier recommending aspirin and MRI of the brain and metabolic evaluation  2225 hours Case will be reviewed with [facility] patient has been seen by the hospitalist service here for admission they are recommending transfer  2240 hrs. Case reviewed with neurology Dr. Deedra at [facility] they are commending reconsideration for admission here at this facility would be willing to consider admitting him if MRI is done and shows  evidence of new problems with MS Have reapproach hospital service about  possible admission here will update the family I think admitting him to the hospital here for MRI specially given the fever may be reasonable alternative we can do an MRI tomorrow morning   Dallara, Norleen Agent, MD 11/09/23 2025    Dallara, John James, MD 11/09/23 2031    Dallara, John James, MD 11/09/23 2045    [place] Norleen Agent, MD 11/09/23 2226       [1] Past Medical History: Diagnosis Date  . MS (multiple sclerosis)      [2] No past surgical history on file. [3] No family history on file. [4] Social History Socioeconomic History  . Marital status: Married   Social Drivers of Health   Food Insecurity: No Food Insecurity (11/28/2021)   Received from [facility]   Hunger Vital Sign   . Within the past 12 months, you worried that your food would run out before you got the money to buy more.: Never true   . Within the past 12 months, the food you bought just didn't last and you didn't have money to get more.: Never true  Transportation Needs: Unmet Transportation Needs (11/28/2021)   Received from [facility] - Transportation   . Lack of Transportation (Medical): No   . Lack of Transportation (Non-Medical): Yes   Dallara, John James, MD 11/09/23 2250

## 2023-11-11 NOTE — Nursing Note (Signed)
   11/11/23 1349  Rapid Rounds  Attendance Nursing;Physician;Advanced Practice Provider (APP);Case Manager;Dietitian/Nutrition Services;Occupational Therapy;Social Work/Services;Respiratory Therapy;Pharmacy;Physical Therapy  Expected Discharge Disposition HH Services (Dispo tomorrow)  Today we still await: Clinical stability

## 2023-11-12 NOTE — Discharge Summary (Signed)
 DISCHARGE SUMMARY [facility]     Discharge date:                                 November 12, 2023 Length of stay:                                   LOS: 3 days    Discharge Service:                           [facility] Hospitalists Discharge Attending Physician:     Donnice Manus Para, DO Discharge to:                                     To Home Condition at Discharge:                   good Code status:                                     Full Code      ______________________________________   Admission HPI    Patient admitted on: 11/09/2023  8:20 PM  Patient admitted by: Reyes Geraline Harvard, MD  CHIEF COMPLAINT:  AMS, generalized weakness   Day of admission HPI:    Patient presented to ED w/ concern of increased confusion and generalized weakness. Patient has a h/o MS and baseline cognitive impairment so history obtained from wife at bedside. Patient normally goes to adult day care, and was at baseline when he was dropped off this morning. Staff noticed patient seemed more withdrawn and less active today. When wife brought him home this afternoon around 16:00, he was noticed to be weaker than his baseline. Typically  able to transfer out of wheelchair and do some ADLs, but patient not able to today. Patient's speech content also seemed more confused than normal. No focal neuro deficits noted. No recent illness or respiratory/GI issues. No recent medication changes. Patient denies pain anywhere.   Patient admitted on Home O2? - no Patient on home anticoagulant? -  no Patient admitted with Chronic home foley catheter? - no Foley catheter placed or replaced by another service prior to admission? - no   Mental Status on Admission: The patient is Alert and oriented to PERSON The patient is not Alert And oriented to TIME The patient is Alert and oriented to LOCATION     Problem List, Assessment & Plan     ASSESSMENT & PLAN (In order of descending acuity)   Acute metabolic  encephalopathy - CTA head is negative for acute findings - Likely in the setting of acute cystitis with fever - Urinalysis mildly positive, but urine culture grew greater than 100,000 CFU of E. coli consistent with a UTI - Patient has significantly improved on the morning of 7/23, he is alert to person and place, but not to time - He is able to feed himself, no longer having delirium, wife at bedside reports significant improvement in mental status - On exam, patient does have some mild weakness globally, but is able to move all extremities equally and follow commands easily   Acute cystitis without hematuria - Urinalysis  mildly positive - Urine culture grew greater than 100,000 CFU of E. coli, resistant to amoxicillin and Bactrim and doxycycline - Will discharge patient on oral Keflex to complete 7 more days of antibiotic therapy - Due to patient having risk factors for neurogenic bladder, and some unusual symptoms with incontinence, have referred patient to urology for further evaluation - If patient is having urinary incontinence from neurogenic bladder, this could be the cause of the patient's UTI, and he could be at risk for further UTIs if untreated   History of multiple sclerosis - Wife reports that when patient does have an MS flare he can get confused - MRI of the head and cervical spine ordered, showing multiple plaques consistent with history of MS - Wife reports that patient is not on any MS medications at this time   Major neurocognitive disorder - Wife reports that patient does have dementia at baseline, and does adult daycare 3 times a week - Delirium precautions   Age-related physical debility - Secondary to MS and advancing age - Will consult PT and OT - Wife reports that patient usually uses a cane at home, but is progressed to having to use a walker and a wheelchair   ADDITIONAL PATIENT FINDINGS OR OBSERVATIONS   Resting in bed comfortably with wife at  bedside Patient is alert to person and place, but not to time Patient is much more alert, able to precipitate in conversation Heart regular rate and rhythm, no murmurs or gallops Lungs clear to auscultation   DVT prophylaxis while in hospital:             enoxaparin  ______________________________________   Timeline of Significant Events: 7/21: Patient admitted to the hospital for altered mental status and fever thought to be secondary to UTI, in the setting of advanced multiple sclerosis and dementia. 7/24: Urine culture returned with E. coli greater than 100,000 CFU.  Sensitive to Keflex.    Mental Status On day of Discharge:  The patient is Alert and oriented to PERSON The patient is Alert And oriented to TIME The patient is Alert and oriented to LOCATION   CODE STATUS :                    Full Code    An advanced care planning discussion is  had with patient and/or patient's decisions maker (documented separately).   Patient discharged on Home O2? - no Patient discharged on home anticoagulant? -  no   Foley Catheter status: None Central Line Status: NONE   Time Spent on Discharge I spent greater than 30 minutes counseling and coordinating care for the discharge of this patient. The patient, wife and I discussed the importance of outpatient follow-up as well as concerning signs and symptoms that would require immediate evaluation by a medical professional. The aforementioned conversation participants understand  and did show insight. I did use teachback to ensure understanding. The above participant/s is aware that not following the discussed plan, recommendations, and follow up can lead to severe negative effects on the patient's health, up to and including death.   Discharge Medications       Your Medication List       START taking these medications     cephalexin 500 MG capsule Commonly known as: KEFLEX Take 2 capsules (1,000 mg total) by mouth two (2) times a day  for 7 days.           CHANGE how you take these medications  prazosin 5 MG capsule Commonly known as: MINIPRESS Take 2 capsules (10 mg total) by mouth two (2) times a day. What changed: See the new instructions.           CONTINUE taking these medications     baclofen  10 MG tablet Commonly known as: LIORESAL  Take 1 tablet (10 mg total) by mouth Three (3) times a day.    dalfampridine  10 mg Tb12 Take 1 tablet (10 mg total) by mouth every twelve (12) hours.    DULoxetine  60 MG capsule Commonly known as: CYMBALTA  Take 1 capsule (60 mg total) by mouth in the morning.           _____________________________________   Nutrition:                                  Diet Instructions       Discharge diet (specify)      Discharge Nutrition Therapy: Regular         ___________________________________________   Discharge Instructions    Nutrition:                                  Diet Instructions       Discharge diet (specify)      Discharge Nutrition Therapy: Regular         Activity:                                   Activity Instructions       Activity as tolerated           Appointments:                          Follow Up:                              Follow Up instructions and Outpatient Referrals    Ambulatory Referral to Palliative Care     Reason for referral: MS, CVA, Cognitive issues   Is this a palliative referral for:  symptom management coping support     Ambulatory Referral to Urology     Reason for referral: Possible neurogenic bladder, will need strategies to  prevent UTIs   Specific Services Requested: Voiding Dysfunction   What voiding dysfunction condition is this for?: Neurogenic Bladder   Call MD for:  difficulty breathing, headache or visual disturbances     Call MD for:  extreme fatigue     Call MD for:  hives     Call MD for:  persistent dizziness or light-headedness     Call MD for:  persistent nausea or vomiting     Call MD  for:  severe uncontrolled pain     Call MD for: Temperature > 38.5 Celsius ( > 101.3 Fahrenheit)     Schedule Follow-up visit with Primary Care Physician     Instructions for follow-up: 5-7 days   Reason for referral: Hospital follow up         No Known Allergies    [Past Medical History]  [Past Medical History]     Diagnosis Date  . MS (multiple sclerosis)          [Past Surgical History]   [  Past Surgical History] No past surgical history on file.   [Family History]   [Family History] History reviewed. No pertinent family history.   [Current Medications]  [Current Medications]   Current Facility-Administered Medications:  .  acetaminophen  (TYLENOL ) tablet 650 mg, 650 mg, Oral, Q4H PRN, Gigi Reyes Light, MD .  albuterol 2.5 mg /3 mL (0.083 %) nebulizer solution 2.5 mg, 2.5 mg, Nebulization, Q4H PRN, Gigi Reyes Light, MD .  baclofen  (LIORESAL ) tablet 10 mg, 10 mg, Oral, TID, Gigi Reyes Light, MD, 10 mg at 11/12/23 [phone] .  bisacodyl (DULCOLAX) EC tablet 10 mg, 10 mg, Oral, Daily PRN, Chou, Jeffrey Chieh-Li, MD .  cefTRIAXone  (ROCEPHIN ) IVPB 1 g in 50 mL dextrose (premix), 1 g, Intravenous, Q24H, Gigi Reyes Light, MD, Stopped at 11/11/23 2228 .  DULoxetine  (CYMBALTA ) DR capsule 60 mg, 60 mg, Oral, Daily, Gigi Reyes Light, MD, 60 mg at 11/12/23 [phone] .  enoxaparin (LOVENOX) syringe 40 mg, 40 mg, Subcutaneous, Q24H, Gigi Reyes Light, MD, 40 mg at 11/11/23 2145 .  ondansetron  (ZOFRAN ) injection 4 mg, 4 mg, Intravenous, Q8H PRN **OR** ondansetron  (ZOFRAN ) injection 8 mg, 8 mg, Intravenous, Q8H PRN, Gigi Reyes Light, MD .  Paitent Supplied Med - dalfampridine  ER tab 10 mg, 10 mg, Oral, Q12H, Gigi Reyes Light, MD, 10 mg at 11/12/23 1125 .  prazosin (MINIPRESS) capsule 10 mg, 10 mg, Oral, BID, Bettejane Donnice Pastor, DO, 10 mg at 11/12/23 9154   Imaging  MRI Cervical Spine W Wo Contrast Result Date: 11/10/2023 Exam:  Complete MRI  Cervical Spine without and with Contrast  History: Multiple sclerosis  Technique:  Complete MRI of the cervical spine without and with contrast.   Comparison :  11/23/2013  Findings:  Image quality is compromised by patient motion. There is no significant cervical scoliosis. There is no focal malalignment. There is no compression fracture. Marrow signal intensity is normal. There is no disc space narrowing.  There are multiple nonenhancing demyelinating plaques in the cervical spinal cord. The largest plaque is in the right side of the spinal cord at the C3 level. Allowing for considerable patient motion, there is no visible change from the last MRI. There is no Chiari malformation. There is no visible enhancement.  C1-2:  There is no cord contour deformity, canal stenosis, or foraminal narrowing.  C2-3:  There is mild left foraminal narrowing without cord contour deformity or canal stenosis.  C3-4:  There is mild bilateral foraminal narrowing without cord contour deformity or canal stenosis.  C4-5:  There is severe right and moderate/severe left foraminal narrowing without cord contour deformity or canal stenosis.  C5-6:  There is moderate/severe right and moderate left foraminal narrowing without cord contour deformity or canal stenosis.  C6-7:  There is moderate bilateral foraminal narrowing without cord contour deformity or canal stenosis.  C7-T1:  There is mild/moderate bilateral foraminal narrowing without cord contour deformity or canal stenosis.  T1-2:  There is moderate bilateral foraminal narrowing without cord contour deformity or canal stenosis.     1.    Image quality is compromised by patient motion. 2.    Multiple nonenhancing demyelinating plaques in the cervical spinal cord. 3.    Cervical spondylosis (uncovertebral osteophytes and/or facet hypertrophy) results in foraminal narrowing at multiple levels as described above. 4.    There is no cord contour deformity or canal stenosis.  Signed  (Electronic Signature): 11/10/2023 12:12 PM Signed By: Aleene JONELLE Cerise, MD   MRI Brain W Wo Contrast Result Date:  11/10/2023 Exam:  Brain MRI without and with Contrast (Multiple Sclerosis Protocol)  History:  Multiple Sclerosis.  Technique:  Complete brain MRI with and without IV contrast (multiple sclerosis protocol).  Comparison: Brain MRI 03/31/2018  Findings: Image quality is compromised by patient motion. Numerous supratentorial demyelinating plaques are present. There are numerous infratentorial demyelinating plaques visible in the posterior fossa. There are no enhancing plaques in the brain at the present time. There are numerous T1 hypointensities. Prominence of cortical sulci suggests there may be some degree of cerebral volume loss. Allowing for differences in slice location and partial volume averaging, there has been no change since the most recent comparison study. All lesions seen on the current study were present on the most recent prior study, and all lesions seen on the most recent prior study are still present. There is no evidence of PML (progressive multifocal leukoencephalopathy).  There is no intracranial hemorrhage, hydrocephalus, or mass effect. There is no acute infarction. The cerebellar tonsils are normal in shape and location.  The orbits, optic chiasm, pituitary gland, and cavernous sinuses are grossly unremarkable. Skull base flow voids are intact. Visible paranasal sinus and mastoid air cells are normal.     1. There are numerous supratentorial demyelinating plaques. 2. There are numerous infratentorial demyelinating plaques in the posterior fossa. 3. There are no enhancing plaques at the present time. 4. There are numerous T1 hypointensities. 5. There is indirect evidence of mild cerebral volume loss. 6. There has been no radiographic change since the most recent comparison study of 03/31/2018. There is no PML.  Signed (Electronic Signature): 11/10/2023 12:03 PM Signed By: Aleene JONELLE Cerise, MD   XR Chest Portable Result Date: 11/09/2023 Exam:  Chest Single Frontal View  History:  Chest pain  Technique: Single view chest  Comparison:  01/21/2022  Findings:   No airspace consolidation, pleural effusion or pneumothorax. Mediastinal contours are unremarkable. Pulmonary vasculature appears normal.      No acute cardiopulmonary abnormality.    Signed (Electronic Signature): 11/09/2023 9:36 PM Signed By: Emeline KATHEE Milroy, MD   CTA Head And Neck W Contrast Result Date: 11/09/2023 Exam: CT Angiogram of the Neck and Head with Contrast  History:  Code stroke. Right-sided weakness and slurred speech. History of multiple sclerosis.  Technique: CTA through the head and neck with IV contrast.  Image postprocessing and 3-D reconstructions at an independent workstation is performed and the rendered images that are generated are sent to the patient's image file in PACS. AEC (automated exposure control) and/or manual techniques such as size-specific kV and mAs are employed where appropriate to reduce radiation exposure for all CT exams.  Comparison:  None.  Findings:  AORTIC ARCH:  The most common configuration of arch anatomy is present. The proximal great vessels are normal in appearance.  VERTEBRAL ARTERIES:  Both vertebral arteries are normal in appearance.  RIGHT CAROTID SYSTEM:  The right common carotid artery, internal carotid artery, and external carotid artery appear normal.  LEFT CAROTID SYSTEM:  The left common carotid artery, internal carotid artery, and external carotid artery appear normal.  COMMENT: Any measurement of stenosis above is calculated using the NASCET [facility] American Symptomatic Carotid Endarterectomy Trial) methodology.  INTRACRANIAL: The posterior communicating arteries are atrophic. There is no acquired stenosis or major branch occlusion. There are no cerebral aneurysms.     1. Negative CT angiogram of the neck. 2. Negative CT angiogram of the head.  Signed (Electronic Signature):  11/09/2023 8:38 PM Signed By: Genia  Judee, MD   CT Head Wo Contrast Result Date: 11/09/2023 Exam:  CT Head without Contrast  History:  Code stroke. Slurred speech and right-sided weakness. History of multiple sclerosis.  Technique: Routine brain CT without IV contrast. AEC (automated exposure control) and/or manual techniques such as size-specific kV and mAs are employed where appropriate to reduce radiation exposure for all CT exams.  Comparison:  01/21/2022.  Findings:   BRAIN:  No CT evidence of acute infarction, hemorrhage, edema, mass or mass effect. Ventricles and basal cisterns are unremarkable. Mild cerebral atrophy. Mild hypoattenuation of the periventricular white matter.  No hyperdense arterial thrombus.  SOFT TISSUES:  Negative. CALVARIUM:  Negative. No fracture. SINUSES AND MASTOIDS:  No mucosal thickening or fluid.      1. No acute intracranial hemorrhage. No noncontrast CT evidence of acute abnormality.  2. Chronic microvascular ischemic change.  Notes: The results were called to [facility] on 11/09/2023 at 8:30 PM.  Signed (Electronic Signature): 11/09/2023 8:31 PM Signed By: Genia Judee, MD     Lab Results       Recent Labs    11/11/23 0532  WBC 5.4  HGB 13.4  HCT 41.2  PLT 234           Recent Labs    11/09/23 2054 11/09/23 2054 11/09/23 2055 11/10/23 0404 11/11/23 0532  NA 138  --   --  139 143  K 4.4  --   --  4.2 4.1  CL 102  --   --  104 108*  CO2 28.2  --   --  26.8 28.4  BUN 13  --   --  11 14  CREATININE 1.03  --   --  0.91 1.08  GLU 109  --   --  109 110  CALCIUM 8.7  --   --  8.0* 8.7  ALBUMIN 4.0  --   --   --   --   PROT 7.5  --   --   --   --   BILITOT 0.6  --   --   --   --   AST 12*  --   --   --   --   ALT 24  --   --   --   --   ALKPHOS 54  --   --   --   --   MG  --    < >  --  1.6 2.0  PHOS  --   --   --  3.2  --   LACTATE  --   --  1.0  --   --    < > = values in this interval not displayed.       Recent Labs     11/09/23 2054  TROPONINI 6  INR 1.13       Recent Labs    11/09/23 2256  WBCUA 8*  NITRITE Positive*  LEUKOCYTESUR Moderate*  BACTERIA Rare*  RBCUA 1  BLOODU Negative  GLUCOSEU Negative  PROTEINUA Trace*  KETONESU Negative    No results for input(s): OPIAU, BENZU, TRICYCLIC, PCPU, AMPHU, COCAU, CANNAU, BARBU, ETOH, ACETAMIN, SALICYLATE in the last 72 hours. No results for input(s): PREGTESTUR, PREGPOC in the last 72 hours. No results for input(s): OCCULTBLD, RAPSCRN, CDIFRPCR, CDIFFNAP1, A1C, CHOL, LDL, HDL, TRIG in the last 72 hours. No results for input(s): O2SOUR, FIO2ART, PHART, PCO2ART, PO2ART, HCO3ART, O2SATART, BEART in the last 72 hours. Pending Labs  Order Current Status    Blood Culture Preliminary result    Blood Culture Preliminary result           Home Medications       Prior to Admission medications  Medication Dose, Route, Frequency  baclofen  (LIORESAL ) 10 MG tablet 10 mg, 3 times a day (standard)  dalfampridine  10 mg Tb12 10 mg, Every 12 hours  DULoxetine  (CYMBALTA ) 60 MG capsule 1 capsule, Daily (standard)  prazosin (MINIPRESS) 5 MG capsule    cephalexin (KEFLEX) 500 MG capsule 1,000 mg, Oral, 2 times a day (standard)  prazosin (MINIPRESS) 5 MG capsule 10 mg, Oral, 2 times a day (standard)    Donnice SHAUNNA Para, DO Hospitalist, [facility] 11/12/23, 1:17 PM

## 2023-11-12 NOTE — Nursing Note (Signed)
 Discharged to home. Discharged order placed for patient, IV removed, discharge instructions were given, Transport via w/c to private car. Patient left stable.

## 2023-11-12 NOTE — Nursing Note (Signed)
   11/12/23 1437  Final Assessment  Patient's Post Acute Contact Information See demo  Has a PCP appointment been made? No  Has a specialist appointment been made? No  Post Acute Facility needed at discharge? No  Home Care/ Home Medical Equipment needed at discharge? No  Outpatient/Community Referrals needed for discharge? No  Currently receiving outpatient dialysis? No  Discharge Disposition Home w/ Self Care  Transportation Anticipated family or friend will provide  Quality data for continuing care services shared with patient and/or representative? Yes  Patient and/or family were provided with choice of facilities / services that are available and appropriate to meet post hospital care needs? N/A  IMM Delivery  Follow Up Important Message (IM) letter given to patient and/or family? Yes  IMM Delivered by The IMM was delivered and signed by authorized representative.  IMM Delivery Date 11/12/23  IMM Delivery Time 1000  The beneficiary verbalized understanding of the rights of the hospitalized patient and the right to appeal the discharge decision? Yes  The patient/authorized representative was advised they have 4 hours to consider their right to request a QIO review prior to leaving the hospital, if the physician orders discharge today. Yes  The patient/authorized representative verbalized understanding of this right. Yes  Final Assessment Complete  Final Assessment Complete Yes

## 2023-11-13 ENCOUNTER — Telehealth: Payer: Self-pay

## 2023-11-13 DIAGNOSIS — G4733 Obstructive sleep apnea (adult) (pediatric): Secondary | ICD-10-CM | POA: Diagnosis not present

## 2023-11-13 DIAGNOSIS — R400 Somnolence: Secondary | ICD-10-CM | POA: Diagnosis not present

## 2023-11-13 NOTE — Transitions of Care (Post Inpatient/ED Visit) (Signed)
 Today's TOC FU Call Status: Today's TOC FU Call Status:: Successful TOC FU Call Completed TOC FU Call Complete Date: 11/12/23 Patient's Name and Date of Birth confirmed.  Background: From DC [facility] RH notes: Day of admission HPI:  Patient presented to ED w/ concern of increased confusion and generalized weakness. Patient has a h/o MS and baseline cognitive impairment so history obtained from wife at bedside. Patient normally goes to adult day care, and was at baseline when he was dropped off this morning. Staff noticed patient seemed more withdrawn and less active today. When wife brought him home this afternoon around 16:00, he was noticed to be weaker than his baseline. Typically able to transfer out of wheelchair and do some ADLs, but patient not able to today. Patient's speech content also seemed more confused than normal. No focal neuro deficits noted.  Transition Care Management Follow-up Telephone Call Date of Discharge: 11/12/23 Discharge Facility: Other [facility]) Name of Other (Non-[facility]) Discharge Facility: [facility] [place], [place] Type of Discharge: Inpatient Admission Primary Inpatient Discharge Diagnosis:: Cystitis How have you been since you were released from the hospital?: Better (Per spouse as patient has cognitive impairments.)  Items Reviewed: Did you receive and understand the discharge instructions provided?: Yes Medications obtained,verified, and reconciled?: No Medications Not Reviewed Reasons:: Other: (Spouse declined rest of call/no need at this point, but took RN CM number in case of need. Had PCP scheduled 11/25/23.)  Medications Reviewed Today: NA; see note below Medications Reviewed Today   Medications were not reviewed in this encounter    Follow up appointments reviewed:  PCP HFU scheduled for 11/25/23 per spouse.  Pending urology referral call.   11/13/23 TOC RN CM outreach post discharge. Spouse answered call but declined full call and follow  up calls at this time.  TOC RN CM phone number was given and repeated by spouse in case she changes her mind or a need arises.  Has PCP HFU scheduled already for 11/25/23.  Ambulatory Urology referral pending call from office.   Bing Edison MSN, RN RN Case [employer]  [facility] Office Hours M-F [phone] Direct Dial: [phone] Main Phone [phone]  Fax: [phone] [email or web address]

## 2023-11-13 NOTE — Unmapped External Note (Signed)
 Clarification  I have reviewed Lawrence Lamb's chart and the following accurately represents Lawrence Lamb's condition.  Acute metabolic encephalopathy due to cystitis  Donnice Para, DO [facility] Hospitalist

## 2023-11-25 DIAGNOSIS — N390 Urinary tract infection, site not specified: Secondary | ICD-10-CM | POA: Diagnosis not present

## 2023-12-08 DIAGNOSIS — R296 Repeated falls: Secondary | ICD-10-CM | POA: Diagnosis not present

## 2023-12-08 DIAGNOSIS — Z515 Encounter for palliative care: Secondary | ICD-10-CM | POA: Diagnosis not present

## 2023-12-08 DIAGNOSIS — G35 Multiple sclerosis: Secondary | ICD-10-CM | POA: Diagnosis not present

## 2023-12-08 DIAGNOSIS — F32A Depression, unspecified: Secondary | ICD-10-CM | POA: Diagnosis not present

## 2023-12-08 DIAGNOSIS — H468 Other optic neuritis: Secondary | ICD-10-CM | POA: Diagnosis not present

## 2023-12-08 DIAGNOSIS — Z87440 Personal history of urinary (tract) infections: Secondary | ICD-10-CM | POA: Diagnosis not present

## 2023-12-08 DIAGNOSIS — R5383 Other fatigue: Secondary | ICD-10-CM | POA: Diagnosis not present

## 2023-12-08 DIAGNOSIS — M6281 Muscle weakness (generalized): Secondary | ICD-10-CM | POA: Diagnosis not present

## 2023-12-12 DIAGNOSIS — N390 Urinary tract infection, site not specified: Secondary | ICD-10-CM | POA: Diagnosis not present

## 2023-12-12 DIAGNOSIS — G35 Multiple sclerosis: Secondary | ICD-10-CM | POA: Diagnosis not present

## 2023-12-12 DIAGNOSIS — B351 Tinea unguium: Secondary | ICD-10-CM | POA: Diagnosis not present

## 2023-12-29 DIAGNOSIS — G35 Multiple sclerosis: Secondary | ICD-10-CM | POA: Diagnosis not present

## 2023-12-29 DIAGNOSIS — U071 COVID-19: Secondary | ICD-10-CM | POA: Diagnosis not present

## 2023-12-29 DIAGNOSIS — Z79899 Other long term (current) drug therapy: Secondary | ICD-10-CM | POA: Diagnosis not present

## 2023-12-29 DIAGNOSIS — R509 Fever, unspecified: Secondary | ICD-10-CM | POA: Diagnosis not present

## 2023-12-29 DIAGNOSIS — R000 Tachycardia, unspecified: Secondary | ICD-10-CM | POA: Diagnosis not present

## 2023-12-29 DIAGNOSIS — R531 Weakness: Secondary | ICD-10-CM | POA: Diagnosis not present

## 2023-12-29 DIAGNOSIS — I10 Essential (primary) hypertension: Secondary | ICD-10-CM | POA: Diagnosis not present

## 2024-01-06 ENCOUNTER — Ambulatory Visit: Payer: Medicare HMO | Admitting: Neurology

## 2024-01-07 ENCOUNTER — Ambulatory Visit: Admitting: Neurology

## 2024-01-07 ENCOUNTER — Encounter: Payer: Self-pay | Admitting: Neurology

## 2024-01-07 VITALS — BP 136/84 | Ht 70.0 in | Wt 210.0 lb

## 2024-01-07 DIAGNOSIS — G35 Multiple sclerosis: Secondary | ICD-10-CM

## 2024-01-07 DIAGNOSIS — R269 Unspecified abnormalities of gait and mobility: Secondary | ICD-10-CM | POA: Diagnosis not present

## 2024-01-07 DIAGNOSIS — R4189 Other symptoms and signs involving cognitive functions and awareness: Secondary | ICD-10-CM | POA: Diagnosis not present

## 2024-01-07 NOTE — Progress Notes (Signed)
 ASSESSMENT AND PLAN 53 y.o. year old male   Secondary progressive multiple Sclerosis Worsening cognitive impairment Slow Worsening gait Abnormality, Frequent Falls Depression, PTSD Completed [place], finished 2nd dose in September 2018, not on long-term immunomodulation therapy now MRI of the brain and cervical spine with and without contrast in May 2025, showed stable advanced atrophy and diffuse white matter disease, extensive cord involvement, no active lesion, He has been slow decline over the past few years, entered into secondary progressive MS, He had second opinion at [facility] in October 2023 with Dr. Abou Zeid, suggest symptomatic treatment, was not sure about the benefit of dalfampridine , we will taper it off, continue Cymbalta  60 mg daily, low-dose baclofen  as needed,  Continue follow-up with his primary care, only return to clinic for new issues   DIAGNOSTIC DATA (LABS, IMAGING, TESTING) - I reviewed patient records, labs, notes, testing and imaging myself where available.  Optometrist Dr. Vernell Moats evaluation October 16, 2022 bilateral optic neuritis, 20/40-20/80, severe optic nerve RNFL thinning, OD more than OS, declining bilateral visual acuity, HVF, right homonymous hemianopia,  Of my evaluation by Dr. Vernell Moats on January 15, 2023, bilateral optic neuritis, OD more than OS,  severe optic nerve RNFL staining, declining BCVA, was 20/80 OD, 20/70 OS, normal 20/200 OD, 20/80 OS Patient was referred to neuro-ophthalmologist to ensure no other underlying cause  Ophthalmology evaluation 06/16/23 [facility] with Dr. Gelene Door, visual complaints likely due to optic nerve atrophy and exotropia. MOG was negative. B12 499  HISTORY OF PRESENT ILLNESS:Mr. Bracey is a 53 years old right-handed African American male, follow-up for relapsing remediating multiple sclerosis   He moved from [place]  to [place]  in 2013, was previously under the care of [facility] MS specialist Dr.  Fairy Ip,   He was diagnosed with relapsing remitting multiple sclerosis in early 2007, presenting with right face, right leg weakness, has mild gait difficulty, diagnosis was confirmed by marked abnormal MRI of brain, and cervical lesions    He was initially treated with Betaseron for 18 months, clinically he was stable, but there was enhancing MRI lesions on Betaseron, he developed positive interferon antibodies.  Tysabri  was started since February 2009, initially every 4 weeks, later stretching to every 6 weeks, JC virus antibody was negative in September 2009 and April 2010, but JC virus antibody began to be positive since September 2010, he tolerates the [place] infusion very well from Feb 2009 till Jan 2013. He has repeat MRI of brain every 6 months.     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  He was treated with [name]  from January 24th, 2014 for 6 months, but he continued to have slow progressive worsening gait difficulty, fell multiple times, had few rounds of steroid.  Abnormal visual evoked response test secondary to prolongation of the P100 waveform on the left.     He now complains of constant gait difficulty, bilateral lower extremity spasticity, also complains of urinary incontinence, double vision, he could no longer driving,  also complains of worsening memory trouble, difficulty focusing, could not take care of his young children by himself.      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, index value is 4.15 in June 2014, 2.66 in 11/22/2013   I have discussed treatment options with patient and his wife, he had progressive worsening gait difficulty, memory trouble, vision problem since  he was off [place] taking [name]  treatment,  His symptoms were relatively stable while taking [place] [employer] is 10/998 chance of PML,  He restarted on  [name] infusion  since Oct 14th 2014  UPDATE Feb 11th 2016:YY His walking has much improved with Ampyra  twice a day, no significant side effect, he did not had MRI of brain, cervical spine as previously scheduled,     UPDATE October 10 2014:YY He was admitted to hospital March 28-30 2016 for flulike illness, he had much worsening gait difficulty ever since, now rely on his forefoot cane, he also complains of worsening nocturia, wake up multiple times during night, depressed he could not contribute much to his family, he is still waiting for social disability applications.    Update August the second 2016:YY He continue complains of gait difficulty, right worse than left, rely on his cane, Effexor  has been helpful for his depression, complains of worsening memory trouble Anti-[name] antibody was negative. He and his wife concerned about the side effect of lemtrada, wants to stay on Tysabri .   I have reviewed and compared MRI scan in June 2016 at [facility] to February 2016 MRIs, multiple supratentorium lesions, T1 black holes, no contrast enhancement, no significant change compared to previous scans,   UPDATE Feb 21 2015:YY He started to feel increaesd weakness since Nov 1st, right leg worse than left, he took a shower, his leg gave out, fell down, difficult to get out of bathtub, he also has chronic constipations    UPDATE May 09 2015:YY He was treated with one course of Medrol  pack, his walking has improved some, but overall has slow progress in gait difficulty, worsening memory trouble, he can no longer babysit his young children at home, mild depression, Effexor  has made his depression worse, worsening constipations, failed to improve by over-the-counter medications   UPDATE August 13 2015:Dorn [name] infusion was in July 09 2015, he was evaluated by Dr. Vear in March 20 eighth 2017, he is at high risk to develop complication with continued [name] infusion, in addition, he  continue have progressive worsening gait difficulty on [name], frequent flareups, we decided to proceed with [name],   UPDATE OCt 16th 2017:YY He had first dose of lemtrada from July 10-14th, 2017 tolerate it well.  I have reviewed hospital admission on November 04 2015 to [facility],   Patient woke up November 03 2015 was found to be confused, had a fever of 102, he just finished Lematrada infusion on July 14th 2017, upon presentation, blood pressure was 119/54, heart rate of 79, temperature 99.2, patient was documented as low alert, no skin rash, blood culture was sent, he was treated with Levaquin, next   CT of the chest without contrast showed minimum bilateral dependent subsegmental atelectasis, no acute abnormality, MRI of the brain with and without contrast on November 05 2015 showed advanced chronic demyelinating disease throughout the brain, unchanged from January 2017, no new lesion  X-rays was negative for pneumonia,   EKG showed sinus rhythm with heart rate of 97 Laboratory evaluation, glucose 94, creatinine 0.91, and Was mildly elevated 13 point 9, CPK was 282, WBC was 7.8, hemoglobin was 13 point 5, neutrophil was 6 5.6%, influenza a antibody was positive, UA showed negative leukoesterase, negative nitrate,   He was treated with antibiotics, IV solution, his symptoms quickly improved over 4 days, he is now at his baseline, ambulate with a walker, he does not drive, his sister stays with him during the day, he  watches TV,  He complains of worsening visual problem, he was seen by optometrist, he could see close vision, but could not read far away.    UPDATE October 02 2016: He denies any significant change, he ambulate with a cane, he continue have significant memory loss, denies significant depression, mild fatigue, he attend adult day program.  He continues to have mild to moderate constipation, bladder urgency.   He will have repeat Lematrada infusion in July 2018, 2 days  following previous infusion on November 03 2016, he was admitted to the hospital for fever, symptoms quickly improved with antibiotic treatment, but UA showed negative leukoesterase, negative nitrate, CT chest showed minimum bilateral dependent actelesis, no acute abnormality  He has monthly lab by Lemetrada at the middle of month.   UPDATE Sept 6th 2018: He tolerates second round of Lemetrada well in 2018, finished infusion last week, no fever noted. He has baseline gait abnormality, no significant worsening, also complains of mild depression anxiety insomnia taking melatonin, Ambien  as needed   UPDATE September 30 2017: MRI of the brain 04/03/2017 unchanged with severe burden of demyelinating disease without active demyelinating lesions when compared to MRI in 2017.    He is overall fairly stable, laboratory evaluations was within normal range, CD4 count is trending up,  has stopped the Valtrex  treatment, he fell showering April 2019 in with 2 broken ribs on the left side, now recovered, there is no significant change in his gait abnormality  Update June 27, 2019: He is accompanied by his wife at today's clinical visit, continue complains of significant gait abnormality, fell every few weeks, ambulate with a forefoot cane, continue to feel with chronic fatigue, depression, insomnia, is also under the care of  [facility] for [facility], recently stopped the Ambien , started on Seroquel  in February 2021, he can sleep better.  He spent most of the day sitting, watching TV, reading books, also has memory loss, rely on his wife to fix his medication box.  UPDATE Mar 07 2020: He was treated with UTI at end of September 2021, he is overall stable, continue have gait abnormality, mental slowing, fatigue, urinary urgency, on polypharmacy treatment, also carries a diagnosis depression, PTSD, Seroquel  25 mg every night has been helpful  UPDATE May 20 2021: He is accompanied by his wife at today's visit, she works from  home, patient was noted to have gradual worsening gait abnormality, memory loss, could not carry on much of household obligations, sitting most of the time, do minor exercise regularly,  He continues to have nocturia, improved by oxybutynin ,  UPDATE October 20 2022: He is accompanied by his wife at today's clinical visit, was seen by optometrist recently, concerning visual change, patient himself does not complain, he become very sedentary, passive, sit down most of the time, wife reported worsening cognitive malfunction, slow reaction, lack of motivations, taking quetiapine  25 every night does help him sleep  Tried dalfampridine  10 mg twice a day, amantadine  did not help his symptoms much,  UPDATE Sept 18 2025: He continued to decline slowly, wife works at home, he attends day program through [employer] assistance 3 times a week, rest of the day, he spent most of time looking at his phone, or TV, still able to transfer using walker, it is more difficult for him to maneuver electronic scooter, tilt over, more forgetful, increased gait abnormality, sometimes difficulty getting up from toilet  Reviewed Repeat MRI of the cervical with without contrast in May 2025, widespread chronic cervical cord  demyelinating plaque, no acute abnormality  MRI of the brain significant generalized atrophy ventriculomegaly, periventricular chronic MS lesion  He had a UTI in July require hospitalization, COVID in August, prolonged cough, lab in September, normal CMP, mild elevation of WBC on CBC  He is taking dafampridine 10 mg twice a day, not sure it helping his walking, suggest wife to taper it off, also on baclofen  10 mg 3 times a day as needed, Cymbalta  60 mg daily, he sleeps well, has a good appetite  PHYSICAL EXAM Vitals:   05/20/21 0956  BP: 133/81  Pulse: 83  Weight: 208 lb 8 oz (94.6 kg)  Height: 5' 11 (1.803 m)   Body mass index is 29.08 kg/m.  Gen: NAD, conversant, well nourised, well groomed                      Cardiovascular: Regular rate rhythm, no peripheral edema, warm, nontender. Eyes: Conjunctivae clear without, dried drainage to lashes   NEUROLOGICAL EXAM:  MENTAL STATUS: Speech/cognition:    Speech is slow, slow reaction time, rely on his wife to provide history    10/20/2022   12:00 PM 05/20/2021   10:00 AM  Montreal Cognitive Assessment   Visuospatial/ Executive (0/5) 1 3  Naming (0/3) 2 3  Attention: Read list of digits (0/2) 2 2  Attention: Read list of letters (0/1) 1 1  Attention: Serial 7 subtraction starting at 100 (0/3) 0 0  Language: Repeat phrase (0/2) 1 2  Language : Fluency (0/1) 0 0  Abstraction (0/2) 1 2  Delayed Recall (0/5) 0 0  Orientation (0/6) 1 3  Total 9 16    CRANIAL NERVES: CN III, IV, VI: extraocular movement are normal. No ptosis. Narrow eye open CN V: Facial sensation is intact CN VII: Face is symmetric with normal eye closure and smile. CN VIII: Hearing is normal to casual conversation CN IX, X: Phonation is normal. CN XI: Head turning and shoulder shrug are intact  MOTOR: Moderate bilateral lower extremity spasticity, right worse than left, mild right upper extremity weakness,    REFLEXES: Reflexes are hyperreflexia, and symmetric at the biceps, triceps, knees, and ankles.  SENSORY: Decreased soft touch to right leg  COORDINATION: Mild dysmetria with finger-nose-finger bilaterally  GAIT/STANCE: In wheelchair, deferred  REVIEW OF SYSTEMS: Out of a complete 14 system review of symptoms, the patient complains only of the following symptoms, and all other reviewed systems are negative.  See HPI  ALLERGIES: No Known Allergies  HOME MEDICATIONS: Outpatient Medications Prior to Visit  Medication Sig Dispense Refill   baclofen  (LIORESAL ) 10 MG tablet Take 1 tablet (10 mg total) by mouth 3 (three) times daily. 270 tablet 3   Cholecalciferol (VITAMIN D PO) Take by mouth 2 (two) times daily. Takes 2000units daily     dalfampridine  10 MG  TB12 TAKE 1 TABLET BY MOUTH TWICE A DAY 60 tablet 11   DULoxetine  (CYMBALTA ) 60 MG capsule Take 1 capsule (60 mg total) by mouth daily. 90 capsule 3   multivitamin-iron-minerals-folic acid  (CENTRUM) chewable tablet Chew 1 tablet by mouth daily.     omega-3 acid ethyl esters (LOVAZA) 1 G capsule Take 1,000 mg by mouth 2 (two) times daily.     prazosin (MINIPRESS) 5 MG capsule Take 5 mg by mouth at bedtime.     No facility-administered medications prior to visit.    PAST MEDICAL HISTORY: Past Medical History:  Diagnosis Date   Multiple sclerosis (HCC) 04/21/2005  Optic neuritis    Sleep apnea     PAST SURGICAL HISTORY: Past Surgical History:  Procedure Laterality Date   APPENDECTOMY  53 years old   VASECTOMY  08/2013    FAMILY HISTORY: Family History  Problem Relation Age of Onset   High blood pressure Mother    Sleep apnea Mother    High blood pressure Father     SOCIAL HISTORY: Social History   Socioeconomic History   Marital status: Married    Spouse name: [name]   Number of children: 2   Years of education: 12   Highest education level: Not on file  Occupational History    Comment: Disabled  Tobacco Use   Smoking status: Never   Smokeless tobacco: Never  Substance and Sexual Activity   Alcohol  use: No   Drug use: No   Sexual activity: Not on file  Other Topics Concern   Not on file  Social History Narrative   Patient lives at home with his wife Sammi). Patient has two children. Patient is disabled.   Right handed.   Caffeine- one cup daily.   Social Drivers of Corporate investment banker Strain: Low Risk  (11/10/2023)   Received from [facility]   Overall Financial Resource Strain (CARDIA)    How hard is it for you to pay for the very basics like food, housing, medical care, and heating?: Not very hard  Food Insecurity: No Food Insecurity (11/10/2023)   Received from [facility]   Hunger Vital Sign    Within the past 12 months, you worried that  your food would run out before you got the money to buy more.: Never true    Within the past 12 months, the food you bought just didn't last and you didn't have money to get more.: Never true  Transportation Needs: No Transportation Needs (11/10/2023)   Received from [facility] - Transportation    Lack of Transportation (Medical): No    Lack of Transportation (Non-Medical): No  Physical Activity: Inactive (11/10/2023)   Received from [facility]   Exercise Vital Sign    On average, how many days per week do you engage in moderate to strenuous exercise (like a brisk walk)?: 0 days    On average, how many minutes do you engage in exercise at this level?: 10 min  Stress: No Stress Concern Present (11/10/2023)   Received from [facility] - Occupational Stress Questionnaire    Do you feel stress - tense, restless, nervous, or anxious, or unable to sleep at night because your mind is troubled all the time - these days?: Not at all  Social Connections: Socially Integrated (11/10/2023)   Received from [facility]   Social Connection and Isolation Panel    In a typical week, how many times do you talk on the phone with family, friends, or neighbors?: Three times a week    How often do you get together with friends or relatives?: Twice a week    How often do you attend church or religious services?: 1 to 4 times per year    Do you belong to any clubs or organizations such as church groups, unions, fraternal or athletic groups, or school groups?: No    How often do you attend meetings of the clubs or organizations you belong to?: 1 to 4 times per year    Are you married,  widowed, divorced, separated, never married, or living with a partner?: Married  Intimate Partner Violence: Not At Risk (11/10/2023)   Received from [facility]   Humiliation, Afraid, Rape, and Kick questionnaire    Within the last year, have you been afraid of  your partner or ex-partner?: No    Within the last year, have you been humiliated or emotionally abused in other ways by your partner or ex-partner?: No    Within the last year, have you been kicked, hit, slapped, or otherwise physically hurt by your partner or ex-partner?: No    Within the last year, have you been raped or forced to have any kind of sexual activity by your partner or ex-partner?: No    Modena Callander. M.D. Ph.D.

## 2024-01-15 DIAGNOSIS — R1011 Right upper quadrant pain: Secondary | ICD-10-CM | POA: Diagnosis not present

## 2024-01-15 DIAGNOSIS — S3981XA Other specified injuries of abdomen, initial encounter: Secondary | ICD-10-CM | POA: Diagnosis not present

## 2024-01-15 DIAGNOSIS — S3983XA Other specified injuries of pelvis, initial encounter: Secondary | ICD-10-CM | POA: Diagnosis not present

## 2024-01-15 DIAGNOSIS — J9811 Atelectasis: Secondary | ICD-10-CM | POA: Diagnosis not present

## 2024-01-15 DIAGNOSIS — Z20822 Contact with and (suspected) exposure to covid-19: Secondary | ICD-10-CM | POA: Diagnosis not present

## 2024-01-15 DIAGNOSIS — R079 Chest pain, unspecified: Secondary | ICD-10-CM | POA: Diagnosis not present

## 2024-01-19 DIAGNOSIS — Z515 Encounter for palliative care: Secondary | ICD-10-CM | POA: Diagnosis not present

## 2024-01-19 DIAGNOSIS — Z87440 Personal history of urinary (tract) infections: Secondary | ICD-10-CM | POA: Diagnosis not present

## 2024-01-19 DIAGNOSIS — R296 Repeated falls: Secondary | ICD-10-CM | POA: Diagnosis not present

## 2024-01-19 DIAGNOSIS — G35 Multiple sclerosis: Secondary | ICD-10-CM | POA: Diagnosis not present

## 2024-01-19 DIAGNOSIS — M6281 Muscle weakness (generalized): Secondary | ICD-10-CM | POA: Diagnosis not present

## 2024-01-19 DIAGNOSIS — R5383 Other fatigue: Secondary | ICD-10-CM | POA: Diagnosis not present

## 2024-01-21 DIAGNOSIS — N390 Urinary tract infection, site not specified: Secondary | ICD-10-CM | POA: Diagnosis not present

## 2024-01-21 DIAGNOSIS — I10 Essential (primary) hypertension: Secondary | ICD-10-CM | POA: Diagnosis not present

## 2024-01-21 DIAGNOSIS — Z6830 Body mass index (BMI) 30.0-30.9, adult: Secondary | ICD-10-CM | POA: Diagnosis not present

## 2024-01-22 ENCOUNTER — Ambulatory Visit: Admitting: Urology

## 2024-01-22 ENCOUNTER — Encounter: Payer: Self-pay | Admitting: Urology

## 2024-01-22 VITALS — BP 126/75 | HR 105

## 2024-01-22 DIAGNOSIS — N3941 Urge incontinence: Secondary | ICD-10-CM | POA: Diagnosis not present

## 2024-01-22 DIAGNOSIS — N390 Urinary tract infection, site not specified: Secondary | ICD-10-CM

## 2024-01-22 LAB — BLADDER SCAN AMB NON-IMAGING: Scan Result: 106

## 2024-01-22 MED ORDER — ALFUZOSIN HCL ER 10 MG PO TB24: 10.0000 mg | ORAL_TABLET | Freq: Every evening | ORAL | 11 refills | Status: DC

## 2024-01-22 NOTE — Patient Instructions (Signed)

## 2024-01-22 NOTE — Progress Notes (Signed)
 Bladder Scan completed today.  Patient cannot void prior to the bladder scan. Bladder scan result: 106  Performed By: [facility] LPN

## 2024-01-22 NOTE — Progress Notes (Signed)
 01/22/2024 10:38 AM   Theadore Nap [birth year] [phone]  Referring provider: Toribio Jerel MATSU, MD [address] [place],  [place] [address]  Urinary incontinence   HPI: Mr Palma is a 52yo here for evaluation of frequent UTI and urinary incontinence. He has had MS for 18 years. He has poor mobility. For the past 3-4 years he has noted increased urinary incontinence and nocturnal enuresis. His urine stream is fair. He has intermittent straining to urinate. He uses 4-5 pads per day and soaks 3-4 pads at night.    PMH: Past Medical History:  Diagnosis Date   Multiple sclerosis 04/21/2005   Optic neuritis    Sleep apnea     Surgical History: Past Surgical History:  Procedure Laterality Date   APPENDECTOMY  53 years old   VASECTOMY  08/2013    Home Medications:  Allergies as of 01/22/2024   No Known Allergies      Medication List        Accurate as of January 22, 2024 10:38 AM. If you have any questions, ask your nurse or doctor.          baclofen  10 MG tablet Commonly known as: LIORESAL  Take 1 tablet (10 mg total) by mouth 3 (three) times daily.   dalfampridine  10 MG Tb12 Take by mouth.   DULoxetine  60 MG capsule Commonly known as: CYMBALTA  Take 1 capsule (60 mg total) by mouth daily.   metoprolol succinate 25 MG 24 hr tablet Commonly known as: TOPROL-XL Take 25 mg by mouth daily.   multivitamin-iron-minerals-folic acid  chewable tablet Chew 1 tablet by mouth daily.   omega-3 acid ethyl esters 1 g capsule Commonly known as: LOVAZA Take 1,000 mg by mouth 2 (two) times daily.   prazosin 5 MG capsule Commonly known as: MINIPRESS Take 5 mg by mouth at bedtime.   terbinafine 250 MG tablet Commonly known as: LAMISIL Take 250 mg by mouth daily.   VITAMIN D PO Take by mouth 2 (two) times daily. Takes 2000units daily        Allergies: No Known Allergies  Family History: Family History  Problem Relation Age of Onset   High blood pressure Mother     Sleep apnea Mother    High blood pressure Father     Social History:  reports that he has never smoked. He has never used smokeless tobacco. He reports that he does not drink alcohol  and does not use drugs.  ROS: All other review of systems were reviewed and are negative except what is noted above in HPI  Physical Exam: BP 126/75   Pulse (!) 105   Constitutional:  Alert and oriented, No acute distress. HEENT: [place] AT, moist mucus membranes.  Trachea midline, no masses. Cardiovascular: No clubbing, cyanosis, or edema. Respiratory: Normal respiratory effort, no increased work of breathing. GI: Abdomen is soft, nontender, nondistended, no abdominal masses GU: No CVA tenderness.  Lymph: No cervical or inguinal lymphadenopathy. Skin: No rashes, bruises or suspicious lesions. Neurologic: Grossly intact, no focal deficits, moving all 4 extremities. Psychiatric: Normal mood and affect.  Laboratory Data: Lab Results  Component Value Date   WBC 4.8 04/04/2022   HGB 13.8 04/04/2022   HCT 42.4 04/04/2022   MCV 91.8 04/04/2022   PLT 271 04/04/2022    Lab Results  Component Value Date   CREATININE 0.93 04/04/2022    No results found for: PSA  No results found for: TESTOSTERONE  No results found for: HGBA1C  Urinalysis  Component Value Date/Time   COLORURINE YELLOW 04/04/2022 1056   APPEARANCEUR HAZY (A) 04/04/2022 1056   APPEARANCEUR Clear 10/15/2015 1638   LABSPEC 1.023 04/04/2022 1056   PHURINE 5.0 04/04/2022 1056   GLUCOSEU NEGATIVE 04/04/2022 1056   HGBUR NEGATIVE 04/04/2022 1056   BILIRUBINUR NEGATIVE 04/04/2022 1056   BILIRUBINUR Negative 10/15/2015 1638   KETONESUR 5 (A) 04/04/2022 1056   PROTEINUR NEGATIVE 04/04/2022 1056   NITRITE NEGATIVE 04/04/2022 1056   LEUKOCYTESUR NEGATIVE 04/04/2022 1056    Lab Results  Component Value Date   LABMICR Comment 10/15/2015    Pertinent Imaging:  No results found for this or any previous visit.  No results  found for this or any previous visit.  No results found for this or any previous visit.  No results found for this or any previous visit.  No results found for this or any previous visit.  No results found for this or any previous visit.  No results found for this or any previous visit.  No results found for this or any previous visit.   Assessment & Plan:    1. Urinary tract infection without hematuria, site unspecified (Primary) -likely related to incomplete emptying  - BLADDER SCAN AMB NON-IMAGING - Urinalysis, Routine w reflex microscopic  2. Urge incontinence of urine -uroxatral 10mg  qhs   No follow-ups on file.  Belvie Clara, MD  [facility] [place]

## 2024-03-09 ENCOUNTER — Other Ambulatory Visit: Payer: Self-pay

## 2024-03-29 DIAGNOSIS — Z515 Encounter for palliative care: Secondary | ICD-10-CM | POA: Diagnosis not present

## 2024-03-29 DIAGNOSIS — M6281 Muscle weakness (generalized): Secondary | ICD-10-CM | POA: Diagnosis not present

## 2024-03-29 DIAGNOSIS — R296 Repeated falls: Secondary | ICD-10-CM | POA: Diagnosis not present

## 2024-03-29 DIAGNOSIS — R41841 Cognitive communication deficit: Secondary | ICD-10-CM | POA: Diagnosis not present

## 2024-03-29 DIAGNOSIS — R5383 Other fatigue: Secondary | ICD-10-CM | POA: Diagnosis not present

## 2024-03-29 DIAGNOSIS — G35A Relapsing-remitting multiple sclerosis: Secondary | ICD-10-CM | POA: Diagnosis not present

## 2024-03-29 DIAGNOSIS — R2689 Other abnormalities of gait and mobility: Secondary | ICD-10-CM | POA: Diagnosis not present

## 2024-04-05 ENCOUNTER — Other Ambulatory Visit (HOSPITAL_COMMUNITY): Payer: Self-pay | Admitting: Neurology

## 2024-04-05 DIAGNOSIS — G35D Multiple sclerosis, unspecified: Secondary | ICD-10-CM

## 2024-04-18 ENCOUNTER — Ambulatory Visit (HOSPITAL_COMMUNITY)
Admission: RE | Admit: 2024-04-18 | Discharge: 2024-04-18 | Disposition: A | Discharge status: Home / Self Care | Source: Ambulatory Visit | Attending: Neurology | Admitting: Neurology

## 2024-04-18 DIAGNOSIS — G35D Multiple sclerosis, unspecified: Secondary | ICD-10-CM | POA: Insufficient documentation

## 2024-04-18 LAB — CSF CELL COUNT WITH DIFFERENTIAL
Eosinophils, CSF: 1 % (ref 0–1)
Eosinophils, CSF: 4 % — ABNORMAL HIGH (ref 0–1)
Lymphs, CSF: 86 % — ABNORMAL HIGH (ref 40–80)
Lymphs, CSF: 42 % (ref 40–80)
Monocyte-Macrophage-Spinal Fluid: 3 % — ABNORMAL LOW (ref 15–45)
Monocyte-Macrophage-Spinal Fluid: 4 % — ABNORMAL LOW (ref 15–45)
RBC Count, CSF: 4000 /mm3 — ABNORMAL HIGH
RBC Count, CSF: 22500 /mm3 — ABNORMAL HIGH
Segmented Neutrophils-CSF: 10 % — ABNORMAL HIGH (ref 0–6)
Segmented Neutrophils-CSF: 49 % — ABNORMAL HIGH (ref 0–6)
Tube #: 4
Tube #: 1
WBC, CSF: 32 /mm3 (ref 0–5)
WBC, CSF: 22 /mm3 (ref 0–5)

## 2024-04-18 LAB — PROTEIN AND GLUCOSE, CSF
Glucose, CSF: 62 mg/dL (ref 40–70)
Total  Protein, CSF: 228 mg/dL — ABNORMAL HIGH (ref 15–45)

## 2024-04-18 MED ORDER — LIDOCAINE 1 % OPTIME INJ - NO CHARGE
5.0000 mL | Freq: Once | INTRAMUSCULAR | Status: AC
  Administered 2024-04-18: 8 mL via INTRADERMAL
  Filled 2024-04-18: qty 6

## 2024-04-18 MED ORDER — ACETAMINOPHEN 325 MG PO TABS: 650.0000 mg | ORAL_TABLET | ORAL | Status: DC | PRN

## 2024-04-18 NOTE — Progress Notes (Signed)
 Discharge instructions reviewed with patient and care giver at the bedside. Denies questions or concerns. No s/s of complications at the incision site. PT was able to tolerate PO intake. PT was not able to ambulate d/t baseline condition. PT escorted from the unit via personal wheel chair to personal vehicle.

## 2024-04-18 NOTE — Procedures (Signed)
 PROCEDURE SUMMARY:  Successful fluoroscopic guided lumbar puncture.  Opening pressure was 15 cm H2O ~9 mL blood tinged fluid which cleared during collection to clear colorless fluid collected and sent for labs.  No immediate complications.  Pt tolerated well.   EBL = <1 cc  Please see full dictation in imaging section of Epic for procedure details.    Joanne Salah NP 04/18/2024 9:46 AM

## 2024-04-19 LAB — PATHOLOGIST SMEAR REVIEW

## 2024-04-20 LAB — JC VIRUS, PCR CSF: JC Virus PCR, CSF: NEGATIVE

## 2024-05-27 ENCOUNTER — Ambulatory Visit: Admitting: Urology

## 2024-05-27 VITALS — BP 99/62 | HR 73

## 2024-05-27 DIAGNOSIS — N3941 Urge incontinence: Secondary | ICD-10-CM

## 2024-05-27 DIAGNOSIS — N390 Urinary tract infection, site not specified: Secondary | ICD-10-CM

## 2024-05-27 LAB — BLADDER SCAN AMB NON-IMAGING: Scan Result: 163

## 2024-05-27 MED ORDER — SILODOSIN 8 MG PO CAPS: 8.0000 mg | ORAL_CAPSULE | Freq: Every evening | ORAL | 11 refills | Status: AC

## 2024-05-27 NOTE — Progress Notes (Unsigned)
" °  Patient cannot void prior to the bladder scan. Bladder scan result: 163  Performed By: [facility] LPN   "

## 2024-05-27 NOTE — Progress Notes (Unsigned)
 "  05/27/2024 12:16 PM   Lawrence Lamb [birth year] [id number]  Referring provider: Job Bolt, PA [address] [place],  [place] [address]  Followup incomplete emptying and UTI   HPI: Lawrence Lamb is a 53yo here for followup for incomplete emptying and frequent UTI. NO UTIs since last visit. PVR 163 on uroxatral  10mg  at bedtime. IPSS 2 QOL 0 on uroxatral  10mg  at bedtime. He drinks 5-6 bottles of water daily. He uses 4-5 diapers per day   PMH: Past Medical History:  Diagnosis Date   Multiple sclerosis 04/21/2005   Optic neuritis    Sleep apnea     Surgical History: Past Surgical History:  Procedure Laterality Date   APPENDECTOMY  54 years old   VASECTOMY  08/2013    Home Medications:  Allergies as of 05/27/2024   No Known Allergies      Medication List        Accurate as of May 27, 2024 12:16 PM. If you have any questions, ask your nurse or doctor.          alfuzosin  10 MG 24 hr tablet Commonly known as: UROXATRAL  Take 1 tablet (10 mg total) by mouth at bedtime.   baclofen  10 MG tablet Commonly known as: LIORESAL  Take 1 tablet (10 mg total) by mouth 3 (three) times daily.   dalfampridine  10 MG Tb12 Take by mouth.   DULoxetine  60 MG capsule Commonly known as: CYMBALTA  Take 1 capsule (60 mg total) by mouth daily.   metoprolol succinate 25 MG 24 hr tablet Commonly known as: TOPROL-XL Take 25 mg by mouth daily.   multivitamin-iron-minerals-folic acid  chewable tablet Chew 1 tablet by mouth daily.   omega-3 acid ethyl esters 1 g capsule Commonly known as: LOVAZA Take 1,000 mg by mouth 2 (two) times daily.   prazosin 5 MG capsule Commonly known as: MINIPRESS Take 5 mg by mouth at bedtime.   terbinafine 250 MG tablet Commonly known as: LAMISIL Take 250 mg by mouth daily.   VITAMIN D PO Take by mouth 2 (two) times daily. Takes 2000units daily        Allergies: Allergies[1]  Family History: Family History  Problem Relation Age of Onset    High blood pressure Mother    Sleep apnea Mother    High blood pressure Father     Social History:  reports that he has never smoked. He has never used smokeless tobacco. He reports that he does not drink alcohol  and does not use drugs.  ROS: All other review of systems were reviewed and are negative except what is noted above in HPI  Physical Exam: BP 99/62   Pulse 73   Constitutional:  Alert and oriented, No acute distress. HEENT: [place] AT, moist mucus membranes.  Trachea midline, no masses. Cardiovascular: No clubbing, cyanosis, or edema. Respiratory: Normal respiratory effort, no increased work of breathing. GI: Abdomen is soft, nontender, nondistended, no abdominal masses GU: No CVA tenderness.  Lymph: No cervical or inguinal lymphadenopathy. Skin: No rashes, bruises or suspicious lesions. Neurologic: Grossly intact, no focal deficits, moving all 4 extremities. Psychiatric: Normal mood and affect.  Laboratory Data: Lab Results  Component Value Date   WBC 4.8 04/04/2022   HGB 13.8 04/04/2022   HCT 42.4 04/04/2022   MCV 91.8 04/04/2022   PLT 271 04/04/2022    Lab Results  Component Value Date   CREATININE 0.93 04/04/2022    No results found for: PSA  No results found for: TESTOSTERONE  No results  found for: HGBA1C  Urinalysis    Component Value Date/Time   COLORURINE YELLOW 04/04/2022 1056   APPEARANCEUR HAZY (A) 04/04/2022 1056   APPEARANCEUR Clear 10/15/2015 1638   LABSPEC 1.023 04/04/2022 1056   PHURINE 5.0 04/04/2022 1056   GLUCOSEU NEGATIVE 04/04/2022 1056   HGBUR NEGATIVE 04/04/2022 1056   BILIRUBINUR NEGATIVE 04/04/2022 1056   BILIRUBINUR Negative 10/15/2015 1638   KETONESUR 5 (A) 04/04/2022 1056   PROTEINUR NEGATIVE 04/04/2022 1056   NITRITE NEGATIVE 04/04/2022 1056   LEUKOCYTESUR NEGATIVE 04/04/2022 1056    Lab Results  Component Value Date   LABMICR Comment 10/15/2015    Pertinent Imaging: *** No results found for this or any  previous visit.  No results found for this or any previous visit.  No results found for this or any previous visit.  No results found for this or any previous visit.  No results found for this or any previous visit.  No results found for this or any previous visit.  No results found for this or any previous visit.  No results found for this or any previous visit.   Assessment & Plan:    1. Urinary tract infection without hematuria, site unspecified (Primary) *** - Urinalysis, Routine w reflex microscopic - BLADDER SCAN AMB NON-IMAGING  2. Urge incontinence of urine ***   No follow-ups on file.  Belvie Clara, MD  [facility] [place]      [1] No Known Allergies  "

## 2024-12-02 ENCOUNTER — Ambulatory Visit: Admitting: Urology
